# Patient Record
Sex: Male | Born: 1935 | Race: White | Hispanic: No | Marital: Married | Smoking: Former smoker
Health system: Southern US, Community
[De-identification: ages and names within clinical notes are randomized; demographics above are authoritative.]

## PROBLEM LIST (undated history)

## (undated) DIAGNOSIS — I219 Acute myocardial infarction, unspecified: Secondary | ICD-10-CM

## (undated) DIAGNOSIS — I10 Essential (primary) hypertension: Secondary | ICD-10-CM

## (undated) DIAGNOSIS — E119 Type 2 diabetes mellitus without complications: Secondary | ICD-10-CM

## (undated) DIAGNOSIS — I2699 Other pulmonary embolism without acute cor pulmonale: Secondary | ICD-10-CM

## (undated) DIAGNOSIS — I82409 Acute embolism and thrombosis of unspecified deep veins of unspecified lower extremity: Secondary | ICD-10-CM

## (undated) HISTORY — PX: REPLACEMENT TOTAL KNEE BILATERAL: SUR1225

## (undated) HISTORY — PX: VENA CAVA FILTER PLACEMENT: SHX1085

## (undated) HISTORY — PX: CHOLECYSTECTOMY: SHX55

---

## 2012-06-22 ENCOUNTER — Encounter (HOSPITAL_BASED_OUTPATIENT_CLINIC_OR_DEPARTMENT_OTHER): Payer: Self-pay | Admitting: Emergency Medicine

## 2012-06-22 ENCOUNTER — Emergency Department (HOSPITAL_BASED_OUTPATIENT_CLINIC_OR_DEPARTMENT_OTHER)
Admission: EM | Admit: 2012-06-22 | Discharge: 2012-06-22 | Disposition: A | Discharge status: Home / Self Care | Payer: Medicare PPO | Attending: Emergency Medicine | Admitting: Emergency Medicine

## 2012-06-22 ENCOUNTER — Emergency Department (HOSPITAL_BASED_OUTPATIENT_CLINIC_OR_DEPARTMENT_OTHER): Payer: Medicare PPO

## 2012-06-22 DIAGNOSIS — Z87891 Personal history of nicotine dependence: Secondary | ICD-10-CM | POA: Insufficient documentation

## 2012-06-22 DIAGNOSIS — Y939 Activity, unspecified: Secondary | ICD-10-CM | POA: Insufficient documentation

## 2012-06-22 DIAGNOSIS — Z7982 Long term (current) use of aspirin: Secondary | ICD-10-CM | POA: Insufficient documentation

## 2012-06-22 DIAGNOSIS — Y929 Unspecified place or not applicable: Secondary | ICD-10-CM | POA: Insufficient documentation

## 2012-06-22 DIAGNOSIS — S99919A Unspecified injury of unspecified ankle, initial encounter: Secondary | ICD-10-CM | POA: Insufficient documentation

## 2012-06-22 DIAGNOSIS — I10 Essential (primary) hypertension: Secondary | ICD-10-CM | POA: Insufficient documentation

## 2012-06-22 DIAGNOSIS — W19XXXA Unspecified fall, initial encounter: Secondary | ICD-10-CM

## 2012-06-22 DIAGNOSIS — I252 Old myocardial infarction: Secondary | ICD-10-CM | POA: Insufficient documentation

## 2012-06-22 DIAGNOSIS — S8990XA Unspecified injury of unspecified lower leg, initial encounter: Secondary | ICD-10-CM | POA: Insufficient documentation

## 2012-06-22 DIAGNOSIS — W1809XA Striking against other object with subsequent fall, initial encounter: Secondary | ICD-10-CM | POA: Insufficient documentation

## 2012-06-22 DIAGNOSIS — E119 Type 2 diabetes mellitus without complications: Secondary | ICD-10-CM | POA: Insufficient documentation

## 2012-06-22 DIAGNOSIS — S6990XA Unspecified injury of unspecified wrist, hand and finger(s), initial encounter: Secondary | ICD-10-CM | POA: Insufficient documentation

## 2012-06-22 DIAGNOSIS — Z79899 Other long term (current) drug therapy: Secondary | ICD-10-CM | POA: Insufficient documentation

## 2012-06-22 HISTORY — DX: Essential (primary) hypertension: I10

## 2012-06-22 HISTORY — DX: Type 2 diabetes mellitus without complications: E11.9

## 2012-06-22 HISTORY — DX: Acute myocardial infarction, unspecified: I21.9

## 2012-06-22 LAB — PROTIME-INR: Prothrombin Time: 19.9 seconds — ABNORMAL HIGH (ref 11.6–15.2)

## 2012-06-22 IMAGING — CR DG HAND COMPLETE 3+V*L*
3 series · 3 of 3 positions shown · non-contrast
Comparison: None.

CLINICAL DATA: Fell yesterday and injured the left hand, pain
localizing between the thumb and index finger.

LEFT HAND - COMPLETE 3+ VIEW

[x hand pa left]
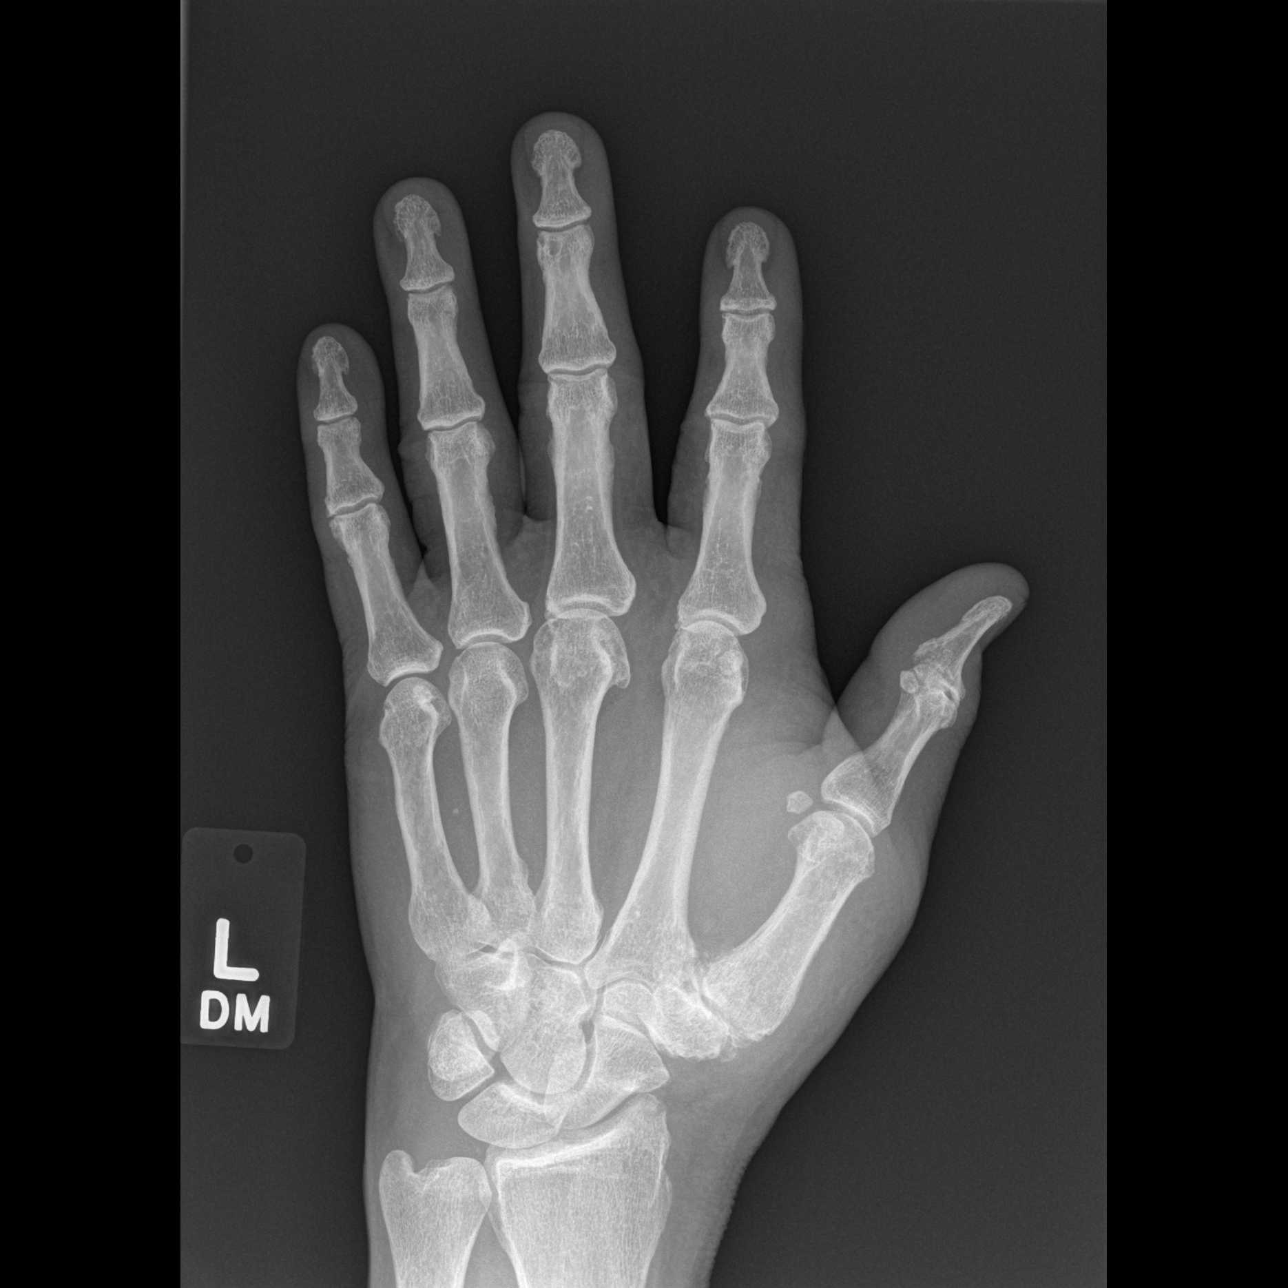

[x hand oblique left]
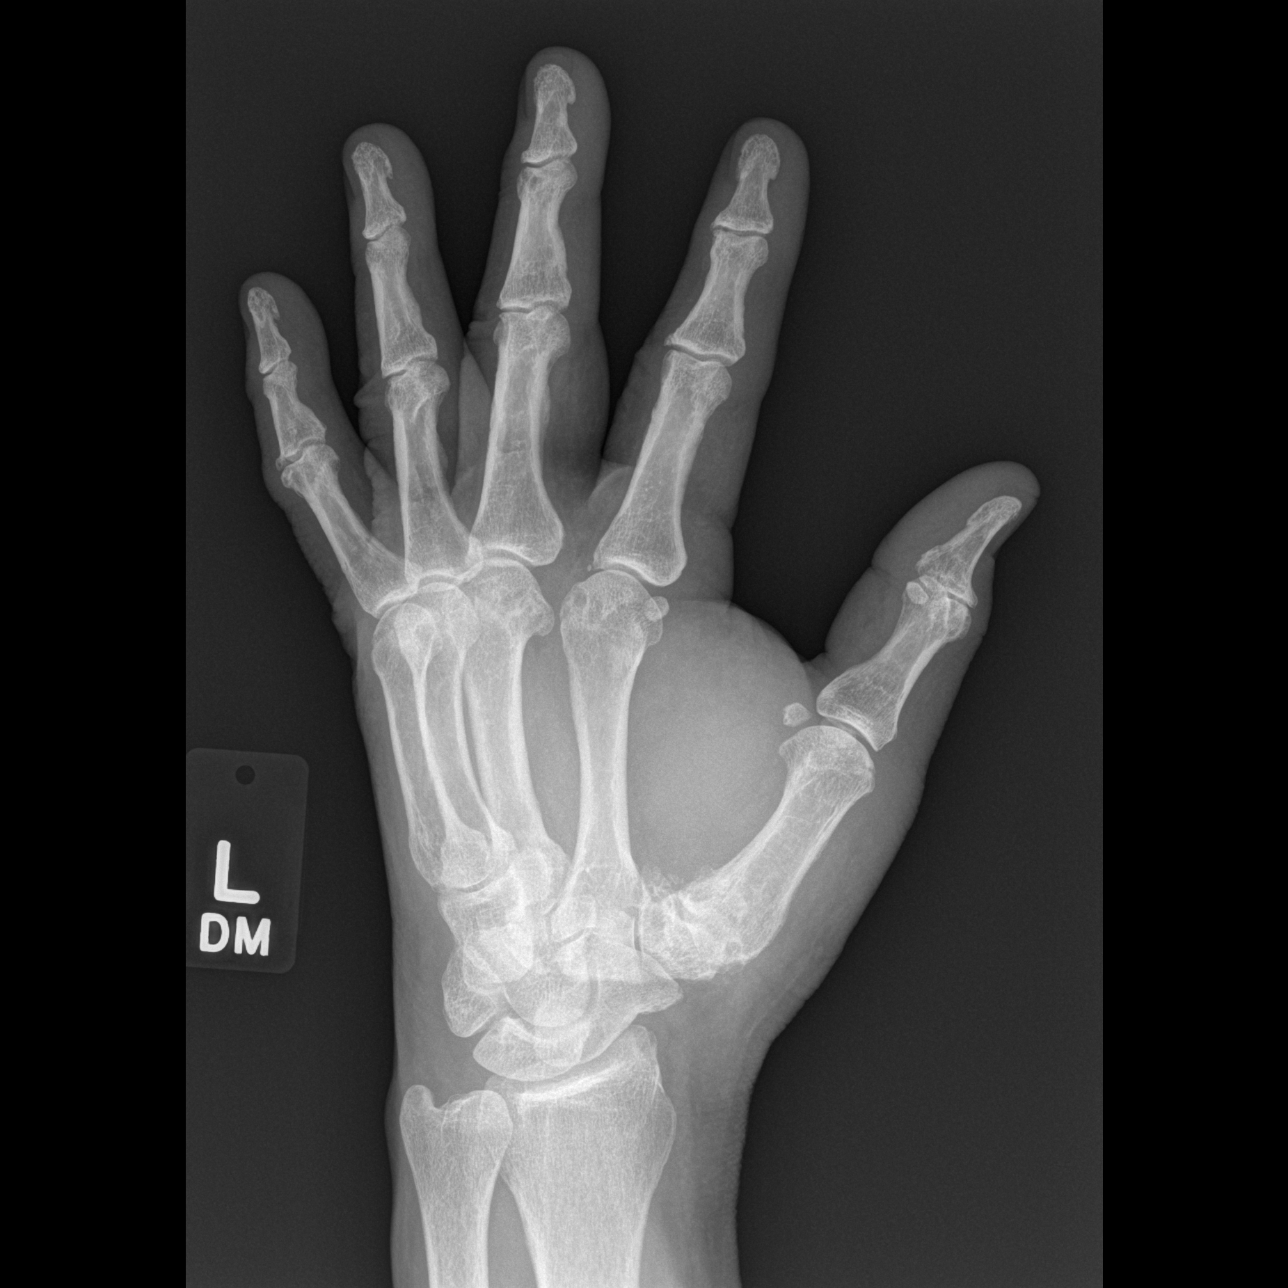

[x hand lat left]
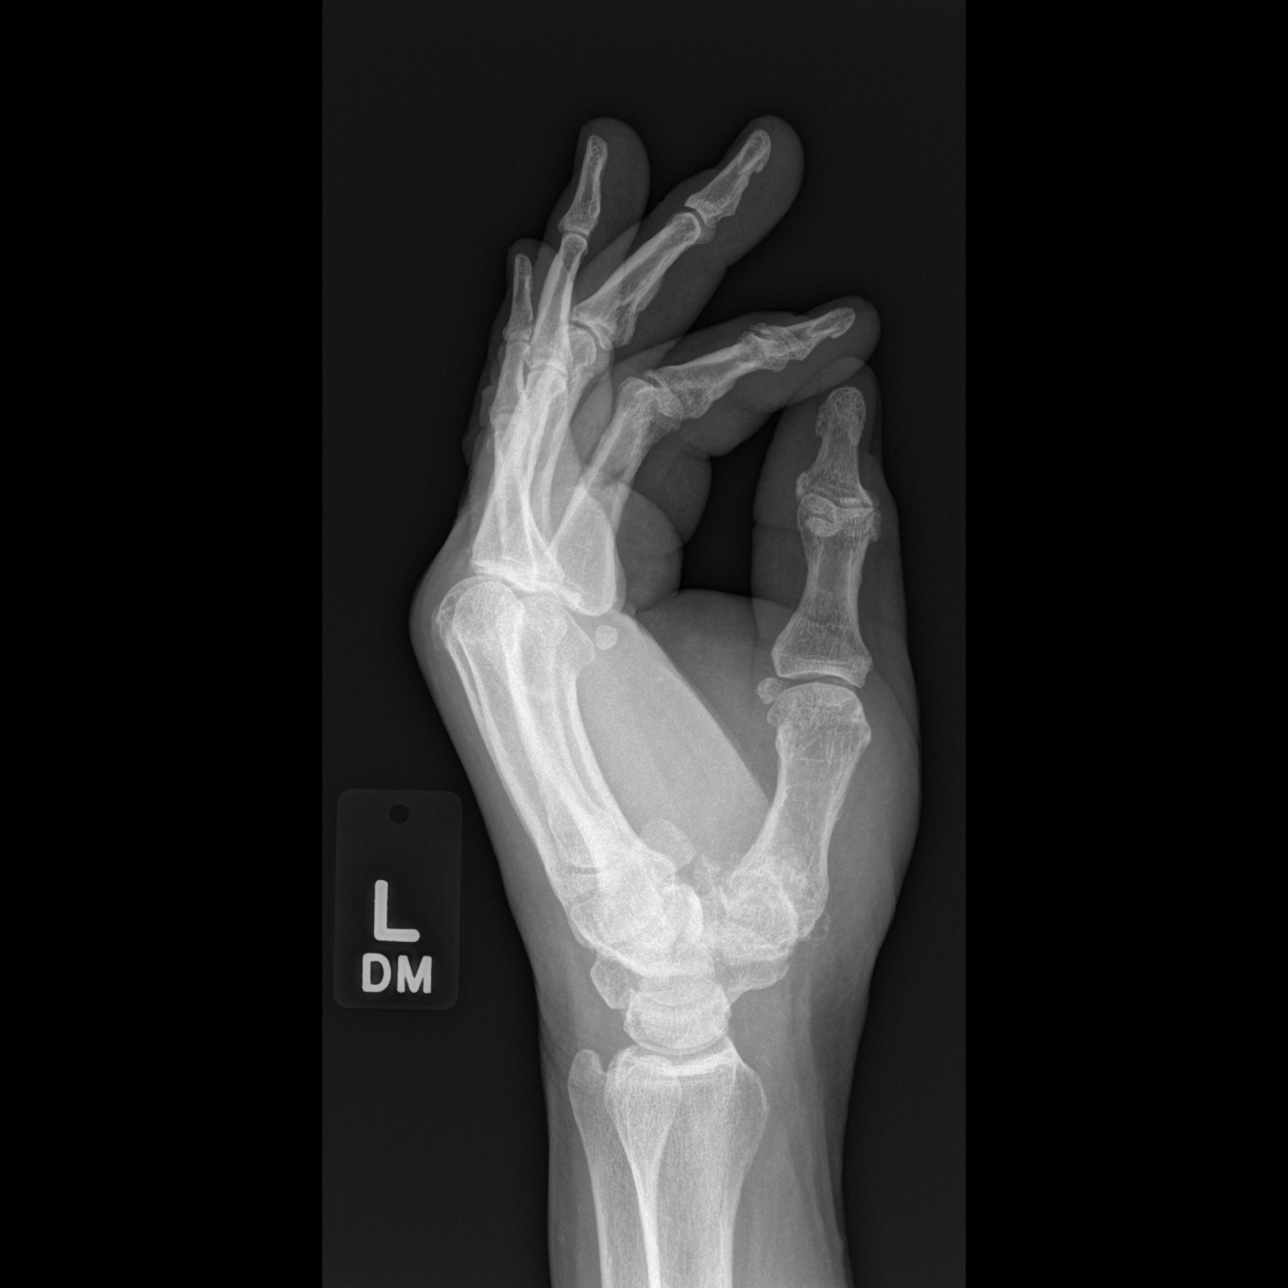

[3 of 3 positions shown; findings below may reference images not displayed]

FINDINGS: Soft tissue swelling involving the hand between the first
and second metacarpals.  No evidence of acute fracture or
dislocation.  Mild joint space narrowing involving multiple IP
joints in the fingers and all of the MTP joints, and severe joint
space narrowing and hypertrophic changes involving the trapezium -
first metacarpal joint in the wrist.  Bone mineral density well
preserved for age.  No opaque foreign bodies in the soft tissues.
IMPRESSION: No acute osseous abnormality.  Mild osteoarthritis involving the
hand and severe osteoarthritis involving the trapezium - first
metacarpal joint of the wrist.  No opaque foreign bodies.

## 2012-06-22 MED ORDER — BACITRACIN 500 UNIT/GM EX OINT
1.0000 "application " | TOPICAL_OINTMENT | Freq: Two times a day (BID) | CUTANEOUS | Status: DC
  Administered 2012-06-22: 1 via TOPICAL
  Filled 2012-06-22: qty 0.9

## 2012-06-22 NOTE — ED Provider Notes (Signed)
History     CSN: [id number]  Arrival date & time 06/22/12  4098   First MD Initiated Contact with Patient 06/22/12 [phone]      Chief Complaint  Patient presents with  . Fall    (Consider location/radiation/quality/duration/timing/severity/associated sxs/prior treatment) HPI Patient tripped and fell on the uneven surface of his driveway yesterday morning. Since the event he complains of swelling to the left hand, associated with mild pain he also suffered abrasions to both hands, left knee and a swollen area to the left pelvis he denies hitting his head denies abdominal pain denies chest pain no other complaint discomfort is minimal. Nonradiating, worse with movement dull in quality Past Medical History  Diagnosis Date  . Diabetes mellitus without complication   . Hypertension   . MI (myocardial infarction)    DVT Past Surgical History  Procedure Date  . Cholecystectomy   . Replacement total knee bilateral     No family history on file.  History  Substance Use Topics  . Smoking status: Former Games developer  . Smokeless tobacco: Not on file  . Alcohol Use: No      Review of Systems  Musculoskeletal: Positive for joint swelling.       Swelling of left hand   Skin: Positive for wound.       Abrasions    Allergies  Review of patient's allergies indicates not on file.  Home Medications   Current Outpatient Rx  Name  Route  Sig  Dispense  Refill  . ALLOPURINOL 300 MG PO TABS   Oral   Take 300 mg by mouth daily.         . ASPIRIN 81 MG PO TABS   Oral   Take 81 mg by mouth daily.         Marland Kitchen CARVEDILOL 3.125 MG PO TABS   Oral   Take 3.125 mg by mouth 2 (two) times daily with a meal.         . FUROSEMIDE 40 MG PO TABS   Oral   Take 40 mg by mouth daily.         Marland Kitchen GLIPIZIDE ER 2.5 MG PO TB24   Oral   Take 2.5 mg by mouth daily.         Marland Kitchen LOSARTAN POTASSIUM 25 MG PO TABS   Oral   Take 25 mg by mouth daily.         Marland Kitchen METFORMIN HCL 1000 MG PO TABS  Oral   Take 1,000 mg by mouth 2 (two) times daily with a meal.         . METOLAZONE 2.5 MG PO TABS   Oral   Take 2.5 mg by mouth daily.         Marland Kitchen POTASSIUM CHLORIDE 20 MEQ PO PACK   Oral   Take 20 mEq by mouth 2 (two) times daily.         Marland Kitchen SIMVASTATIN 20 MG PO TABS   Oral   Take 20 mg by mouth every evening.         . WARFARIN SODIUM 5 MG PO TABS   Oral   Take 5 mg by mouth daily.           BP 116/83  Pulse 93  Temp 98.1 F (36.7 C) (Oral)  Resp 18  SpO2 96%  Physical Exam  Nursing note and vitals reviewed. Constitutional: He is oriented to person, place, and time. He appears well-developed and well-nourished.  HENT:  Head: Normocephalic and atraumatic.  Eyes: Conjunctivae normal are normal. Pupils are equal, round, and reactive to light.  Neck: Neck supple. No tracheal deviation present. No thyromegaly present.  Cardiovascular: Normal rate and regular rhythm.   No murmur heard. Pulmonary/Chest: Effort normal and breath sounds normal.  Abdominal: Soft. Bowel sounds are normal. He exhibits no distension. There is no tenderness.  Musculoskeletal: Normal range of motion. He exhibits no edema and no tenderness.       Left upper extremity swollen and ecchymotic at the thenar eminence. There is a 0.5 cm abrasion to the web space between thumb and forefinger the patient has limited opponens motion of his thumb due to swelling of thenar eminence otherwise full range of motion of hand and wrist pelvis is stable nontender minimal swelling at left iliac crest. Left lower extremity there is a 2 cm abrasion to the anterior knee without swelling or tenderness right upper extremity 0.5 cm abrasion to the palmar surface of the hand otherwise atraumatic. Right lower extremity without contusion abrasion or tenderness neurovascularly intact  Neurological: He is alert and oriented to person, place, and time. Coordination normal.       Gait normal  Skin: Skin is warm and dry. No rash  noted.  Psychiatric: He has a normal mood and affect.    ED Course  Procedures (including critical care time)  Labs Reviewed - No data to display No results found. Results for orders placed during the hospital encounter of 06/22/12  PROTIME-INR      Component Value Range   Prothrombin Time 19.9 (*) 11.6 - 15.2 seconds   INR 1.76 (*) 0.00 - 1.49   Dg Hand Complete Left  06/22/2012  *RADIOLOGY REPORT*  Clinical Data: Larey Seat yesterday and injured the left hand, pain localizing between the thumb and index finger.  LEFT HAND - COMPLETE 3+ VIEW  Comparison: None.  Findings: Soft tissue swelling involving the hand between the first and second metacarpals.  No evidence of acute fracture or dislocation.  Mild joint space narrowing involving multiple IP joints in the fingers and all of the MTP joints, and severe joint space narrowing and hypertrophic changes involving the trapezium - first metacarpal joint in the wrist.  Bone mineral density well preserved for age.  No opaque foreign bodies in the soft tissues.  IMPRESSION: No acute osseous abnormality.  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  No opaque foreign bodies.   Original Report Authenticated By: Hulan Saas, M.D.      No diagnosis found.  Declined pain medicine X-ray reviewed by me MDM  Will not splint presently as I feel that will increase in stiffness in left thumb. Plan bacitracin ointment ice, Tylenol as needed for pain. Follow up with primary care physician if needed for referral to orthopedic surgeon or hand specialist, back to normal in one week Diagnosis #1 fall #2 contusions multiple sites #3 abrasions multiple sites #4 subtherapeutic INR        Doug Sou, MD 06/22/12 1023

## 2012-06-22 NOTE — ED Notes (Signed)
Pt sts he tripped and fell yesterday AM, no LOC, swelling and bruising noted to left hand, CMS intact, denies pain when not moving, no meds pta, NAD

## 2012-06-22 NOTE — ED Notes (Signed)
The patient is undressed and in a gown. The bed is locked and in the lowest position the call light is within reach. The family is at the bedside. The bed rails are up.

## 2014-03-15 ENCOUNTER — Emergency Department (HOSPITAL_BASED_OUTPATIENT_CLINIC_OR_DEPARTMENT_OTHER): Payer: Medicare PPO

## 2014-03-15 ENCOUNTER — Emergency Department (HOSPITAL_BASED_OUTPATIENT_CLINIC_OR_DEPARTMENT_OTHER)
Admission: EM | Admit: 2014-03-15 | Discharge: 2014-03-15 | Disposition: A | Discharge status: Home / Self Care | Payer: Medicare PPO | Attending: Emergency Medicine | Admitting: Emergency Medicine

## 2014-03-15 ENCOUNTER — Encounter (HOSPITAL_BASED_OUTPATIENT_CLINIC_OR_DEPARTMENT_OTHER): Payer: Self-pay | Admitting: Emergency Medicine

## 2014-03-15 DIAGNOSIS — Z87891 Personal history of nicotine dependence: Secondary | ICD-10-CM | POA: Diagnosis not present

## 2014-03-15 DIAGNOSIS — I10 Essential (primary) hypertension: Secondary | ICD-10-CM | POA: Insufficient documentation

## 2014-03-15 DIAGNOSIS — W1809XA Striking against other object with subsequent fall, initial encounter: Secondary | ICD-10-CM | POA: Insufficient documentation

## 2014-03-15 DIAGNOSIS — Z86718 Personal history of other venous thrombosis and embolism: Secondary | ICD-10-CM | POA: Insufficient documentation

## 2014-03-15 DIAGNOSIS — E119 Type 2 diabetes mellitus without complications: Secondary | ICD-10-CM | POA: Insufficient documentation

## 2014-03-15 DIAGNOSIS — Z7982 Long term (current) use of aspirin: Secondary | ICD-10-CM | POA: Insufficient documentation

## 2014-03-15 DIAGNOSIS — S99919A Unspecified injury of unspecified ankle, initial encounter: Secondary | ICD-10-CM | POA: Diagnosis present

## 2014-03-15 DIAGNOSIS — S8990XA Unspecified injury of unspecified lower leg, initial encounter: Secondary | ICD-10-CM | POA: Diagnosis present

## 2014-03-15 DIAGNOSIS — Z96659 Presence of unspecified artificial knee joint: Secondary | ICD-10-CM | POA: Insufficient documentation

## 2014-03-15 DIAGNOSIS — I252 Old myocardial infarction: Secondary | ICD-10-CM | POA: Insufficient documentation

## 2014-03-15 DIAGNOSIS — Y9289 Other specified places as the place of occurrence of the external cause: Secondary | ICD-10-CM | POA: Diagnosis not present

## 2014-03-15 DIAGNOSIS — Y9389 Activity, other specified: Secondary | ICD-10-CM | POA: Diagnosis not present

## 2014-03-15 DIAGNOSIS — Z79899 Other long term (current) drug therapy: Secondary | ICD-10-CM | POA: Insufficient documentation

## 2014-03-15 DIAGNOSIS — S8000XA Contusion of unspecified knee, initial encounter: Secondary | ICD-10-CM | POA: Diagnosis not present

## 2014-03-15 DIAGNOSIS — Z7901 Long term (current) use of anticoagulants: Secondary | ICD-10-CM | POA: Diagnosis not present

## 2014-03-15 DIAGNOSIS — S99929A Unspecified injury of unspecified foot, initial encounter: Secondary | ICD-10-CM

## 2014-03-15 DIAGNOSIS — Z86711 Personal history of pulmonary embolism: Secondary | ICD-10-CM | POA: Insufficient documentation

## 2014-03-15 DIAGNOSIS — S8001XA Contusion of right knee, initial encounter: Secondary | ICD-10-CM

## 2014-03-15 HISTORY — DX: Acute embolism and thrombosis of unspecified deep veins of unspecified lower extremity: I82.409

## 2014-03-15 HISTORY — DX: Other pulmonary embolism without acute cor pulmonale: I26.99

## 2014-03-15 LAB — CBC WITH DIFFERENTIAL/PLATELET
BASOS ABS: 0 10*3/uL (ref 0.0–0.1)
Basophils Relative: 0 % (ref 0–1)
Eosinophils Absolute: 0.1 10*3/uL (ref 0.0–0.7)
Eosinophils Relative: 1 % (ref 0–5)
HEMATOCRIT: 42.4 % (ref 39.0–52.0)
Hemoglobin: 14 g/dL (ref 13.0–17.0)
LYMPHS PCT: 14 % (ref 12–46)
Lymphs Abs: 1.2 10*3/uL (ref 0.7–4.0)
MCH: 31.1 pg (ref 26.0–34.0)
MCHC: 33 g/dL (ref 30.0–36.0)
MCV: 94.2 fL (ref 78.0–100.0)
Monocytes Absolute: 0.6 10*3/uL (ref 0.1–1.0)
Monocytes Relative: 7 % (ref 3–12)
NEUTROS ABS: 6.4 10*3/uL (ref 1.7–7.7)
Neutrophils Relative %: 78 % — ABNORMAL HIGH (ref 43–77)
PLATELETS: 173 10*3/uL (ref 150–400)
RBC: 4.5 MIL/uL (ref 4.22–5.81)
RDW: 15 % (ref 11.5–15.5)
WBC: 8.3 10*3/uL (ref 4.0–10.5)

## 2014-03-15 LAB — BASIC METABOLIC PANEL
Anion gap: 12 (ref 5–15)
BUN: 16 mg/dL (ref 6–23)
CO2: 32 mEq/L (ref 19–32)
Calcium: 9.9 mg/dL (ref 8.4–10.5)
Chloride: 96 mEq/L (ref 96–112)
Creatinine, Ser: 0.8 mg/dL (ref 0.50–1.35)
GFR, EST NON AFRICAN AMERICAN: 84 mL/min — AB (ref >90)
Glucose, Bld: 160 mg/dL — ABNORMAL HIGH (ref 70–99)
POTASSIUM: 3.7 meq/L (ref 3.7–5.3)
SODIUM: 140 meq/L (ref 137–147)

## 2014-03-15 LAB — PROTIME-INR
INR: 2.5 — AB (ref 0.00–1.49)
PROTHROMBIN TIME: 27 s — AB (ref 11.6–15.2)

## 2014-03-15 IMAGING — CR DG KNEE COMPLETE 4+V*R*
4 series · 4 of 4 positions shown · non-contrast
Comparison: None.

CLINICAL DATA: KNEE INJURY

EXAM:
RIGHT KNEE - COMPLETE 4+ VIEW

[t knee ap right]
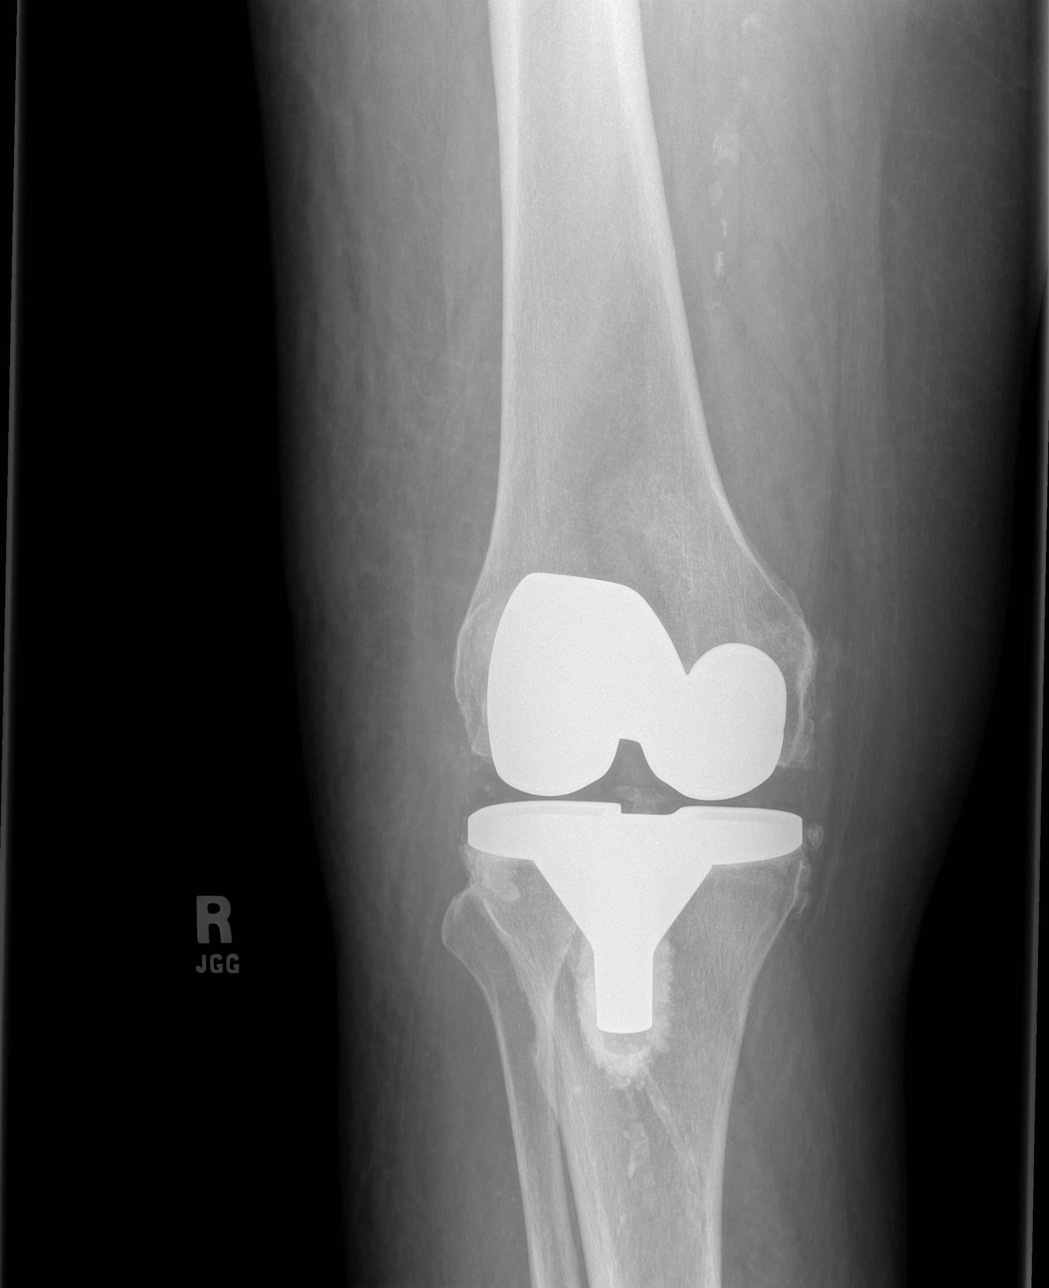

[t knee oblique right (1 of 2)]
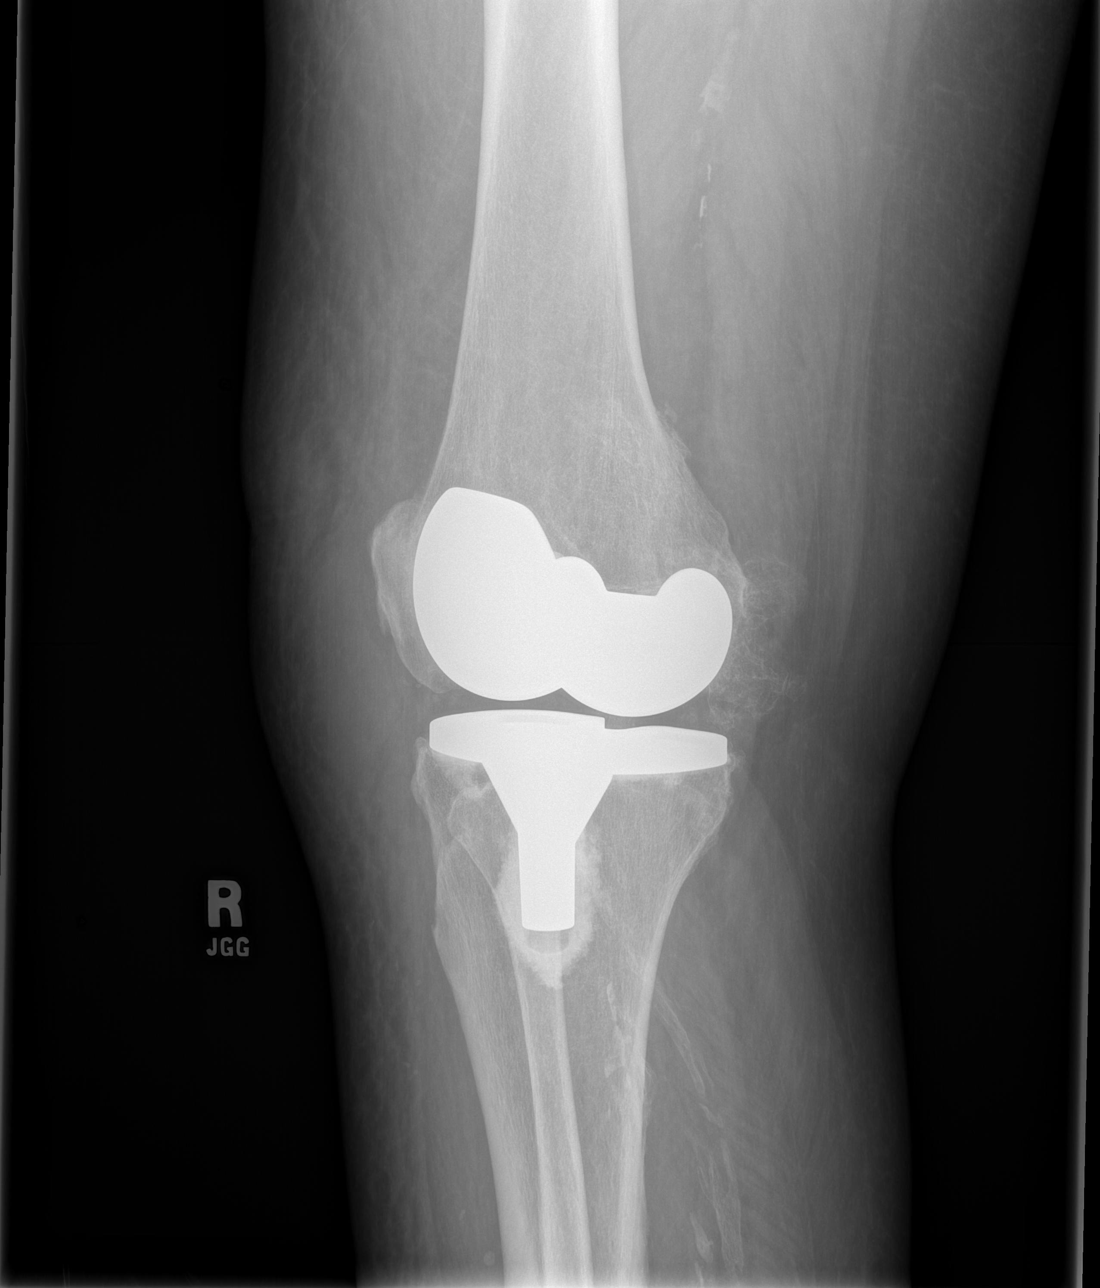

[t knee oblique right (2 of 2)]
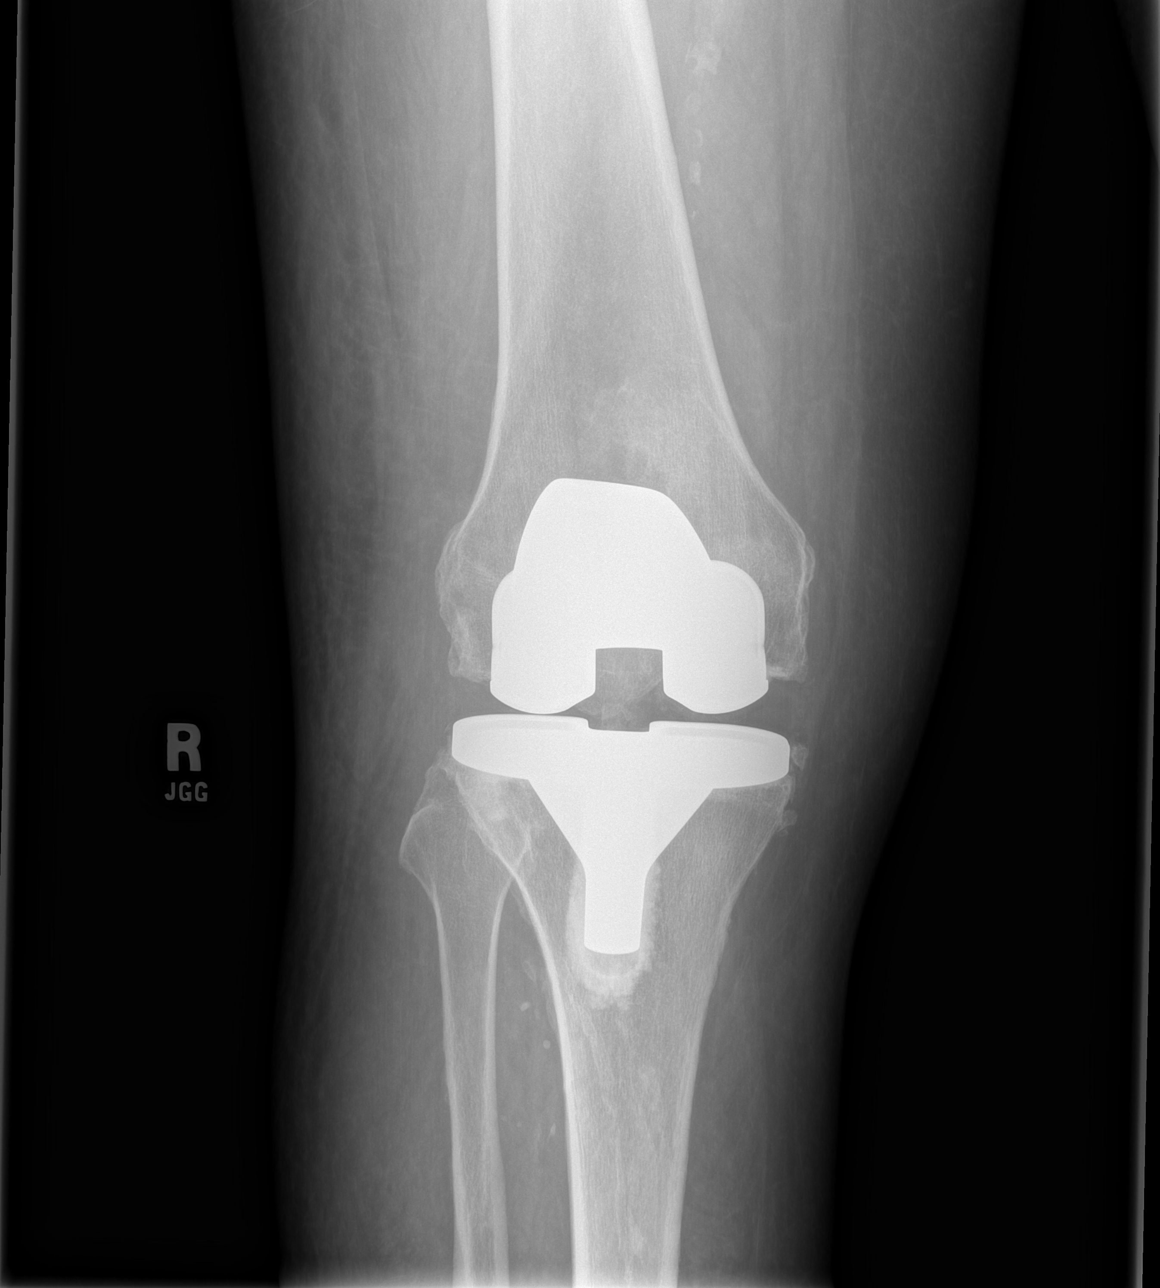

[t knee lat right]
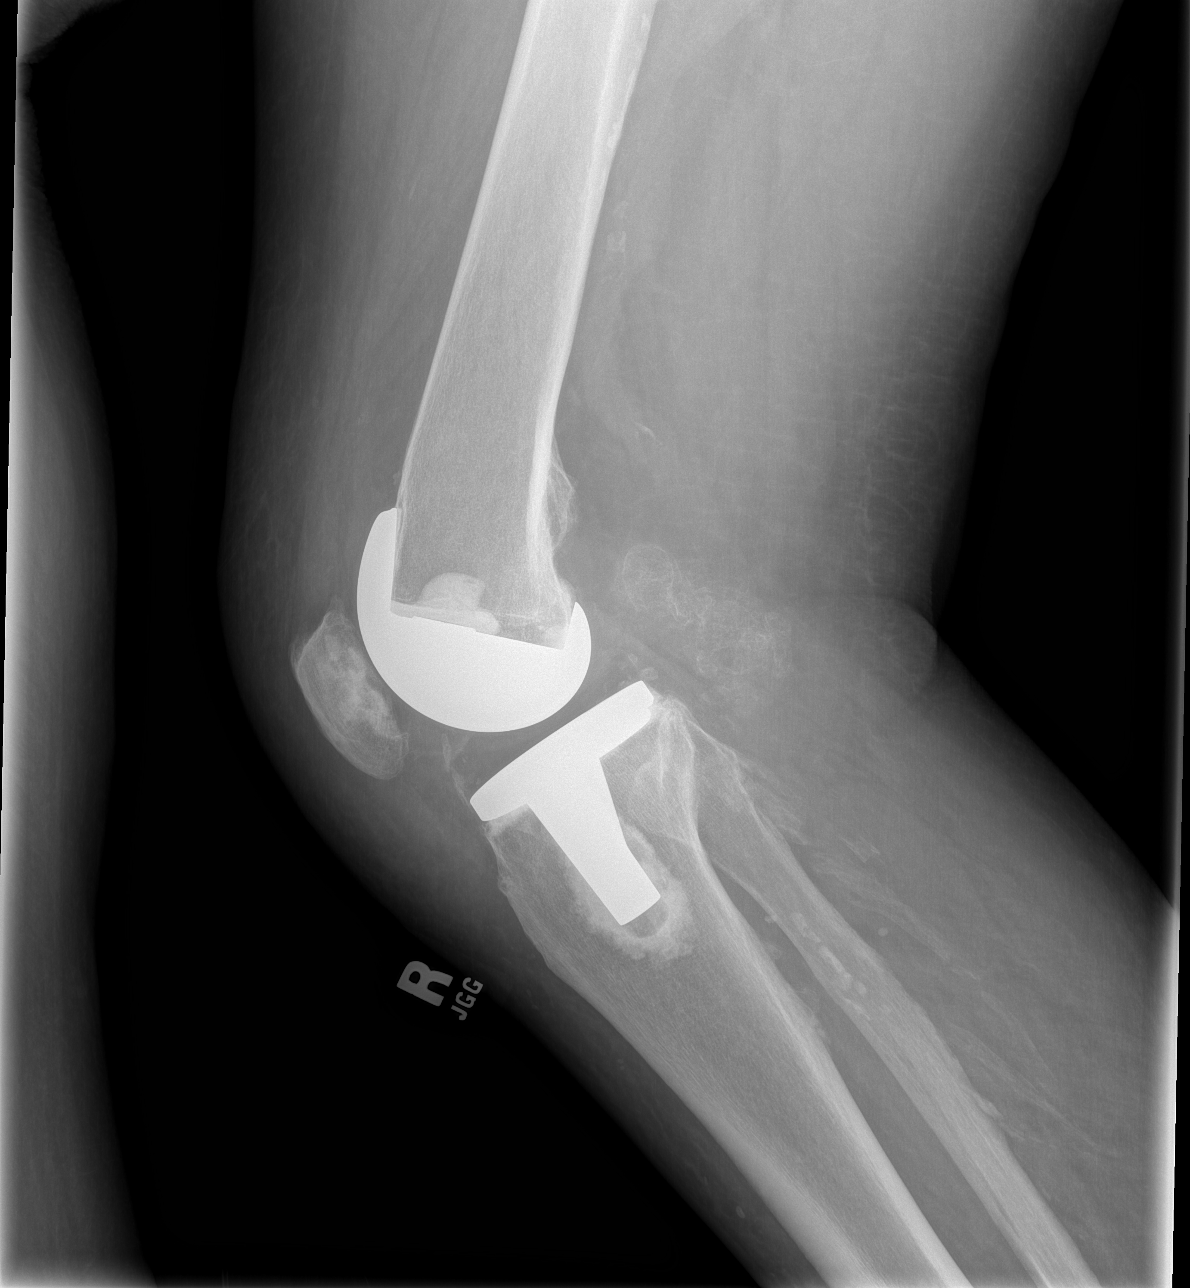

[4 of 4 positions shown; findings below may reference images not displayed]

FINDINGS: Three components of cemented knee arthroplasty project in expected
location without adjacent lucency. Negative for fracture or
dislocation. No definite effusion. Multiple free osteochondral
bodies posterior to the knee. Patchy arterial calcifications.
IMPRESSION: 1. Negative for fracture, dislocation, or other acute bone
abnormality.
2. Previous right TKA, with multiple free osteochondral bodies noted
posteriorly.

## 2014-03-15 MED ORDER — HYDROCODONE-ACETAMINOPHEN 5-325 MG PO TABS: 2.0000 | ORAL_TABLET | ORAL | Status: DC | PRN

## 2014-03-15 NOTE — ED Notes (Signed)
Pts pedal pulses marked on bilateral feet using doppler.

## 2014-03-15 NOTE — Discharge Instructions (Signed)
Contusion Your x-rays are negative. Keep your knee elevated and wrapped. Followup with your doctor this week. Return to the ED with worsening pain and weakness, numbness or any other concerns A contusion is a deep bruise. Contusions are the result of an injury that caused bleeding under the skin. The contusion may turn blue, purple, or yellow. Minor injuries will give you a painless contusion, but more severe contusions may stay painful and swollen for a few weeks.  CAUSES  A contusion is usually caused by a blow, trauma, or direct force to an area of the body. SYMPTOMS   Swelling and redness of the injured area.  Bruising of the injured area.  Tenderness and soreness of the injured area.  Pain. DIAGNOSIS  The diagnosis can be made by taking a history and physical exam. An X-ray, CT scan, or MRI may be needed to determine if there were any associated injuries, such as fractures. TREATMENT  Specific treatment will depend on what area of the body was injured. In general, the best treatment for a contusion is resting, icing, elevating, and applying cold compresses to the injured area. Over-the-counter medicines may also be recommended for pain control. Ask your caregiver what the best treatment is for your contusion. HOME CARE INSTRUCTIONS   Put ice on the injured area.  Put ice in a plastic bag.  Place a towel between your skin and the bag.  Leave the ice on for 15-20 minutes, 3-4 times a day, or as directed by your health care provider.  Only take over-the-counter or prescription medicines for pain, discomfort, or fever as directed by your caregiver. Your caregiver may recommend avoiding anti-inflammatory medicines (aspirin, ibuprofen, and naproxen) for 48 hours because these medicines may increase bruising.  Rest the injured area.  If possible, elevate the injured area to reduce swelling. SEEK IMMEDIATE MEDICAL CARE IF:   You have increased bruising or swelling.  You have pain  that is getting worse.  Your swelling or pain is not relieved with medicines. MAKE SURE YOU:   Understand these instructions.  Will watch your condition.  Will get help right away if you are not doing well or get worse. Document Released: 03/29/2005 Document Revised: 06/24/2013 Document Reviewed: 04/24/2011 [facility] Patient Information 2015 [place], [place]. This information is not intended to replace advice given to you by your health care provider. Make sure you discuss any questions you have with your health care provider.

## 2014-03-15 NOTE — ED Notes (Signed)
Pt ambulated to restroom and back with no problem

## 2014-03-15 NOTE — ED Provider Notes (Addendum)
CSN: [id number]     Arrival date & time 03/15/14  0944 History   First MD Initiated Contact with Patient 03/15/14 1015    This chart was scribed for No att. providers found by Evelene Croon, ED Scribe. This patient was seen in room MHOTF/OTF and the patient's care was started 3:50 PM.  Chief Complaint  Patient presents with  . Knee Injury     The history is provided by the patient.    HPI Comments:  Adrian Foster is a 80 y.o. male who presents to the Emergency Department complaining of constant right knee pain s/p fall on porch about 5 days ago. Pt also reports associated swelling and bruising to the knee that appeared about 2-3 days after the fall. Pt denies head injury and LOC after fall. Also denies lower extremity weakness, extremity numbness/tingling, abdominal pain, and hip pain. Pt has been able to bear weight on the leg since but states pain has worsened since fall. 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 No alleviating factors noted. Pt believes he has had a tetanus shot with in the last five years.   Past Medical History  Diagnosis Date  . Diabetes mellitus without complication   . Hypertension   . MI (myocardial infarction)   . DVT (deep venous thrombosis)   . Pulmonary embolism    Past Surgical History  Procedure Laterality Date  . Cholecystectomy    . Replacement total knee bilateral    . Vena cava filter placement     No family history on file. History  Substance Use Topics  . Smoking status: Former Research scientist (life sciences)  . Smokeless tobacco: Not on file  . Alcohol Use: No    Review of Systems  Musculoskeletal: Positive for joint swelling.       Knee Pain   All other systems reviewed and are negative.     Allergies  Review of patient's allergies indicates no known allergies.  Home Medications   Prior to Admission medications   Medication Sig Start Date End Date Taking? Authorizing Provider  allopurinol (ZYLOPRIM) 300 MG  tablet Take 300 mg by mouth daily.    Historical Provider, MD  aspirin 81 MG tablet Take 81 mg by mouth daily.    Historical Provider, MD  carvedilol (COREG) 3.125 MG tablet Take 3.125 mg by mouth 2 (two) times daily with a meal.    Historical Provider, MD  furosemide (LASIX) 40 MG tablet Take 40 mg by mouth daily.    Historical Provider, MD  glipiZIDE (GLUCOTROL XL) 2.5 MG 24 hr tablet Take 2.5 mg by mouth daily.    Historical Provider, MD  HYDROcodone-acetaminophen (NORCO/VICODIN) 5-325 MG per tablet Take 2 tablets by mouth every 4 (four) hours as needed. 03/15/14   Ezequiel Essex, MD  losartan (COZAAR) 25 MG tablet Take 25 mg by mouth daily.    Historical Provider, MD  metFORMIN (GLUCOPHAGE) 1000 MG tablet Take 1,000 mg by mouth 2 (two) times daily with a meal.    Historical Provider, MD  metolazone (ZAROXOLYN) 2.5 MG tablet Take 2.5 mg by mouth daily.    Historical Provider, MD  potassium chloride (KLOR-CON) 20 MEQ packet Take 20 mEq by mouth 2 (two) times daily.    Historical Provider, MD  simvastatin (ZOCOR) 20 MG tablet Take 20 mg by mouth every evening.    Historical Provider, MD  warfarin (COUMADIN) 5 MG tablet Take 5 mg by mouth daily.    Historical Provider,  MD   BP 114/65  Pulse 76  Temp(Src) 97.5 F (36.4 C) (Oral)  Resp 20  Ht 6\' 4"  (1.93 m)  Wt 315 lb (142.883 kg)  BMI 38.36 kg/m2  SpO2 93% Physical Exam  Nursing note and vitals reviewed. Constitutional: He is oriented to person, place, and time. He appears well-developed and well-nourished. No distress.  HENT:  Head: Normocephalic and atraumatic.  Mouth/Throat: Oropharynx is clear and moist. No oropharyngeal exudate.  Eyes: Conjunctivae and EOM are normal. Pupils are equal, round, and reactive to light.  Neck: Normal range of motion. Neck supple.  No meningismus.  Cardiovascular: Normal rate, regular rhythm and normal heart sounds.   No murmur heard. Difficult to palpate DP pulses.  Found with doppler   Pulmonary/Chest: Effort normal and breath sounds normal. No respiratory distress.  Abdominal: Soft. There is no tenderness. There is no rebound and no guarding.  Musculoskeletal: Normal range of motion. He exhibits edema.  +2 edema up to knee bilaterally- chronic    Neurological: He is alert and oriented to person, place, and time. No cranial nerve deficit. He exhibits normal muscle tone. Coordination normal.  No ataxia on finger to nose bilaterally. No pronator drift. 5/5 strength throughout. CN 2-12 intact. Negative Romberg. Equal grip strength. Sensation intact. Gait is normal.   Skin: Skin is warm.  Right knee abrasion and ecchymosis overlying right patella and right lateral knee. Ecchymosis to right distal lateral thigh. Small effusion with no appreciated ligament laxity. Soft compartments  Psychiatric: He has a normal mood and affect. His behavior is normal.        ED Course  Procedures (including critical care time)  DIAGNOSTIC STUDIES:  Oxygen Saturation is 93% on RA, low by my interpretation.    COORDINATION OF CARE:  10:28 AM Discussed treatment plan with pt at bedside and pt agreed to plan.  Labs Review Labs Reviewed  CBC WITH DIFFERENTIAL - Abnormal; Notable for the following:    Neutrophils Relative % 78 (*)    All other components within normal limits  PROTIME-INR - Abnormal; Notable for the following:    Prothrombin Time 27.0 (*)    INR 2.50 (*)    All other components within normal limits  BASIC METABOLIC PANEL - Abnormal; Notable for the following:    Glucose, Bld 160 (*)    GFR calc non Af Amer 84 (*)    All other components within normal limits    Imaging Review Dg Knee Complete 4 Views Right  03/15/2014   CLINICAL DATA:  KNEE INJURY  EXAM: RIGHT KNEE - COMPLETE 4+ VIEW  COMPARISON:  None.  FINDINGS: Three components of cemented knee arthroplasty project in expected location without adjacent lucency. Negative for fracture or dislocation. No definite  effusion. Multiple free osteochondral bodies posterior to the knee. Patchy arterial calcifications.  IMPRESSION: 1. Negative for fracture, dislocation, or other acute bone abnormality. 2. Previous right TKA, with multiple free osteochondral bodies noted posteriorly.   Electronically Signed   By: Arne Cleveland M.D.   On: 03/15/2014 10:34     EKG Interpretation None      MDM   Final diagnoses:  Knee contusion, right, initial encounter   Right knee pain and swelling after mechanical fall 6 days ago. Denies any other injury. Did not hit head. History of knee replacements bilaterally. On Coumadin. No focal weakness, numbness or tingling.  Ecchymosis and swelling of right knee as above.  X-ray negative for fracture. Range of motion intact. Doubt  septic joint  INR 2.5. Doubt DVT.  Patient instructed on compression of knee joint, he has a cane and walker for weightbearing.  He is able to ambulate. We'll give short supply of pain medication. Followup with PCP and orthopedics.  Return precautions given including risk of compartment syndrome including weakness, numbness, tingling, temperature change, color change or any other concerns.   BP 114/65  Pulse 76  Temp(Src) 97.5 F (36.4 C) (Oral)  Resp 20  Ht 6\' 4"  (1.93 m)  Wt 315 lb (142.883 kg)  BMI 38.36 kg/m2  SpO2 93%    I personally performed the services described in this documentation, which was scribed in my presence. The recorded information has been reviewed and is accurate.   Ezequiel Essex, MD 03/15/14 [place], MD 03/15/14 [phone]

## 2014-03-15 NOTE — ED Notes (Signed)
States that he fell on a concrete porch last week, and now c/o right knee pain and swelling. Abrasions to knee and significant bruising to the outer side of his knee.

## 2014-06-03 ENCOUNTER — Emergency Department (HOSPITAL_BASED_OUTPATIENT_CLINIC_OR_DEPARTMENT_OTHER)
Admission: EM | Admit: 2014-06-03 | Discharge: 2014-06-03 | Disposition: A | Discharge status: Home / Self Care | Payer: Medicare PPO | Attending: Emergency Medicine | Admitting: Emergency Medicine

## 2014-06-03 ENCOUNTER — Encounter (HOSPITAL_BASED_OUTPATIENT_CLINIC_OR_DEPARTMENT_OTHER): Payer: Self-pay

## 2014-06-03 DIAGNOSIS — Z7982 Long term (current) use of aspirin: Secondary | ICD-10-CM | POA: Insufficient documentation

## 2014-06-03 DIAGNOSIS — I252 Old myocardial infarction: Secondary | ICD-10-CM | POA: Insufficient documentation

## 2014-06-03 DIAGNOSIS — Z86711 Personal history of pulmonary embolism: Secondary | ICD-10-CM | POA: Diagnosis not present

## 2014-06-03 DIAGNOSIS — N390 Urinary tract infection, site not specified: Secondary | ICD-10-CM | POA: Diagnosis not present

## 2014-06-03 DIAGNOSIS — E119 Type 2 diabetes mellitus without complications: Secondary | ICD-10-CM | POA: Diagnosis not present

## 2014-06-03 DIAGNOSIS — I10 Essential (primary) hypertension: Secondary | ICD-10-CM | POA: Diagnosis not present

## 2014-06-03 DIAGNOSIS — Z87891 Personal history of nicotine dependence: Secondary | ICD-10-CM | POA: Insufficient documentation

## 2014-06-03 DIAGNOSIS — Z86718 Personal history of other venous thrombosis and embolism: Secondary | ICD-10-CM | POA: Diagnosis not present

## 2014-06-03 DIAGNOSIS — Z79899 Other long term (current) drug therapy: Secondary | ICD-10-CM | POA: Diagnosis not present

## 2014-06-03 DIAGNOSIS — R319 Hematuria, unspecified: Secondary | ICD-10-CM | POA: Diagnosis present

## 2014-06-03 DIAGNOSIS — Z7901 Long term (current) use of anticoagulants: Secondary | ICD-10-CM | POA: Diagnosis not present

## 2014-06-03 LAB — COMPREHENSIVE METABOLIC PANEL
ALBUMIN: 3.5 g/dL (ref 3.5–5.2)
ALK PHOS: 123 U/L — AB (ref 39–117)
ALT: 15 U/L (ref 0–53)
ANION GAP: 14 (ref 5–15)
AST: 22 U/L (ref 0–37)
BILIRUBIN TOTAL: 0.5 mg/dL (ref 0.3–1.2)
BUN: 22 mg/dL (ref 6–23)
CHLORIDE: 96 meq/L (ref 96–112)
CO2: 32 meq/L (ref 19–32)
CREATININE: 0.8 mg/dL (ref 0.50–1.35)
Calcium: 9.8 mg/dL (ref 8.4–10.5)
GFR, EST NON AFRICAN AMERICAN: 83 mL/min — AB (ref >90)
GLUCOSE: 112 mg/dL — AB (ref 70–99)
Potassium: 3.5 mEq/L — ABNORMAL LOW (ref 3.7–5.3)
Sodium: 142 mEq/L (ref 137–147)
Total Protein: 8 g/dL (ref 6.0–8.3)

## 2014-06-03 LAB — CBC WITH DIFFERENTIAL/PLATELET
BASOS PCT: 0 % (ref 0–1)
Basophils Absolute: 0 10*3/uL (ref 0.0–0.1)
Eosinophils Absolute: 0.1 10*3/uL (ref 0.0–0.7)
Eosinophils Relative: 2 % (ref 0–5)
HEMATOCRIT: 46.7 % (ref 39.0–52.0)
HEMOGLOBIN: 14.9 g/dL (ref 13.0–17.0)
LYMPHS ABS: 1.2 10*3/uL (ref 0.7–4.0)
Lymphocytes Relative: 14 % (ref 12–46)
MCH: 30.3 pg (ref 26.0–34.0)
MCHC: 31.9 g/dL (ref 30.0–36.0)
MCV: 94.9 fL (ref 78.0–100.0)
MONO ABS: 0.6 10*3/uL (ref 0.1–1.0)
MONOS PCT: 6 % (ref 3–12)
NEUTROS ABS: 6.6 10*3/uL (ref 1.7–7.7)
Neutrophils Relative %: 78 % — ABNORMAL HIGH (ref 43–77)
Platelets: 187 10*3/uL (ref 150–400)
RBC: 4.92 MIL/uL (ref 4.22–5.81)
RDW: 15 % (ref 11.5–15.5)
WBC: 8.5 10*3/uL (ref 4.0–10.5)

## 2014-06-03 LAB — URINE MICROSCOPIC-ADD ON

## 2014-06-03 LAB — URINALYSIS, ROUTINE W REFLEX MICROSCOPIC
Bilirubin Urine: NEGATIVE
Glucose, UA: NEGATIVE mg/dL
Ketones, ur: NEGATIVE mg/dL
Nitrite: NEGATIVE
PROTEIN: 100 mg/dL — AB
Specific Gravity, Urine: 1.014 (ref 1.005–1.030)
UROBILINOGEN UA: 1 mg/dL (ref 0.0–1.0)
pH: 7 (ref 5.0–8.0)

## 2014-06-03 LAB — PROTIME-INR
INR: 1.97 — ABNORMAL HIGH (ref 0.00–1.49)
PROTHROMBIN TIME: 22.4 s — AB (ref 11.6–15.2)

## 2014-06-03 MED ORDER — CEFTRIAXONE SODIUM 1 G IJ SOLR
INTRAMUSCULAR | Status: AC
  Filled 2014-06-03: qty 10

## 2014-06-03 MED ORDER — DEXTROSE 5 % IV SOLN
1.0000 g | Freq: Once | INTRAVENOUS | Status: AC
  Administered 2014-06-03: 1 g via INTRAVENOUS

## 2014-06-03 MED ORDER — CEPHALEXIN 500 MG PO CAPS: 500.0000 mg | ORAL_CAPSULE | Freq: Four times a day (QID) | ORAL | Status: DC

## 2014-06-03 NOTE — ED Notes (Signed)
C/o "burning" with urination last week-blood in urine today

## 2014-06-03 NOTE — ED Notes (Signed)
Patient asked to provide clean catch urine sample.

## 2014-06-03 NOTE — Discharge Instructions (Signed)
1. Medications: Keflex, usual home medications 2. Treatment: rest, drink plenty of fluids, take medications as prescribed 3. Follow Up: Please followup with your primary doctor in 3 days for discussion of your diagnoses and repeat UA; return to the ER for fevers, persistent vomiting, back pain, abdominal pain or other concerning symptoms.  Hematuria Hematuria is blood in your urine. It can be caused by a bladder infection, kidney infection, prostate infection, kidney stone, or cancer of your urinary tract. Infections can usually be treated with medicine, and a kidney stone usually will pass through your urine. If neither of these is the cause of your hematuria, further workup to find out the reason may be needed. It is very important that you tell your health care provider about any blood you see in your urine, even if the blood stops without treatment or happens without causing pain. Blood in your urine that happens and then stops and then happens again can be a symptom of a very serious condition. Also, pain is not a symptom in the initial stages of many urinary cancers. HOME CARE INSTRUCTIONS   Drink lots of fluid, 3-4 quarts a day. If you have been diagnosed with an infection, cranberry juice is especially recommended, in addition to large amounts of water.  Avoid caffeine, tea, and carbonated beverages because they tend to irritate the bladder.  Avoid alcohol because it may irritate the prostate.  Take all medicines as directed by your health care provider.  If you were prescribed an antibiotic medicine, finish it all even if you start to feel better.  If you have been diagnosed with a kidney stone, follow your health care provider's instructions regarding straining your urine to catch the stone.  Empty your bladder often. Avoid holding urine for long periods of time.  After a bowel movement, women should cleanse front to back. Use each tissue only once.  Empty your bladder before and  after sexual intercourse if you are a male. SEEK MEDICAL CARE IF:  You develop back pain.  You have a fever.  You have a feeling of sickness in your stomach (nausea) or vomiting.  Your symptoms are not better in 3 days. Return sooner if you are getting worse. SEEK IMMEDIATE MEDICAL CARE IF:   You develop severe vomiting and are unable to keep the medicine down.  You develop severe back or abdominal pain despite taking your medicines.  You begin passing a large amount of blood or clots in your urine.  You feel extremely weak or faint, or you pass out. MAKE SURE YOU:   Understand these instructions.  Will watch your condition.  Will get help right away if you are not doing well or get worse. Document Released: 06/19/2005 Document Revised: 11/03/2013 Document Reviewed: 02/17/2013 [facility] Patient Information 2015 [place], [place]. This information is not intended to replace advice given to you by your health care provider. Make sure you discuss any questions you have with your health care provider.

## 2014-06-03 NOTE — ED Provider Notes (Signed)
CSN: [id number]     Arrival date & time 06/03/14  1141 History   First MD Initiated Contact with Patient 06/03/14 1310     Chief Complaint  Patient presents with  . Hematuria     (Consider location/radiation/quality/duration/timing/severity/associated sxs/prior Treatment) Patient is a 80 y.o. male presenting with hematuria. The history is provided by the patient and medical records. No language interpreter was used.  Hematuria Pertinent negatives include no abdominal pain, chest pain, coughing, diaphoresis, fatigue, fever, headaches, nausea, rash or vomiting.     Adrian Foster is a 80 y.o. male  with a hx of NIDDM, MI, HTN, DVT and PE (on chronic anticoagulation) presents to the Emergency Department complaining of one episode of hematuria onset this morning. Associated symptoms include dysuria with todays urination.  He also reports 3-4 days of dysuria last week that resolved spontaneously.  Nothing makes it better and nothing makes it worse. Pt reports he had a UTI and pyelonephritis 8-10 years ago that required hospitalization.  He reports today's symptoms are different in that he does not have any associated pain. Pt denies fever, chills, headache, neck pain, chest pain, SOB, abd pain, N/V/D, weakness, dizziness, syncope, back pain.  Pt reports he is a former smoker (quit in 1997), but smoked 2ppd for 40 years.     Past Medical History  Diagnosis Date  . Diabetes mellitus without complication   . Hypertension   . MI (myocardial infarction)   . DVT (deep venous thrombosis)   . Pulmonary embolism    Past Surgical History  Procedure Laterality Date  . Cholecystectomy    . Replacement total knee bilateral    . Vena cava filter placement     No family history on file. History  Substance Use Topics  . Smoking status: Former Research scientist (life sciences)  . Smokeless tobacco: Not on file  . Alcohol Use: No    Review of Systems  Constitutional: Negative for fever, diaphoresis, appetite change, fatigue  and unexpected weight change.  HENT: Negative for mouth sores.   Eyes: Negative for visual disturbance.  Respiratory: Negative for cough, chest tightness, shortness of breath and wheezing.   Cardiovascular: Negative for chest pain.  Gastrointestinal: Negative for nausea, vomiting, abdominal pain, diarrhea and constipation.  Endocrine: Negative for polydipsia, polyphagia and polyuria.  Genitourinary: Positive for dysuria and hematuria. Negative for urgency and frequency.  Musculoskeletal: Negative for back pain and neck stiffness.  Skin: Negative for rash.  Allergic/Immunologic: Negative for immunocompromised state.  Neurological: Negative for syncope, light-headedness and headaches.  Hematological: Does not bruise/bleed easily.  Psychiatric/Behavioral: Negative for sleep disturbance. The patient is not nervous/anxious.       Allergies  Review of patient's allergies indicates no known allergies.  Home Medications   Prior to Admission medications   Medication Sig Start Date End Date Taking? Authorizing Provider  allopurinol (ZYLOPRIM) 300 MG tablet Take 300 mg by mouth daily.    Historical Provider, MD  aspirin 81 MG tablet Take 81 mg by mouth daily.    Historical Provider, MD  carvedilol (COREG) 3.125 MG tablet Take 3.125 mg by mouth 2 (two) times daily with a meal.    Historical Provider, MD  cephALEXin (KEFLEX) 500 MG capsule Take 1 capsule (500 mg total) by mouth 4 (four) times daily. 06/03/14   Abrham Maslowski, PA-C  furosemide (LASIX) 40 MG tablet Take 40 mg by mouth daily.    Historical Provider, MD  glipiZIDE (GLUCOTROL XL) 2.5 MG 24 hr tablet Take 2.5 mg by mouth  daily.    Historical Provider, MD  losartan (COZAAR) 25 MG tablet Take 25 mg by mouth daily.    Historical Provider, MD  metFORMIN (GLUCOPHAGE) 1000 MG tablet Take 1,000 mg by mouth 2 (two) times daily with a meal.    Historical Provider, MD  metolazone (ZAROXOLYN) 2.5 MG tablet Take 2.5 mg by mouth daily.     Historical Provider, MD  potassium chloride (KLOR-CON) 20 MEQ packet Take 20 mEq by mouth 2 (two) times daily.    Historical Provider, MD  simvastatin (ZOCOR) 20 MG tablet Take 20 mg by mouth every evening.    Historical Provider, MD  warfarin (COUMADIN) 5 MG tablet Take 5 mg by mouth daily.    Historical Provider, MD   BP 130/69 mmHg  Pulse 88  Temp(Src) 98.3 F (36.8 C) (Oral)  Resp 18  Ht 6' 3.5" (1.918 m)  Wt 315 lb (142.883 kg)  BMI 38.84 kg/m2  SpO2 93% Physical Exam  Constitutional: He appears well-developed and well-nourished. No distress.  Awake, alert, nontoxic appearance  HENT:  Head: Normocephalic and atraumatic.  Mouth/Throat: Oropharynx is clear and moist. No oropharyngeal exudate.  Eyes: Conjunctivae are normal. No scleral icterus.  Neck: Normal range of motion. Neck supple.  Cardiovascular: Normal rate, regular rhythm, normal heart sounds and intact distal pulses.   No murmur heard. Pulmonary/Chest: Effort normal and breath sounds normal. No respiratory distress. He has no wheezes.  Equal chest expansion  Abdominal: Soft. Bowel sounds are normal. He exhibits no mass. There is no tenderness. There is no rebound and no guarding.  Musculoskeletal: Normal range of motion. He exhibits no edema.  Neurological: He is alert.  Speech is clear and goal oriented Moves extremities without ataxia  Skin: Skin is warm and dry. He is not diaphoretic.  Psychiatric: He has a normal mood and affect.  Nursing note and vitals reviewed.   ED Course  Procedures (including critical care time) Labs Review Labs Reviewed  URINALYSIS, ROUTINE W REFLEX MICROSCOPIC - Abnormal; Notable for the following:    APPearance CLOUDY (*)    Hgb urine dipstick LARGE (*)    Protein, ur 100 (*)    Leukocytes, UA TRACE (*)    All other components within normal limits  URINE MICROSCOPIC-ADD ON - Abnormal; Notable for the following:    Bacteria, UA FEW (*)    All other components within normal  limits  CBC WITH DIFFERENTIAL - Abnormal; Notable for the following:    Neutrophils Relative % 78 (*)    All other components within normal limits  COMPREHENSIVE METABOLIC PANEL - Abnormal; Notable for the following:    Potassium 3.5 (*)    Glucose, Bld 112 (*)    Alkaline Phosphatase 123 (*)    GFR calc non Af Amer 83 (*)    All other components within normal limits  PROTIME-INR - Abnormal; Notable for the following:    Prothrombin Time 22.4 (*)    INR 1.97 (*)    All other components within normal limits  URINE CULTURE    Imaging Review No results found.   EKG Interpretation None      MDM   Final diagnoses:  UTI (lower urinary tract infection)  Hematuria   [name] presents with dysuria, hematuria.  Pt has been diagnosed with a UTI. Pt is afebrile, no CVA tenderness, normotensive, and denies N/V; no evidence of pyelonephritis. Patient given Rocephin here in the emergency department. INR 1.7 today. Patient without evidence of renal failure,  B1 22 and creatinine 0.8.   Pt to be dc home with antibiotics and instructions to follow up with PCP for repeat UA. Discussed with patient my concern about potential for painless hematuria and it suggestion of potential bladder cancer. They report understanding of my concerns and agreed to follow-up.  I have personally reviewed patient's vitals, nursing note and any pertinent labs or imaging.  I performed an undressed physical exam.    It has been determined that no acute conditions requiring further emergency intervention are present at this time. The patient/guardian have been advised of the diagnosis and plan. I reviewed all labs and imaging including any potential incidental findings. We have discussed signs and symptoms that warrant return to the ED and they are listed in the discharge instructions.    Vital signs are stable at discharge.   BP 130/69 mmHg  Pulse 88  Temp(Src) 98.3 F (36.8 C) (Oral)  Resp 18  Ht 6' 3.5"  (1.918 m)  Wt 315 lb (142.883 kg)  BMI 38.84 kg/m2  SpO2 93%   The patient was discussed with and seen by Dr. Doy Mince who agrees with the treatment plan.        Abigail Butts, PA-C 06/03/14 [place], PA-C 06/03/14 1455  Artis Delay, MD 06/03/14 1726

## 2014-06-04 LAB — URINE CULTURE
Colony Count: NO GROWTH
Culture: NO GROWTH

## 2015-05-28 ENCOUNTER — Emergency Department (HOSPITAL_BASED_OUTPATIENT_CLINIC_OR_DEPARTMENT_OTHER)
Admission: EM | Admit: 2015-05-28 | Discharge: 2015-05-28 | Disposition: A | Discharge status: Home / Self Care | Payer: Medicare Other | Attending: Emergency Medicine | Admitting: Emergency Medicine

## 2015-05-28 ENCOUNTER — Encounter (HOSPITAL_BASED_OUTPATIENT_CLINIC_OR_DEPARTMENT_OTHER): Payer: Self-pay | Admitting: *Deleted

## 2015-05-28 DIAGNOSIS — Y998 Other external cause status: Secondary | ICD-10-CM | POA: Insufficient documentation

## 2015-05-28 DIAGNOSIS — Z7982 Long term (current) use of aspirin: Secondary | ICD-10-CM | POA: Diagnosis not present

## 2015-05-28 DIAGNOSIS — Z86718 Personal history of other venous thrombosis and embolism: Secondary | ICD-10-CM | POA: Insufficient documentation

## 2015-05-28 DIAGNOSIS — S81001A Unspecified open wound, right knee, initial encounter: Secondary | ICD-10-CM | POA: Insufficient documentation

## 2015-05-28 DIAGNOSIS — I10 Essential (primary) hypertension: Secondary | ICD-10-CM | POA: Diagnosis not present

## 2015-05-28 DIAGNOSIS — Z86711 Personal history of pulmonary embolism: Secondary | ICD-10-CM | POA: Insufficient documentation

## 2015-05-28 DIAGNOSIS — Z87891 Personal history of nicotine dependence: Secondary | ICD-10-CM | POA: Insufficient documentation

## 2015-05-28 DIAGNOSIS — Y92193 Bedroom in other specified residential institution as the place of occurrence of the external cause: Secondary | ICD-10-CM | POA: Insufficient documentation

## 2015-05-28 DIAGNOSIS — Z7901 Long term (current) use of anticoagulants: Secondary | ICD-10-CM | POA: Insufficient documentation

## 2015-05-28 DIAGNOSIS — Y9389 Activity, other specified: Secondary | ICD-10-CM | POA: Diagnosis not present

## 2015-05-28 DIAGNOSIS — W19XXXA Unspecified fall, initial encounter: Secondary | ICD-10-CM

## 2015-05-28 DIAGNOSIS — I252 Old myocardial infarction: Secondary | ICD-10-CM | POA: Diagnosis not present

## 2015-05-28 DIAGNOSIS — W01198A Fall on same level from slipping, tripping and stumbling with subsequent striking against other object, initial encounter: Secondary | ICD-10-CM | POA: Diagnosis not present

## 2015-05-28 DIAGNOSIS — E119 Type 2 diabetes mellitus without complications: Secondary | ICD-10-CM | POA: Diagnosis not present

## 2015-05-28 DIAGNOSIS — Z79899 Other long term (current) drug therapy: Secondary | ICD-10-CM | POA: Diagnosis not present

## 2015-05-28 DIAGNOSIS — Z23 Encounter for immunization: Secondary | ICD-10-CM | POA: Insufficient documentation

## 2015-05-28 DIAGNOSIS — S59911A Unspecified injury of right forearm, initial encounter: Secondary | ICD-10-CM | POA: Diagnosis present

## 2015-05-28 DIAGNOSIS — S51811A Laceration without foreign body of right forearm, initial encounter: Secondary | ICD-10-CM

## 2015-05-28 DIAGNOSIS — S51801A Unspecified open wound of right forearm, initial encounter: Secondary | ICD-10-CM | POA: Insufficient documentation

## 2015-05-28 MED ORDER — TETANUS-DIPHTH-ACELL PERTUSSIS 5-2.5-18.5 LF-MCG/0.5 IM SUSP
0.5000 mL | Freq: Once | INTRAMUSCULAR | Status: AC
  Administered 2015-05-28: 0.5 mL via INTRAMUSCULAR
  Filled 2015-05-28: qty 0.5

## 2015-05-28 MED ORDER — ACETAMINOPHEN 500 MG PO TABS
1000.0000 mg | ORAL_TABLET | Freq: Once | ORAL | Status: AC
  Administered 2015-05-28: 1000 mg via ORAL
  Filled 2015-05-28: qty 2

## 2015-05-28 NOTE — Discharge Instructions (Signed)

## 2015-05-28 NOTE — ED Notes (Signed)
Pt reports getting up to use the bathroom at 4am and missing one of the two steps down into his bedroom, hitting his right arm on a dresser, and his right knee on tile. Denies head injury or loc. Denies any other injuries or c/o.

## 2015-05-28 NOTE — ED Provider Notes (Signed)
CSN: [id number]     Arrival date & time 05/28/15  5465 History   First MD Initiated Contact with Patient 05/28/15 1021     Chief Complaint  Patient presents with  . Fall     (Consider location/radiation/quality/duration/timing/severity/associated sxs/prior Treatment) Patient is a 81 y.o. male presenting with fall. The history is provided by the patient.  Fall This is a new problem. The current episode started less than 1 hour ago. The problem occurs constantly. The problem has not changed since onset.Pertinent negatives include no chest pain, no abdominal pain, no headaches and no shortness of breath. Nothing aggravates the symptoms. Nothing relieves the symptoms. He has tried nothing for the symptoms. The treatment provided no relief.   81 yo M with a chief complaint of a fall. Patient missed one of the steps when he was going down to the bathroom. Fell onto his right knee and his right arm. Patient having significant skin tears to those areas. Patient is on Coumadin and had some continued bleeding that the wife is concerned about. He denies loss of consciousness neck pain headache chest pain trouble breathing abdominal pain.   Past Medical History  Diagnosis Date  . Diabetes mellitus without complication ([place])   . Hypertension   . MI (myocardial infarction) ([place])   . DVT (deep venous thrombosis) ([place])   . Pulmonary embolism [facility])    Past Surgical History  Procedure Laterality Date  . Cholecystectomy    . Replacement total knee bilateral    . Vena cava filter placement     History reviewed. No pertinent family history. Social History  Substance Use Topics  . Smoking status: Former Research scientist (life sciences)  . Smokeless tobacco: None  . Alcohol Use: No    Review of Systems  Constitutional: Negative for fever and chills.  HENT: Negative for congestion and facial swelling.   Eyes: Negative for discharge and visual disturbance.  Respiratory: Negative for shortness of breath.   Cardiovascular:  Negative for chest pain and palpitations.  Gastrointestinal: Negative for vomiting, abdominal pain and diarrhea.  Musculoskeletal: Negative for myalgias and arthralgias.  Skin: Positive for wound. Negative for color change and rash.  Neurological: Negative for tremors, syncope and headaches.  Psychiatric/Behavioral: Negative for confusion and dysphoric mood.      Allergies  Review of patient's allergies indicates no known allergies.  Home Medications   Prior to Admission medications   Medication Sig Start Date End Date Taking? Authorizing Provider  torsemide (DEMADEX) 10 MG tablet Take 10 mg by mouth daily.   Yes Historical Provider, MD  allopurinol (ZYLOPRIM) 300 MG tablet Take 300 mg by mouth daily.    Historical Provider, MD  aspirin 81 MG tablet Take 81 mg by mouth daily.    Historical Provider, MD  carvedilol (COREG) 3.125 MG tablet Take 3.125 mg by mouth 2 (two) times daily with a meal.    Historical Provider, MD  glipiZIDE (GLUCOTROL XL) 2.5 MG 24 hr tablet Take 2.5 mg by mouth daily.    Historical Provider, MD  losartan (COZAAR) 25 MG tablet Take 25 mg by mouth daily.    Historical Provider, MD  metFORMIN (GLUCOPHAGE) 1000 MG tablet Take 1,000 mg by mouth 2 (two) times daily with a meal.    Historical Provider, MD  metolazone (ZAROXOLYN) 2.5 MG tablet Take 2.5 mg by mouth daily.    Historical Provider, MD  potassium chloride (KLOR-CON) 20 MEQ packet Take 20 mEq by mouth 2 (two) times daily.    Historical Provider,  MD  simvastatin (ZOCOR) 20 MG tablet Take 20 mg by mouth every evening.    Historical Provider, MD  warfarin (COUMADIN) 5 MG tablet Take 5 mg by mouth daily.    Historical Provider, MD   BP 129/76 mmHg  Pulse 79  Temp(Src) 98 F (36.7 C) (Oral)  Resp 18  Ht '6\' 3"'$  (1.905 m)  Wt 315 lb (142.883 kg)  BMI 39.37 kg/m2  SpO2 99% Physical Exam  Constitutional: He is oriented to person, place, and time. He appears well-developed and well-nourished.  HENT:  Head:  Normocephalic and atraumatic.  Eyes: EOM are normal. Pupils are equal, round, and reactive to light.  Neck: Normal range of motion. Neck supple. No JVD present.  Cardiovascular: Normal rate and regular rhythm.  Exam reveals no gallop and no friction rub.   No murmur heard. Pulmonary/Chest: No respiratory distress. He has no wheezes.  Abdominal: He exhibits no distension. There is no rebound and no guarding.  Musculoskeletal: Normal range of motion.  Neurological: He is alert and oriented to person, place, and time.  Skin: No rash noted. No pallor.     Psychiatric: He has a normal mood and affect. His behavior is normal.  Nursing note and vitals reviewed.   ED Course  Procedures (including critical care time) Labs Review Labs Reviewed - No data to display  Imaging Review No results found. I have personally reviewed and evaluated these images and lab results as part of my medical decision-making.   EKG Interpretation None      MDM   Final diagnoses:  Fall, initial encounter  Skin tear of forearm without complication, right, initial encounter    81 yo M with a chief complaint of a fall. Patient having no bony tenderness discussed risks and benefits of imaging. Patient currently declining x-rays. Unable to approximate the wound secondary to avulsions except for the most distal of the right forearm. Will like Steri-Strips on that one. Local wound care applied. PCP follow-up.  2:03 PM:  I have discussed the diagnosis/risks/treatment options with the patient and family and believe the pt to be eligible for discharge home to follow-up with PCP. We also discussed returning to the ED immediately if new or worsening sx occur. We discussed the sx which are most concerning (e.g., sudden worsening pain, fever, inability to tolerate by mouth) that necessitate immediate return. Medications administered to the patient during their visit and any new prescriptions provided to the patient are  listed below.  Medications given during this visit Medications  Tdap (BOOSTRIX) injection 0.5 mL (0.5 mLs Intramuscular Given 05/28/15 1118)  acetaminophen (TYLENOL) tablet 1,000 mg (1,000 mg Oral Given 05/28/15 1117)    Discharge Medication List as of 05/28/2015 12:14 PM      The patient appears reasonably screen and/or stabilized for discharge and I doubt any other medical condition or other [facility] requiring further screening, evaluation, or treatment in the ED at this time prior to discharge.      Deno Etienne, DO 05/28/15 [phone]

## 2015-10-14 ENCOUNTER — Emergency Department (HOSPITAL_BASED_OUTPATIENT_CLINIC_OR_DEPARTMENT_OTHER)
Admission: EM | Admit: 2015-10-14 | Discharge: 2015-10-14 | Disposition: A | Discharge status: Home / Self Care | Payer: Medicare Other | Attending: Emergency Medicine | Admitting: Emergency Medicine

## 2015-10-14 ENCOUNTER — Encounter (HOSPITAL_BASED_OUTPATIENT_CLINIC_OR_DEPARTMENT_OTHER): Payer: Self-pay

## 2015-10-14 DIAGNOSIS — E119 Type 2 diabetes mellitus without complications: Secondary | ICD-10-CM | POA: Insufficient documentation

## 2015-10-14 DIAGNOSIS — Y999 Unspecified external cause status: Secondary | ICD-10-CM | POA: Diagnosis not present

## 2015-10-14 DIAGNOSIS — Y9389 Activity, other specified: Secondary | ICD-10-CM | POA: Diagnosis not present

## 2015-10-14 DIAGNOSIS — I10 Essential (primary) hypertension: Secondary | ICD-10-CM | POA: Insufficient documentation

## 2015-10-14 DIAGNOSIS — S61412A Laceration without foreign body of left hand, initial encounter: Secondary | ICD-10-CM | POA: Diagnosis not present

## 2015-10-14 DIAGNOSIS — Z87891 Personal history of nicotine dependence: Secondary | ICD-10-CM | POA: Insufficient documentation

## 2015-10-14 DIAGNOSIS — W1839XA Other fall on same level, initial encounter: Secondary | ICD-10-CM | POA: Insufficient documentation

## 2015-10-14 DIAGNOSIS — S60511A Abrasion of right hand, initial encounter: Secondary | ICD-10-CM | POA: Insufficient documentation

## 2015-10-14 DIAGNOSIS — Y92096 Garden or yard of other non-institutional residence as the place of occurrence of the external cause: Secondary | ICD-10-CM | POA: Diagnosis not present

## 2015-10-14 DIAGNOSIS — S80211A Abrasion, right knee, initial encounter: Secondary | ICD-10-CM | POA: Insufficient documentation

## 2015-10-14 DIAGNOSIS — I252 Old myocardial infarction: Secondary | ICD-10-CM | POA: Diagnosis not present

## 2015-10-14 MED ORDER — LIDOCAINE HCL (PF) 1 % IJ SOLN
5.0000 mL | Freq: Once | INTRAMUSCULAR | Status: DC
  Filled 2015-10-14: qty 5

## 2015-10-14 NOTE — Discharge Instructions (Signed)
Laceration Care, Adult  A laceration is a cut that goes through all layers of the skin. The cut also goes into the tissue that is right under the skin. Some cuts heal on their own. Others need to be closed with stitches (sutures), staples, skin adhesive strips, or wound glue. Taking care of your cut lowers your risk of infection and helps your cut to heal better.  HOW TO TAKE CARE OF YOUR CUT  For stitches or staples:  · Keep the wound clean and dry.  · If you were given a bandage (dressing), you should change it at least one time per day or as told by your doctor. You should also change it if it gets wet or dirty.  · Keep the wound completely dry for the first 24 hours or as told by your doctor. After that time, you may take a shower or a bath. However, make sure that the wound is not soaked in water until after the stitches or staples have been removed.  · Clean the wound one time each day or as told by your doctor:    Wash the wound with soap and water.    Rinse the wound with water until all of the soap comes off.    Pat the wound dry with a clean towel. Do not rub the wound.  · After you clean the wound, put a thin layer of antibiotic ointment on it as told by your doctor. This ointment:    Helps to prevent infection.    Keeps the bandage from sticking to the wound.  · Have your stitches or staples removed as told by your doctor.  If your doctor used skin adhesive strips:   · Keep the wound clean and dry.  · If you were given a bandage, you should change it at least one time per day or as told by your doctor. You should also change it if it gets dirty or wet.  · Do not get the skin adhesive strips wet. You can take a shower or a bath, but be careful to keep the wound dry.  · If the wound gets wet, pat it dry with a clean towel. Do not rub the wound.  · Skin adhesive strips fall off on their own. You can trim the strips as the wound heals. Do not remove any strips that are still stuck to the wound. They will  fall off after a while.  If your doctor used wound glue:  · Try to keep your wound dry, but you may briefly wet it in the shower or bath. Do not soak the wound in water, such as by swimming.  · After you take a shower or a bath, gently pat the wound dry with a clean towel. Do not rub the wound.  · Do not do any activities that will make you really sweaty until the skin glue has fallen off on its own.  · Do not apply liquid, cream, or ointment medicine to your wound while the skin glue is still on.  · If you were given a bandage, you should change it at least one time per day or as told by your doctor. You should also change it if it gets dirty or wet.  · If a bandage is placed over the wound, do not let the tape for the bandage touch the skin glue.  · Do not pick at the glue. The skin glue usually stays on for 5-10 days. Then, it   falls off of the skin.  General Instructions   · To help prevent scarring, make sure to cover your wound with sunscreen whenever you are outside after stitches are removed, after adhesive strips are removed, or when wound glue stays in place and the wound is healed. Make sure to wear a sunscreen of at least 30 SPF.  · Take over-the-counter and prescription medicines only as told by your doctor.  · If you were given antibiotic medicine or ointment, take or apply it as told by your doctor. Do not stop using the antibiotic even if your wound is getting better.  · Do not scratch or pick at the wound.  · Keep all follow-up visits as told by your doctor. This is important.  · Check your wound every day for signs of infection. Watch for:    Redness, swelling, or pain.    Fluid, blood, or pus.  · Raise (elevate) the injured area above the level of your heart while you are sitting or lying down, if possible.  GET HELP IF:  · You got a tetanus shot and you have any of these problems at the injection site:    Swelling.    Very bad pain.    Redness.    Bleeding.  · You have a fever.  · A wound that was  closed breaks open.  · You notice a bad smell coming from your wound or your bandage.  · You notice something coming out of the wound, such as wood or glass.  · Medicine does not help your pain.  · You have more redness, swelling, or pain at the site of your wound.  · You have fluid, blood, or pus coming from your wound.  · You notice a change in the color of your skin near your wound.  · You need to change the bandage often because fluid, blood, or pus is coming from the wound.  · You start to have a new rash.  · You start to have numbness around the wound.  GET HELP RIGHT AWAY IF:  · You have very bad swelling around the wound.  · Your pain suddenly gets worse and is very bad.  · You notice painful lumps near the wound or on skin that is anywhere on your body.  · You have a red streak going away from your wound.  · The wound is on your hand or foot and you cannot move a finger or toe like you usually can.  · The wound is on your hand or foot and you notice that your fingers or toes look pale or bluish.     This information is not intended to replace advice given to you by your health care provider. Make sure you discuss any questions you have with your health care provider.     Document Released: 12/06/2007 Document Revised: 11/03/2014 Document Reviewed: 06/15/2014  [employer]vier Interactive Patient Education ©2016 [employer]

## 2015-10-14 NOTE — ED Notes (Signed)
Injuries from garden tiller-lac to left hand web, abrasion to right hand and right knee-NAD-steady gait

## 2015-10-14 NOTE — ED Provider Notes (Signed)
CSN: [id number]     Arrival date & time 10/14/15  1826 History  By signing my name below, I, [facility], attest that this documentation has been prepared under the direction and in the presence of Deno Etienne, DO. Electronically Signed: Virgel Bouquet, ED Scribe. 10/14/2015. 9:54 PM.   Chief Complaint  Patient presents with  . Extremity Laceration   Patient is a 82 y.o. male presenting with skin laceration. The history is provided by the patient. No language interpreter was used.  Laceration Location:  Hand Hand laceration location:  L hand Length (cm):  3.5 cm Bleeding: controlled   Laceration mechanism:  Metal edge Pain details:    Severity:  No pain   Progression:  Unchanged Foreign body present:  No foreign bodies Ineffective treatments:  None tried Tetanus status:  Up to date HPI Comments: Adrian Foster is a 82 y.o. male who presents to the Emergency Department complaining of a mild, not actively bleeding left hand laceration to the web of his left hand that occurred shortly PTA. Patient reports that he was tilling his garden when the tiller unexpectedly sped up, jerking him forward, causing him to fall to the ground, and lacerating his left hand web and scraping his right hand and right knee. Tetanus UTD. Denies leg pain, knee pain, elbow pain, arm pain. Denies numbness, weakness, paraesthesia, and any other symptoms currently.  Past Medical History  Diagnosis Date  . Diabetes mellitus without complication ([place])   . Hypertension   . MI (myocardial infarction) ([place])   . DVT (deep venous thrombosis) ([place])   . Pulmonary embolism [facility])    Past Surgical History  Procedure Laterality Date  . Cholecystectomy    . Replacement total knee bilateral    . Vena cava filter placement     No family history on file. Social History  Substance Use Topics  . Smoking status: Former Research scientist (life sciences)  . Smokeless tobacco: None  . Alcohol Use: No    Review of Systems  Constitutional:  Negative for fever and chills.  HENT: Negative for congestion and facial swelling.   Eyes: Negative for discharge and visual disturbance.  Respiratory: Negative for shortness of breath.   Cardiovascular: Negative for chest pain and palpitations.  Gastrointestinal: Negative for vomiting, abdominal pain and diarrhea.  Musculoskeletal: Negative for myalgias and arthralgias.  Skin: Positive for wound (multiple abrasions and one lac). Negative for color change and rash.  Neurological: Negative for tremors, syncope and headaches.  Psychiatric/Behavioral: Negative for confusion and dysphoric mood.      Allergies  Review of patient's allergies indicates no known allergies.  Home Medications   Prior to Admission medications   Medication Sig Start Date End Date Taking? Authorizing Provider  allopurinol (ZYLOPRIM) 300 MG tablet Take 300 mg by mouth daily.    Historical Provider, MD  aspirin 81 MG tablet Take 81 mg by mouth daily.    Historical Provider, MD  carvedilol (COREG) 3.125 MG tablet Take 3.125 mg by mouth 2 (two) times daily with a meal.    Historical Provider, MD  glipiZIDE (GLUCOTROL XL) 2.5 MG 24 hr tablet Take 2.5 mg by mouth daily.    Historical Provider, MD  losartan (COZAAR) 25 MG tablet Take 25 mg by mouth daily.    Historical Provider, MD  metFORMIN (GLUCOPHAGE) 1000 MG tablet Take 1,000 mg by mouth 2 (two) times daily with a meal.    Historical Provider, MD  metolazone (ZAROXOLYN) 2.5 MG tablet Take 2.5 mg by mouth daily.  Historical Provider, MD  potassium chloride (KLOR-CON) 20 MEQ packet Take 20 mEq by mouth 2 (two) times daily.    Historical Provider, MD  simvastatin (ZOCOR) 20 MG tablet Take 20 mg by mouth every evening.    Historical Provider, MD  torsemide (DEMADEX) 10 MG tablet Take 10 mg by mouth daily.    Historical Provider, MD  warfarin (COUMADIN) 5 MG tablet Take 5 mg by mouth daily.    Historical Provider, MD   BP 104/66 mmHg  Pulse 58  Temp(Src) 98 F (36.7  C) (Oral)  Resp 18  Ht '6\' 3"'$  (1.905 m)  Wt 316 lb (143.337 kg)  BMI 39.50 kg/m2  SpO2 97% Physical Exam  Constitutional: He is oriented to person, place, and time. He appears well-developed and well-nourished.  HENT:  Head: Normocephalic and atraumatic. Head is without raccoon's eyes, without Battle's sign, without abrasion, without contusion, without laceration, without right periorbital erythema and without left periorbital erythema.  No signs of trauma to the head.   Eyes: EOM are normal. Pupils are equal, round, and reactive to light.  Neck: Normal range of motion. Neck supple. No JVD present.  No signs of trauma noted to the neck.  Cardiovascular: Normal rate and regular rhythm.  Exam reveals no gallop and no friction rub.   No murmur heard. Pulmonary/Chest: No respiratory distress. He has no wheezes. He exhibits no tenderness.  No noted chest wall tenderness.  Abdominal: He exhibits no distension. There is no tenderness. There is no rebound and no guarding.  No noted abdominal tenderness.  Musculoskeletal: Normal range of motion.  Neurological: He is alert and oriented to person, place, and time.  Skin: No rash noted. No pallor.  3.5 cm laceeration to the web of his left hand. Couple of superficial abrasions to top the of the dorsal apset of right hand. Superfical scrape to right knee.  Psychiatric: He has a normal mood and affect. His behavior is normal.  Nursing note and vitals reviewed.   ED Course  .Marland KitchenLaceration Repair Date/Time: 10/14/2015 9:52 PM Performed by: Tyrone Nine Jkwon Treptow Authorized by: Deno Etienne Consent: Verbal consent obtained. Risks and benefits: risks, benefits and alternatives were discussed Consent given by: patient Required items: required blood products, implants, devices, and special equipment available Patient identity confirmed: verbally with patient Time out: Immediately prior to procedure a "time out" was called to verify the correct patient, procedure,  equipment, support staff and site/side marked as required. Body area: upper extremity Location details: left hand Tendon involvement: none Nerve involvement: none Vascular damage: no Anesthesia: local infiltration Local anesthetic: lidocaine 1% without epinephrine Anesthetic total: 5 ml Patient sedated: no Preparation: Patient was prepped and draped in the usual sterile fashion. Irrigation solution: saline Irrigation method: syringe Amount of cleaning: extensive Debridement: minimal Degree of undermining: none Skin closure: 4-0 nylon Number of sutures: 4 Technique: simple Approximation: close Approximation difficulty: simple Dressing: 4x4 sterile gauze, antibiotic ointment and gauze roll Patient tolerance: Patient tolerated the procedure well with no immediate complications     DIAGNOSTIC STUDIES: Oxygen Saturation is 95% on RA, adequate by my interpretation.    COORDINATION OF CARE: 8:28 PM Injected pt with lidocaine. Will return to perform laceration repair. Discussed treatment plan with pt at bedside and pt agreed to plan.    MDM   Final diagnoses:  Hand laceration, left, initial encounter    82 yo M With a chief complaints of a hand injury. Patient was selling his garden when it jumped up and  he fell to the ground. He is not sure of the exact events however denies head injury loss of consciousness or neck pain. Patient had some scrapes over his body and a laceration to the web space of his left hand. This was repaired at bedside. Discharged patient home. Follow-up in 10-14 days for suture removal.  9:54 PM:  I have discussed the diagnosis/risks/treatment options with the patient and family and believe the pt to be eligible for discharge home to follow-up with PCP. We also discussed returning to the ED immediately if new or worsening sx occur. We discussed the sx which are most concerning (e.g., sudden worsening pain, fever, inability to tolerate by mouth) that  necessitate immediate return. Medications administered to the patient during their visit and any new prescriptions provided to the patient are listed below.  Medications given during this visit Medications  lidocaine (PF) (XYLOCAINE) 1 % injection 5 mL (not administered)    Discharge Medication List as of 10/14/2015  9:35 PM      The patient appears reasonably screen and/or stabilized for discharge and I doubt any other medical condition or other [facility] requiring further screening, evaluation, or treatment in the ED at this time prior to discharge.    I personally performed the services described in this documentation, which was scribed in my presence. The recorded information has been reviewed and is accurate.    Deno Etienne, DO 10/14/15 2154

## 2018-05-08 ENCOUNTER — Encounter (HOSPITAL_BASED_OUTPATIENT_CLINIC_OR_DEPARTMENT_OTHER): Payer: Self-pay

## 2018-05-08 ENCOUNTER — Emergency Department (HOSPITAL_BASED_OUTPATIENT_CLINIC_OR_DEPARTMENT_OTHER): Payer: Medicare Other

## 2018-05-08 ENCOUNTER — Emergency Department (HOSPITAL_BASED_OUTPATIENT_CLINIC_OR_DEPARTMENT_OTHER)
Admission: EM | Admit: 2018-05-08 | Discharge: 2018-05-08 | Disposition: A | Discharge status: Home / Self Care | Payer: Medicare Other | Attending: Emergency Medicine | Admitting: Emergency Medicine

## 2018-05-08 DIAGNOSIS — S80212A Abrasion, left knee, initial encounter: Secondary | ICD-10-CM | POA: Diagnosis not present

## 2018-05-08 DIAGNOSIS — Z7982 Long term (current) use of aspirin: Secondary | ICD-10-CM | POA: Insufficient documentation

## 2018-05-08 DIAGNOSIS — Y999 Unspecified external cause status: Secondary | ICD-10-CM | POA: Insufficient documentation

## 2018-05-08 DIAGNOSIS — W010XXA Fall on same level from slipping, tripping and stumbling without subsequent striking against object, initial encounter: Secondary | ICD-10-CM | POA: Insufficient documentation

## 2018-05-08 DIAGNOSIS — I252 Old myocardial infarction: Secondary | ICD-10-CM | POA: Diagnosis not present

## 2018-05-08 DIAGNOSIS — Z87891 Personal history of nicotine dependence: Secondary | ICD-10-CM | POA: Diagnosis not present

## 2018-05-08 DIAGNOSIS — E119 Type 2 diabetes mellitus without complications: Secondary | ICD-10-CM | POA: Diagnosis not present

## 2018-05-08 DIAGNOSIS — W19XXXA Unspecified fall, initial encounter: Secondary | ICD-10-CM

## 2018-05-08 DIAGNOSIS — Z96653 Presence of artificial knee joint, bilateral: Secondary | ICD-10-CM | POA: Diagnosis not present

## 2018-05-08 DIAGNOSIS — Z7984 Long term (current) use of oral hypoglycemic drugs: Secondary | ICD-10-CM | POA: Insufficient documentation

## 2018-05-08 DIAGNOSIS — Y939 Activity, unspecified: Secondary | ICD-10-CM | POA: Diagnosis not present

## 2018-05-08 DIAGNOSIS — I10 Essential (primary) hypertension: Secondary | ICD-10-CM | POA: Diagnosis not present

## 2018-05-08 DIAGNOSIS — S80211A Abrasion, right knee, initial encounter: Secondary | ICD-10-CM | POA: Diagnosis not present

## 2018-05-08 DIAGNOSIS — S0181XA Laceration without foreign body of other part of head, initial encounter: Secondary | ICD-10-CM | POA: Insufficient documentation

## 2018-05-08 DIAGNOSIS — S0990XA Unspecified injury of head, initial encounter: Secondary | ICD-10-CM | POA: Diagnosis present

## 2018-05-08 DIAGNOSIS — S51811A Laceration without foreign body of right forearm, initial encounter: Secondary | ICD-10-CM | POA: Diagnosis not present

## 2018-05-08 DIAGNOSIS — Z7901 Long term (current) use of anticoagulants: Secondary | ICD-10-CM | POA: Insufficient documentation

## 2018-05-08 DIAGNOSIS — S80219A Abrasion, unspecified knee, initial encounter: Secondary | ICD-10-CM

## 2018-05-08 DIAGNOSIS — Y92014 Private driveway to single-family (private) house as the place of occurrence of the external cause: Secondary | ICD-10-CM | POA: Diagnosis not present

## 2018-05-08 IMAGING — CT CT HEAD W/O CM
3 series · 15 of 47 positions shown, 18 images · non-contrast
Comparison: None.

CLINICAL DATA: Trip and fall injury. Laceration and abrasion to the
right temporal area. No loss of consciousness.

EXAM:
CT HEAD WITHOUT CONTRAST
TECHNIQUE: Contiguous axial images were obtained from the base of the skull
through the vertex without intravenous contrast.

[Series 2: head wo · axial · 0.46mm/px · z∈[-188,-48]mm · 9 of 34 slices shown, 12 images]
[im 3/34  brain]
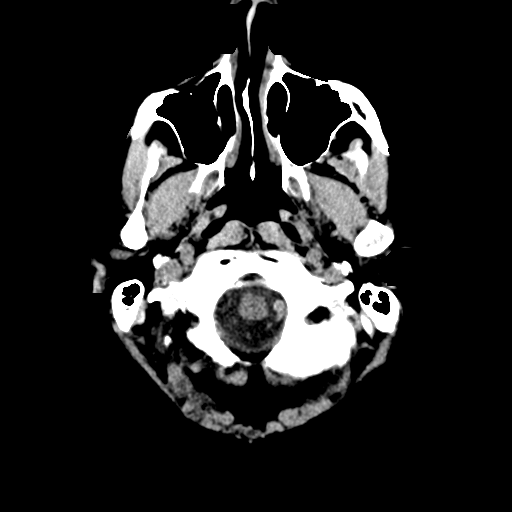
[im 3/34  bone]
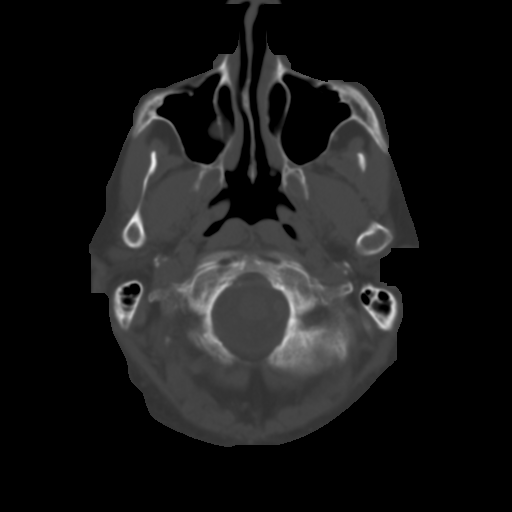
[im 6/34  brain]
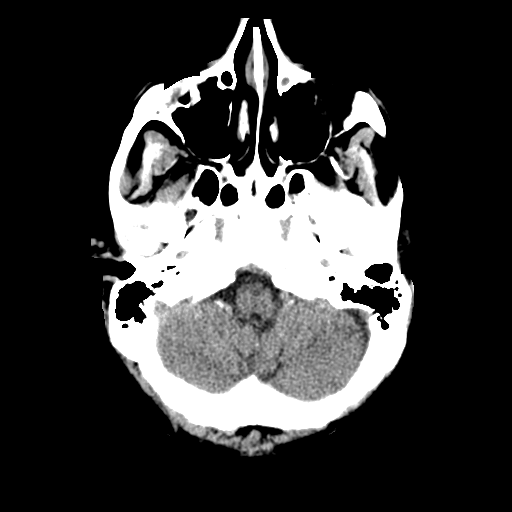
[im 10/34  brain]
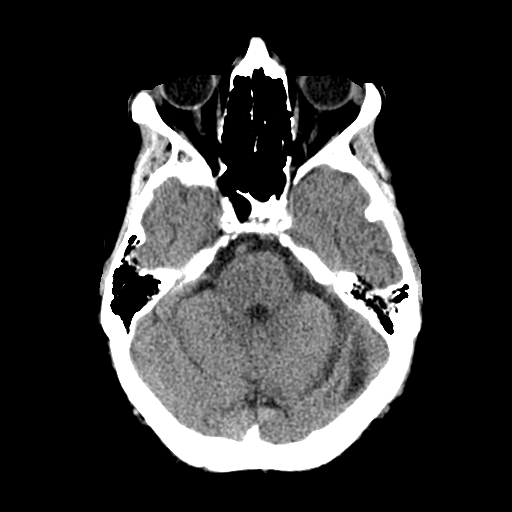
[im 13/34  brain]
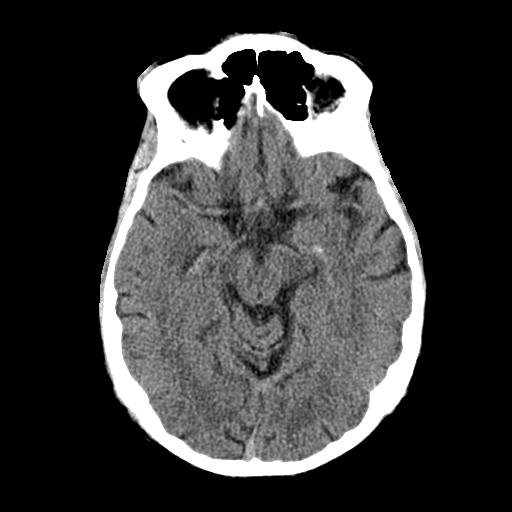
[im 18/34  brain]
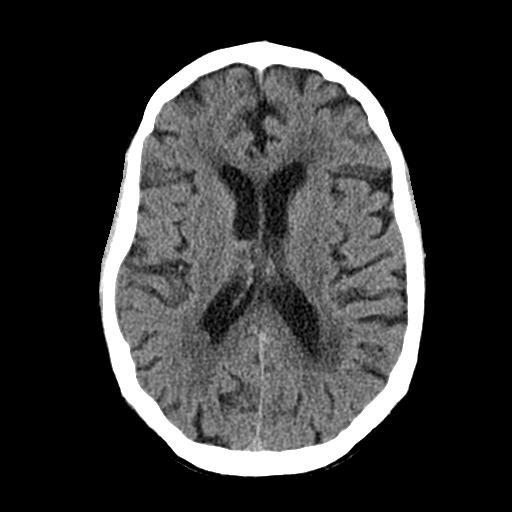
[im 18/34  bone]
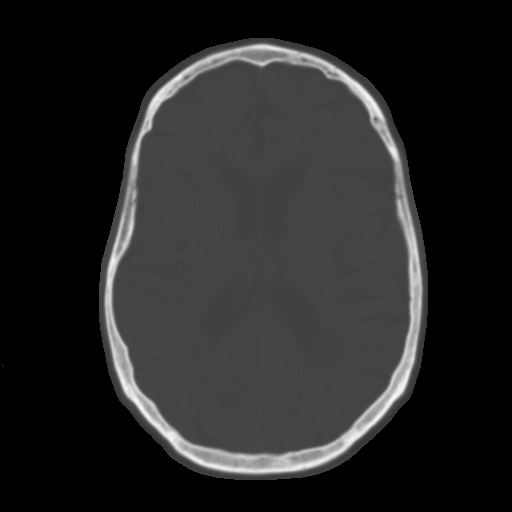
[im 21/34  brain]
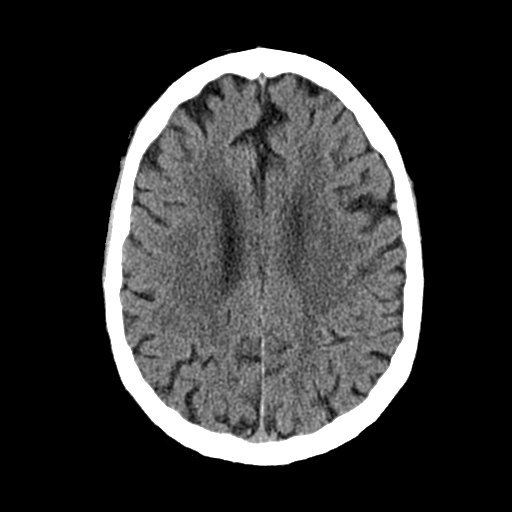
[im 24/34  brain]
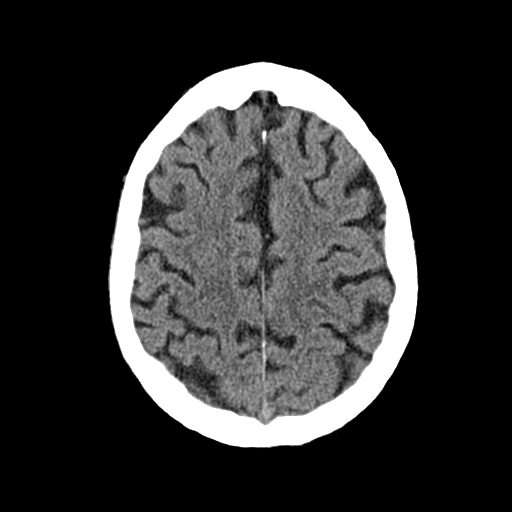
[im 28/34  brain]
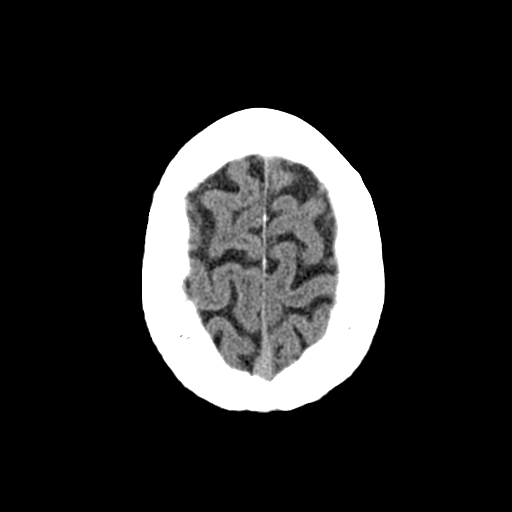
[im 31/34  brain]
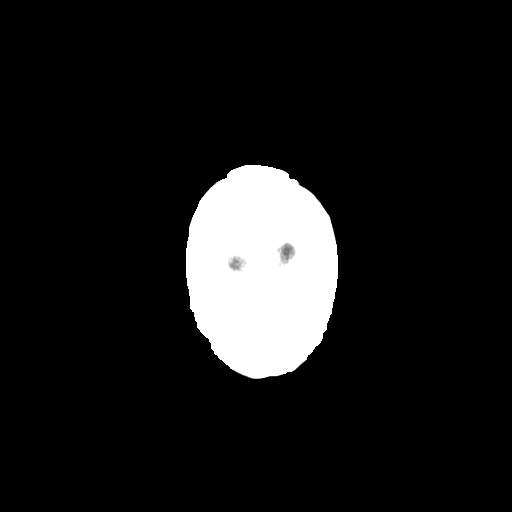
[im 31/34  bone]
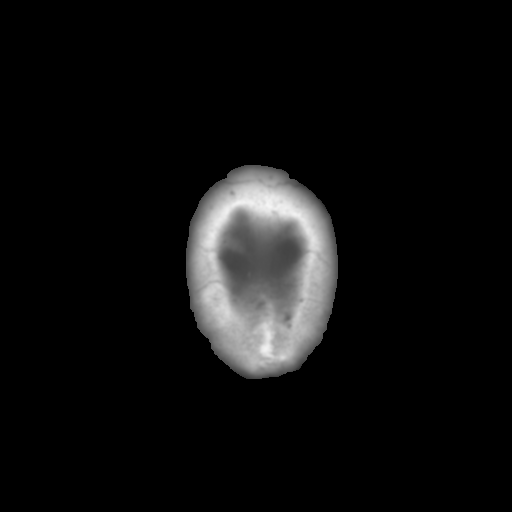

[Series 4: cor soft · coronal · 0.33mm/px · 3 of 84 slices shown]
[im 28/84  brain]
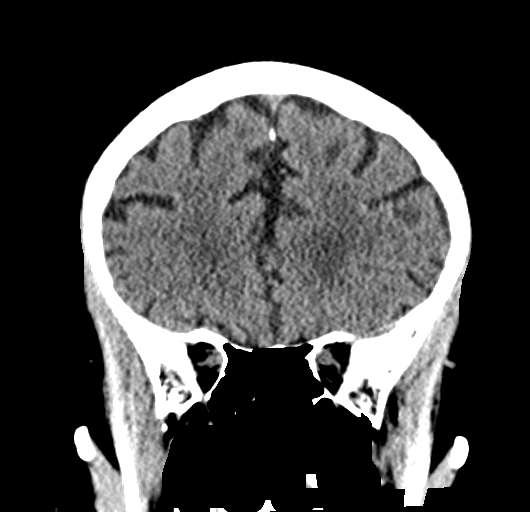
[im 37/84  brain]
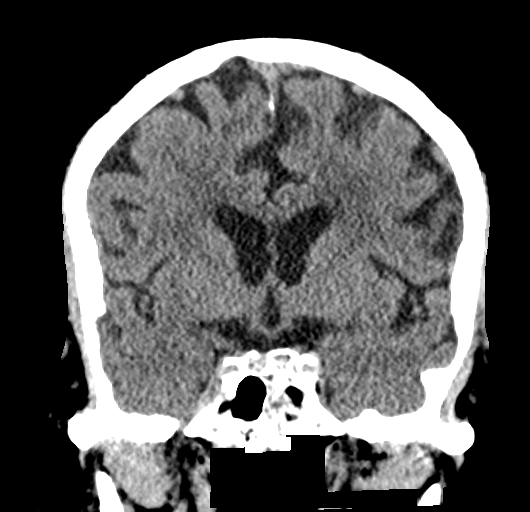
[im 47/84  brain]
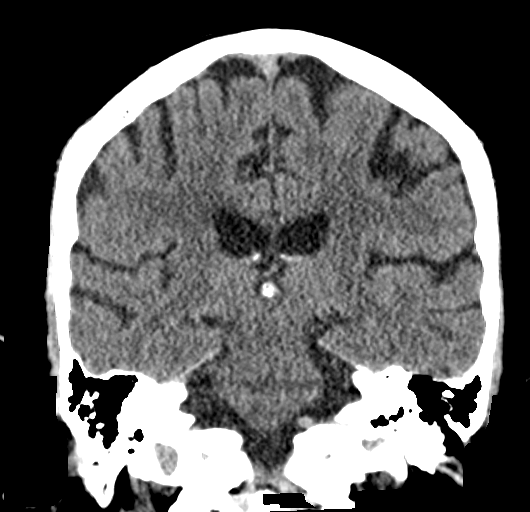

[Series 5: sag soft · sagittal · 0.33mm/px · 3 of 59 slices shown]
[im 20/59  brain]
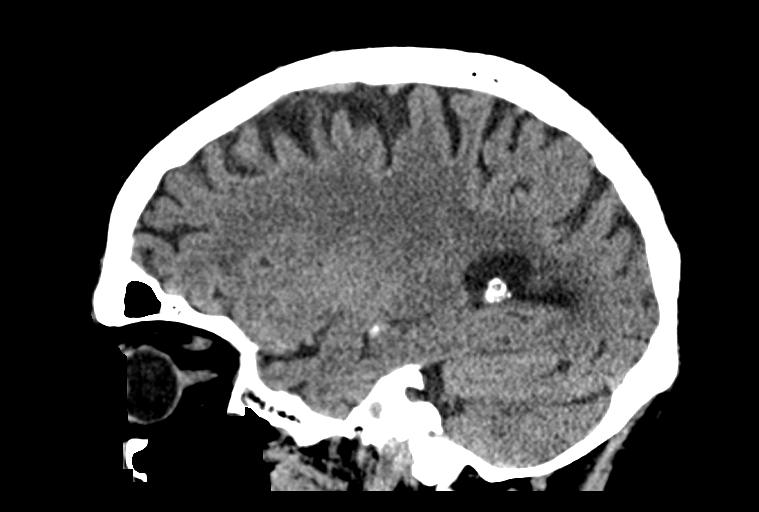
[im 30/59  brain]
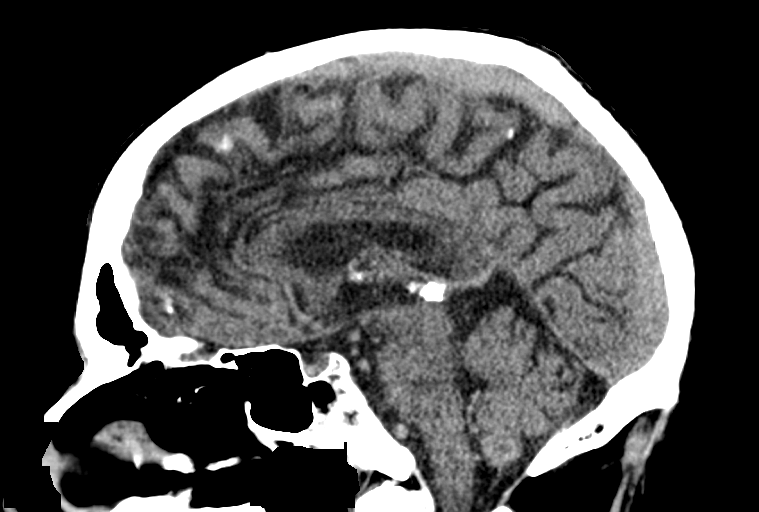
[im 39/59  brain]
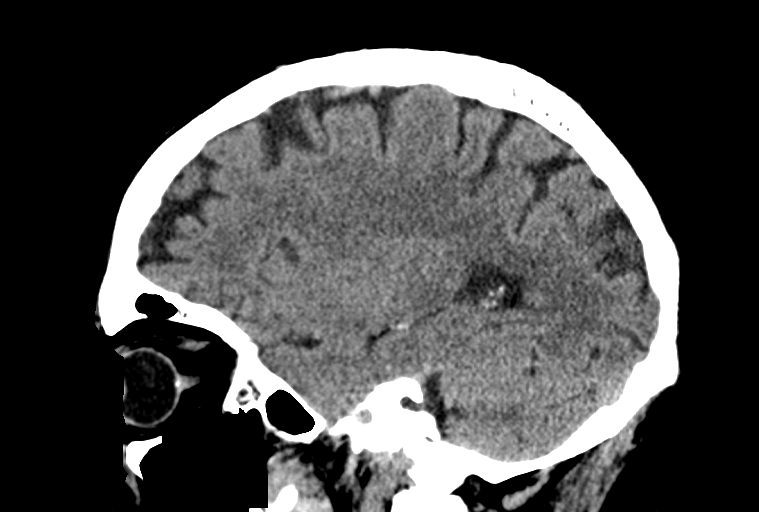

[15 of 47 positions shown; findings below may reference images not displayed]

FINDINGS: Brain: Diffuse cerebral atrophy. Ventricular dilatation consistent
with central atrophy. Low-attenuation in the deep white matter
consistent small vessel ischemia. No mass-effect or midline shift.
No abnormal extra-axial fluid collections. Gray-white matter
junctions are distinct. Basal cisterns are not effaced. No acute
intracranial hemorrhage.

Vascular: Moderate intracranial vascular calcifications.

Skull: Calvarium appears intact.

Sinuses/Orbits: Paranasal sinuses and mastoid air cells are clear
except for small retention cysts in the maxillary antra. No acute
air-fluid levels.

Other: None.
IMPRESSION: No acute intracranial abnormalities. Chronic atrophy and small
vessel ischemic changes.

## 2018-05-08 MED ORDER — LIDOCAINE-EPINEPHRINE-TETRACAINE (LET) SOLUTION
3.0000 mL | Freq: Once | NASAL | Status: AC
  Administered 2018-05-08: 3 mL via TOPICAL
  Filled 2018-05-08: qty 3

## 2018-05-08 MED ORDER — BACITRACIN ZINC 500 UNIT/GM EX OINT
TOPICAL_OINTMENT | Freq: Two times a day (BID) | CUTANEOUS | Status: DC
  Administered 2018-05-08: 22:00:00 via TOPICAL

## 2018-05-08 NOTE — ED Notes (Signed)
Dressings removed for EDP- sterile water wet guaze placed on forearm and bilat knee

## 2018-05-08 NOTE — ED Notes (Signed)
ED Provider at bedside. 

## 2018-05-08 NOTE — ED Triage Notes (Addendum)
Pt tripped/fell in driveway ~4ON-GEXB tear to right FA-saline gauze dsg placed-abrasions to both knees, lac and abrasions to right temporal area-cleaned with salin-denies LOC-NAD-to triage in w/c

## 2018-05-08 NOTE — ED Provider Notes (Signed)
[place] EMERGENCY DEPARTMENT Provider Note   CSN: [id number] Arrival date & time: 05/08/18  1929     History   Chief Complaint Chief Complaint  Patient presents with  . Fall    HPI Adrian Foster is a 84 y.o. male.  84 year old male presents for evaluation after a fall.  Patient is on Coumadin for previous PE and DVT, states that he was in his driveway today when he tripped and fell landing on his right side.  Patient reports laceration to his right temple area as well as large skin tear to his right forearm and abrasions to both knees.  Patient was able to stand up with assistance after his fall and has been ambulatory without difficulty since.  Patient took a shower and cleaned up his wounds and then thought he may need a stitch in his laceration on his face that he came to the emergency room.  Patient denies headache, nausea, vomiting, changes in vision.  Denies feeling weak or dizzy prior to his fall.  No other complaints or concerns today.  Last tetanus is less than 5 years ago.     Past Medical History:  Diagnosis Date  . Diabetes mellitus without complication ([place])   . DVT (deep venous thrombosis) ([place])   . Hypertension   . MI (myocardial infarction) ([place])   . Pulmonary embolism ([facility])     There are no active problems to display for this patient.   Past Surgical History:  Procedure Laterality Date  . CHOLECYSTECTOMY    . REPLACEMENT TOTAL KNEE BILATERAL    . VENA CAVA FILTER PLACEMENT          Home Medications    Prior to Admission medications   Medication Sig Start Date End Date Taking? Authorizing Provider  allopurinol (ZYLOPRIM) 300 MG tablet Take 300 mg by mouth daily.    [provider]  aspirin 81 MG tablet Take 81 mg by mouth daily.    [provider]  carvedilol (COREG) 3.125 MG tablet Take 3.125 mg by mouth 2 (two) times daily with a meal.    [provider]  glipiZIDE (GLUCOTROL XL) 2.5 MG 24 hr tablet Take  2.5 mg by mouth daily.    [provider]  losartan (COZAAR) 25 MG tablet Take 25 mg by mouth daily.    [provider]  metFORMIN (GLUCOPHAGE) 1000 MG tablet Take 1,000 mg by mouth 2 (two) times daily with a meal.    [provider]  metolazone (ZAROXOLYN) 2.5 MG tablet Take 2.5 mg by mouth daily.    [provider]  potassium chloride (KLOR-CON) 20 MEQ packet Take 20 mEq by mouth 2 (two) times daily.    [provider]  simvastatin (ZOCOR) 20 MG tablet Take 20 mg by mouth every evening.    [provider]  torsemide (DEMADEX) 10 MG tablet Take 10 mg by mouth daily.    [provider]  warfarin (COUMADIN) 5 MG tablet Take 5 mg by mouth daily.    [provider]    Family History No family history on file.  Social History Social History   Tobacco Use  . Smoking status: Former Research scientist (life sciences)  . Smokeless tobacco: Never Used  Substance Use Topics  . Alcohol use: No  . Drug use: No     Allergies   Patient has no known allergies.   Review of Systems Review of Systems  Constitutional: Negative for fever.  Eyes: Negative for visual disturbance.  Gastrointestinal: Negative for nausea and vomiting.  Musculoskeletal: Negative for arthralgias, back pain, joint swelling, myalgias and neck pain.  Skin: Positive for wound.  Neurological: Negative for dizziness, weakness and headaches.  Hematological: Bruises/bleeds easily.  Psychiatric/Behavioral: Negative for confusion.  All other systems reviewed and are negative.    Physical Exam Updated Vital Signs BP (!) 106/56 (BP Location: Left Arm)   Pulse 65   Temp 97.9 F (36.6 C) (Oral)   Resp 20   Ht 6\' 3"  (1.905 m)   Wt 120.2 kg   SpO2 98%   BMI 33.12 kg/m   Physical Exam  Constitutional: He is oriented to person, place, and time. He appears well-developed and well-nourished. No distress.  HENT:  Head: Normocephalic.    Eyes: Pupils are equal, round, and  reactive to light. EOM are normal.  Neck: Normal range of motion. Neck supple.  Cardiovascular: Intact distal pulses.  Pulmonary/Chest: Effort normal.  Musculoskeletal: He exhibits no deformity.       Right knee: He exhibits normal range of motion. No tenderness found.       Left knee: He exhibits normal range of motion. No tenderness found.       Cervical back: Normal.       Thoracic back: He exhibits no tenderness and no bony tenderness.       Lumbar back: He exhibits no tenderness and no bony tenderness.       Right forearm: He exhibits no bony tenderness, no edema and no deformity.       Arms:      Legs: Neurological: He is alert and oriented to person, place, and time. No sensory deficit.  Skin: Skin is warm and dry. He is not diaphoretic.  Psychiatric: He has a normal mood and affect. His behavior is normal.  Nursing note and vitals reviewed.    ED Treatments / Results  Labs (all labs ordered are listed, but only abnormal results are displayed) Labs Reviewed - No data to display  EKG None  Radiology Ct Head Wo Contrast  Result Date: 05/08/2018 CLINICAL DATA:  Trip and fall injury. Laceration and abrasion to the right temporal area. No loss of consciousness. EXAM: CT HEAD WITHOUT CONTRAST TECHNIQUE: Contiguous axial images were obtained from the base of the skull through the vertex without intravenous contrast. COMPARISON:  None. FINDINGS: Brain: Diffuse cerebral atrophy. Ventricular dilatation consistent with central atrophy. Low-attenuation in the deep white matter consistent small vessel ischemia. No mass-effect or midline shift. No abnormal extra-axial fluid collections. Gray-white matter junctions are distinct. Basal cisterns are not effaced. No acute intracranial hemorrhage. Vascular: Moderate intracranial vascular calcifications. Skull: Calvarium appears intact. Sinuses/Orbits: Paranasal sinuses and mastoid air cells are clear except for small retention cysts in the  maxillary antra. No acute air-fluid levels. Other: None. IMPRESSION: No acute intracranial abnormalities. Chronic atrophy and small vessel ischemic changes. Electronically Signed   By: Lucienne Capers M.D.   On: 05/08/2018 21:14    Procedures .Marland KitchenLaceration Repair Date/Time: 05/08/2018 10:00 PM Performed by: Tacy Learn, PA-C Authorized by: Tacy Learn, PA-C   Consent:    Consent obtained:  Verbal   Consent given by:  Patient   Risks discussed:  Infection, need for additional repair, pain, poor cosmetic result and poor wound healing   Alternatives discussed:  No treatment and delayed treatment Universal protocol:    Procedure explained and questions answered to patient or proxy's satisfaction: yes     Relevant documents present and verified: yes  Imaging studies available: yes     Immediately prior to procedure, a time out was called: yes     Patient identity confirmed:  Verbally with patient Anesthesia (see MAR for exact dosages):    Anesthesia method:  Topical application   Topical anesthetic:  LET Laceration details:    Location:  Face   Facial location: right temple.   Length (cm):  1   Depth (mm):  2 Repair type:    Repair type:  Simple Pre-procedure details:    Preparation:  Patient was prepped and draped in usual sterile fashion Exploration:    Hemostasis achieved with:  LET   Wound exploration: wound explored through full range of motion and entire depth of wound probed and visualized     Wound extent: no foreign bodies/material noted and no muscle damage noted     Contaminated: no   Treatment:    Area cleansed with:  Saline   Amount of cleaning:  Standard   Irrigation solution:  Sterile saline Skin repair:    Repair method:  Tissue adhesive Approximation:    Approximation:  Close Post-procedure details:    Dressing:  Open (no dressing)   (including critical care time)  Medications Ordered in ED Medications  bacitracin ointment ( Topical Given  05/08/18 2140)  lidocaine-EPINEPHrine-tetracaine (LET) solution (3 mLs Topical Given 05/08/18 2132)     Initial Impression / Assessment and Plan / ED Course  I have reviewed the triage vital signs and the nursing notes.  Pertinent labs & imaging results that were available during my care of the patient were reviewed by me and considered in my medical decision making (see chart for details).  Clinical Course as of May 08 2201  Wed May 09, 5863  1684 84 year old male presents for evaluation after a mechanical fall today.  Patient has a laceration of the right temple, bleeding controlled prior to arrival.  Wound was closed with Dermabond.  Patient has a large skin tear to the right forearm, this was cleaned and dressed by nursing staff.  Abrasions to the knees were also dressed.  CT of the head obtained due to head injury while on Coumadin, negative for acute bleed.  Commend patient recheck with his PCP in 2 days, return to ER for any concerning symptoms.   [LM]    Clinical Course User Index [LM] Tacy Learn, PA-C   Final Clinical Impressions(s) / ED Diagnoses   Final diagnoses:  Fall, initial encounter  Facial laceration, initial encounter  Skin tear of right forearm without complication, initial encounter  Abrasion of knee, unspecified laterality, initial encounter    ED Discharge Orders    None       Tacy Learn, PA-C 05/08/18 [place], [place], DO 05/08/18 2331

## 2018-05-08 NOTE — ED Notes (Signed)
Pt knees cleansed and dressed with bacitracin, telfa, and kerlex. Pt right forearm cleansed and dressed with xeroform and kerlex.

## 2018-05-08 NOTE — ED Notes (Signed)
Patient transported to CT 

## 2021-06-09 ENCOUNTER — Emergency Department (HOSPITAL_BASED_OUTPATIENT_CLINIC_OR_DEPARTMENT_OTHER): Payer: Medicare Other

## 2021-06-09 ENCOUNTER — Encounter (HOSPITAL_BASED_OUTPATIENT_CLINIC_OR_DEPARTMENT_OTHER): Payer: Self-pay | Admitting: *Deleted

## 2021-06-09 ENCOUNTER — Inpatient Hospital Stay (HOSPITAL_COMMUNITY): Payer: Medicare Other

## 2021-06-09 ENCOUNTER — Inpatient Hospital Stay (HOSPITAL_BASED_OUTPATIENT_CLINIC_OR_DEPARTMENT_OTHER)
Admission: EM | Admit: 2021-06-09 | Discharge: 2021-07-03 | DRG: 082 | Disposition: E | Payer: Medicare Other | Attending: Internal Medicine | Admitting: Internal Medicine

## 2021-06-09 ENCOUNTER — Other Ambulatory Visit: Payer: Self-pay

## 2021-06-09 DIAGNOSIS — R404 Transient alteration of awareness: Secondary | ICD-10-CM | POA: Diagnosis not present

## 2021-06-09 DIAGNOSIS — Z20822 Contact with and (suspected) exposure to covid-19: Secondary | ICD-10-CM | POA: Diagnosis present

## 2021-06-09 DIAGNOSIS — S065XAA Traumatic subdural hemorrhage with loss of consciousness status unknown, initial encounter: Secondary | ICD-10-CM | POA: Diagnosis not present

## 2021-06-09 DIAGNOSIS — Z515 Encounter for palliative care: Secondary | ICD-10-CM

## 2021-06-09 DIAGNOSIS — G40909 Epilepsy, unspecified, not intractable, without status epilepticus: Secondary | ICD-10-CM | POA: Diagnosis present

## 2021-06-09 DIAGNOSIS — Z9049 Acquired absence of other specified parts of digestive tract: Secondary | ICD-10-CM

## 2021-06-09 DIAGNOSIS — J479 Bronchiectasis, uncomplicated: Secondary | ICD-10-CM | POA: Diagnosis present

## 2021-06-09 DIAGNOSIS — Z7982 Long term (current) use of aspirin: Secondary | ICD-10-CM

## 2021-06-09 DIAGNOSIS — Z95828 Presence of other vascular implants and grafts: Secondary | ICD-10-CM

## 2021-06-09 DIAGNOSIS — E119 Type 2 diabetes mellitus without complications: Secondary | ICD-10-CM | POA: Diagnosis present

## 2021-06-09 DIAGNOSIS — E43 Unspecified severe protein-calorie malnutrition: Secondary | ICD-10-CM | POA: Insufficient documentation

## 2021-06-09 DIAGNOSIS — E871 Hypo-osmolality and hyponatremia: Secondary | ICD-10-CM | POA: Diagnosis present

## 2021-06-09 DIAGNOSIS — R413 Other amnesia: Secondary | ICD-10-CM | POA: Diagnosis present

## 2021-06-09 DIAGNOSIS — Z79899 Other long term (current) drug therapy: Secondary | ICD-10-CM

## 2021-06-09 DIAGNOSIS — W1830XA Fall on same level, unspecified, initial encounter: Secondary | ICD-10-CM | POA: Diagnosis present

## 2021-06-09 DIAGNOSIS — I358 Other nonrheumatic aortic valve disorders: Secondary | ICD-10-CM | POA: Diagnosis present

## 2021-06-09 DIAGNOSIS — J9601 Acute respiratory failure with hypoxia: Secondary | ICD-10-CM | POA: Diagnosis not present

## 2021-06-09 DIAGNOSIS — I2693 Single subsegmental pulmonary embolism without acute cor pulmonale: Secondary | ICD-10-CM | POA: Diagnosis present

## 2021-06-09 DIAGNOSIS — C3411 Malignant neoplasm of upper lobe, right bronchus or lung: Secondary | ICD-10-CM | POA: Diagnosis present

## 2021-06-09 DIAGNOSIS — C7801 Secondary malignant neoplasm of right lung: Secondary | ICD-10-CM | POA: Diagnosis present

## 2021-06-09 DIAGNOSIS — Z01818 Encounter for other preprocedural examination: Secondary | ICD-10-CM

## 2021-06-09 DIAGNOSIS — J90 Pleural effusion, not elsewhere classified: Secondary | ICD-10-CM | POA: Diagnosis not present

## 2021-06-09 DIAGNOSIS — I10 Essential (primary) hypertension: Secondary | ICD-10-CM | POA: Diagnosis present

## 2021-06-09 DIAGNOSIS — E861 Hypovolemia: Secondary | ICD-10-CM | POA: Diagnosis present

## 2021-06-09 DIAGNOSIS — J189 Pneumonia, unspecified organism: Secondary | ICD-10-CM | POA: Diagnosis not present

## 2021-06-09 DIAGNOSIS — R569 Unspecified convulsions: Secondary | ICD-10-CM | POA: Diagnosis not present

## 2021-06-09 DIAGNOSIS — Z923 Personal history of irradiation: Secondary | ICD-10-CM

## 2021-06-09 DIAGNOSIS — Z86711 Personal history of pulmonary embolism: Secondary | ICD-10-CM

## 2021-06-09 DIAGNOSIS — R4189 Other symptoms and signs involving cognitive functions and awareness: Secondary | ICD-10-CM | POA: Diagnosis present

## 2021-06-09 DIAGNOSIS — I252 Old myocardial infarction: Secondary | ICD-10-CM

## 2021-06-09 DIAGNOSIS — N289 Disorder of kidney and ureter, unspecified: Secondary | ICD-10-CM | POA: Diagnosis present

## 2021-06-09 DIAGNOSIS — R791 Abnormal coagulation profile: Secondary | ICD-10-CM | POA: Diagnosis present

## 2021-06-09 DIAGNOSIS — T361X5A Adverse effect of cephalosporins and other beta-lactam antibiotics, initial encounter: Secondary | ICD-10-CM | POA: Diagnosis present

## 2021-06-09 DIAGNOSIS — D649 Anemia, unspecified: Secondary | ICD-10-CM | POA: Diagnosis present

## 2021-06-09 DIAGNOSIS — J80 Acute respiratory distress syndrome: Secondary | ICD-10-CM

## 2021-06-09 DIAGNOSIS — J690 Pneumonitis due to inhalation of food and vomit: Secondary | ICD-10-CM | POA: Diagnosis not present

## 2021-06-09 DIAGNOSIS — G9341 Metabolic encephalopathy: Secondary | ICD-10-CM | POA: Diagnosis present

## 2021-06-09 DIAGNOSIS — J151 Pneumonia due to Pseudomonas: Secondary | ICD-10-CM | POA: Diagnosis not present

## 2021-06-09 DIAGNOSIS — Z86718 Personal history of other venous thrombosis and embolism: Secondary | ICD-10-CM

## 2021-06-09 DIAGNOSIS — I2510 Atherosclerotic heart disease of native coronary artery without angina pectoris: Secondary | ICD-10-CM | POA: Diagnosis present

## 2021-06-09 DIAGNOSIS — Z66 Do not resuscitate: Secondary | ICD-10-CM | POA: Diagnosis not present

## 2021-06-09 DIAGNOSIS — J942 Hemothorax: Secondary | ICD-10-CM | POA: Diagnosis not present

## 2021-06-09 DIAGNOSIS — R0902 Hypoxemia: Secondary | ICD-10-CM | POA: Diagnosis present

## 2021-06-09 DIAGNOSIS — Z7984 Long term (current) use of oral hypoglycemic drugs: Secondary | ICD-10-CM

## 2021-06-09 DIAGNOSIS — R1310 Dysphagia, unspecified: Secondary | ICD-10-CM | POA: Diagnosis present

## 2021-06-09 DIAGNOSIS — Z85118 Personal history of other malignant neoplasm of bronchus and lung: Secondary | ICD-10-CM

## 2021-06-09 DIAGNOSIS — Z87891 Personal history of nicotine dependence: Secondary | ICD-10-CM

## 2021-06-09 DIAGNOSIS — I700 Atherosclerosis of aorta: Secondary | ICD-10-CM | POA: Diagnosis present

## 2021-06-09 DIAGNOSIS — E785 Hyperlipidemia, unspecified: Secondary | ICD-10-CM | POA: Diagnosis present

## 2021-06-09 DIAGNOSIS — Z8546 Personal history of malignant neoplasm of prostate: Secondary | ICD-10-CM

## 2021-06-09 DIAGNOSIS — Z96653 Presence of artificial knee joint, bilateral: Secondary | ICD-10-CM | POA: Diagnosis present

## 2021-06-09 DIAGNOSIS — K567 Ileus, unspecified: Secondary | ICD-10-CM | POA: Diagnosis present

## 2021-06-09 DIAGNOSIS — Z7901 Long term (current) use of anticoagulants: Secondary | ICD-10-CM

## 2021-06-09 DIAGNOSIS — C61 Malignant neoplasm of prostate: Secondary | ICD-10-CM | POA: Diagnosis present

## 2021-06-09 DIAGNOSIS — J9602 Acute respiratory failure with hypercapnia: Secondary | ICD-10-CM | POA: Diagnosis not present

## 2021-06-09 DIAGNOSIS — E1165 Type 2 diabetes mellitus with hyperglycemia: Secondary | ICD-10-CM | POA: Diagnosis not present

## 2021-06-09 DIAGNOSIS — R0609 Other forms of dyspnea: Secondary | ICD-10-CM | POA: Diagnosis not present

## 2021-06-09 DIAGNOSIS — R4701 Aphasia: Secondary | ICD-10-CM | POA: Diagnosis present

## 2021-06-09 DIAGNOSIS — R069 Unspecified abnormalities of breathing: Secondary | ICD-10-CM

## 2021-06-09 DIAGNOSIS — R0603 Acute respiratory distress: Secondary | ICD-10-CM

## 2021-06-09 DIAGNOSIS — E876 Hypokalemia: Secondary | ICD-10-CM | POA: Diagnosis present

## 2021-06-09 DIAGNOSIS — G253 Myoclonus: Secondary | ICD-10-CM | POA: Diagnosis not present

## 2021-06-09 LAB — CBC WITH DIFFERENTIAL/PLATELET
Abs Immature Granulocytes: 0.07 10*3/uL (ref 0.00–0.07)
Basophils Absolute: 0 10*3/uL (ref 0.0–0.1)
Basophils Relative: 0 %
Eosinophils Absolute: 0 10*3/uL (ref 0.0–0.5)
Eosinophils Relative: 0 %
HCT: 41.1 % (ref 39.0–52.0)
Hemoglobin: 12.7 g/dL — ABNORMAL LOW (ref 13.0–17.0)
Immature Granulocytes: 1 %
Lymphocytes Relative: 3 %
Lymphs Abs: 0.3 10*3/uL — ABNORMAL LOW (ref 0.7–4.0)
MCH: 29.5 pg (ref 26.0–34.0)
MCHC: 30.9 g/dL (ref 30.0–36.0)
MCV: 95.4 fL (ref 80.0–100.0)
Monocytes Absolute: 0.6 10*3/uL (ref 0.1–1.0)
Monocytes Relative: 6 %
Neutro Abs: 8.9 10*3/uL — ABNORMAL HIGH (ref 1.7–7.7)
Neutrophils Relative %: 90 %
Platelets: 333 10*3/uL (ref 150–400)
RBC: 4.31 MIL/uL (ref 4.22–5.81)
RDW: 16.6 % — ABNORMAL HIGH (ref 11.5–15.5)
WBC: 9.9 10*3/uL (ref 4.0–10.5)
nRBC: 0.7 % — ABNORMAL HIGH (ref 0.0–0.2)

## 2021-06-09 LAB — COMPREHENSIVE METABOLIC PANEL
ALT: 24 U/L (ref 0–44)
AST: 32 U/L (ref 15–41)
Albumin: 2.1 g/dL — ABNORMAL LOW (ref 3.5–5.0)
Alkaline Phosphatase: 125 U/L (ref 38–126)
Anion gap: 9 (ref 5–15)
BUN: 26 mg/dL — ABNORMAL HIGH (ref 8–23)
CO2: 35 mmol/L — ABNORMAL HIGH (ref 22–32)
Calcium: 9.6 mg/dL (ref 8.9–10.3)
Chloride: 89 mmol/L — ABNORMAL LOW (ref 98–111)
Creatinine, Ser: 0.78 mg/dL (ref 0.61–1.24)
GFR, Estimated: >60 mL/min (ref >60)
Glucose, Bld: 161 mg/dL — ABNORMAL HIGH (ref 70–99)
Potassium: 4.7 mmol/L (ref 3.5–5.1)
Sodium: 133 mmol/L — ABNORMAL LOW (ref 135–145)
Total Bilirubin: 0.4 mg/dL (ref 0.3–1.2)
Total Protein: 6.8 g/dL (ref 6.5–8.1)

## 2021-06-09 LAB — TROPONIN I (HIGH SENSITIVITY)
Troponin I (High Sensitivity): 91 ng/L — ABNORMAL HIGH (ref <18)
Troponin I (High Sensitivity): 67 ng/L — ABNORMAL HIGH (ref <18)

## 2021-06-09 LAB — RESP PANEL BY RT-PCR (FLU A&B, COVID) ARPGX2
Influenza A by PCR: NEGATIVE
Influenza B by PCR: NEGATIVE
SARS Coronavirus 2 by RT PCR: NEGATIVE

## 2021-06-09 IMAGING — CT CT ABD-PELV W/ CM
2 of 6 series · 13 of 46 positions shown, 15 images · IV contrast (Omnipaque)
Comparison: PET CT [DATE].

CLINICAL DATA: Lung cancer.  Prostate cancer.  Fall.

EXAM:
CT ANGIOGRAPHY CHEST
CT ABDOMEN AND PELVIS WITH CONTRAST
TECHNIQUE: He has been hiding her in the CT imaging of the chest was performed
using the standard protocol during bolus administration of
intravenous contrast. Multiplanar CT image reconstructions and MIPs
were obtained to evaluate the vascular anatomy. Multidetector CT
imaging of the abdomen and pelvis was performed using the standard
protocol during bolus administration of intravenous contrast.
CONTRAST:  100mL OMNIPAQUE IOHEXOL 350 MG/ML SOLN

[Series 10: axial st · axial · 0.98mm/px · z∈[+610,+1064]mm · 10 of 107 slices shown, 12 images]
[im 8/107  soft-tissue]
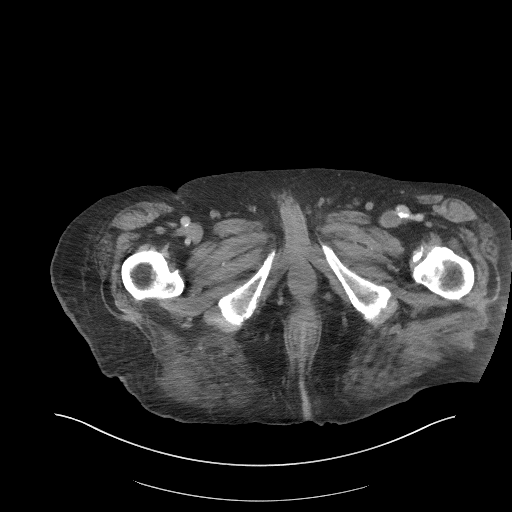
[im 8/107  bone]
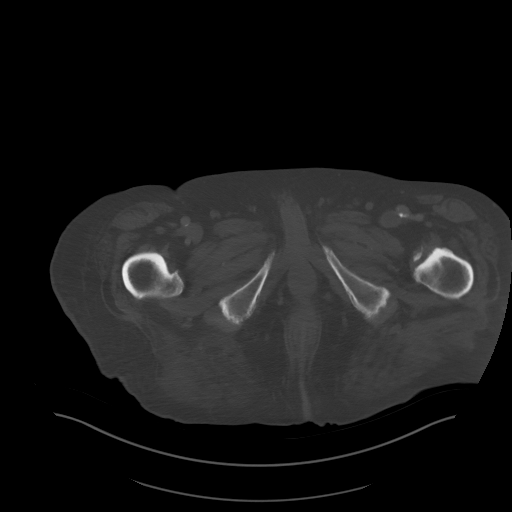
[im 16/107  soft-tissue]
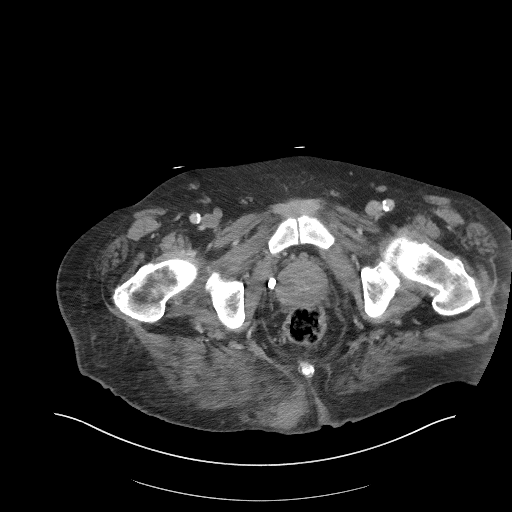
[im 31/107  soft-tissue]
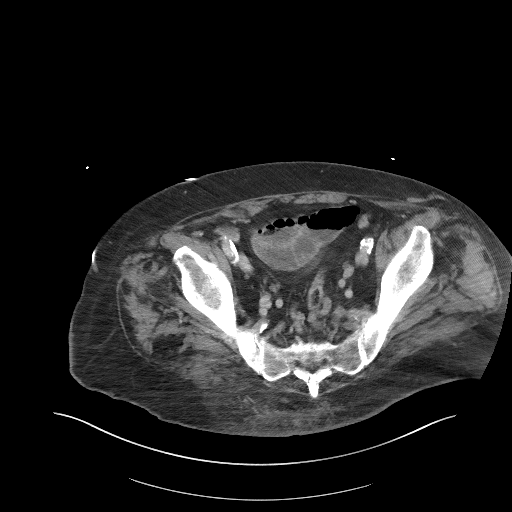
[im 38/107  soft-tissue]
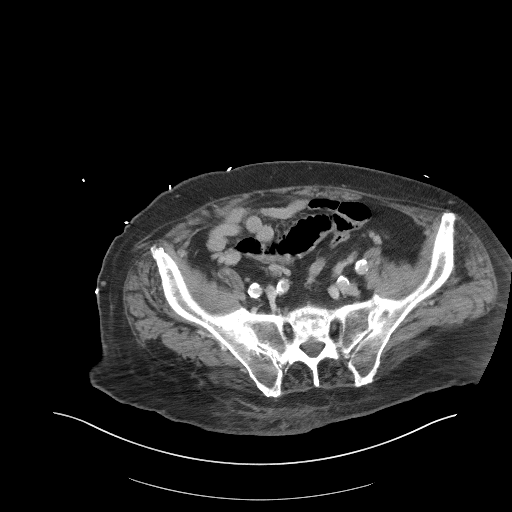
[im 46/107  soft-tissue]
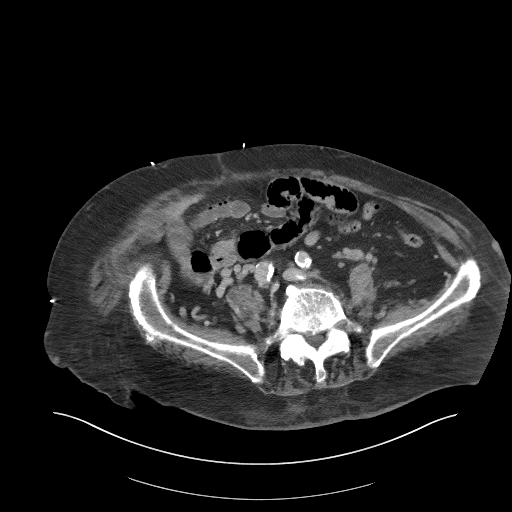
[im 61/107  soft-tissue]
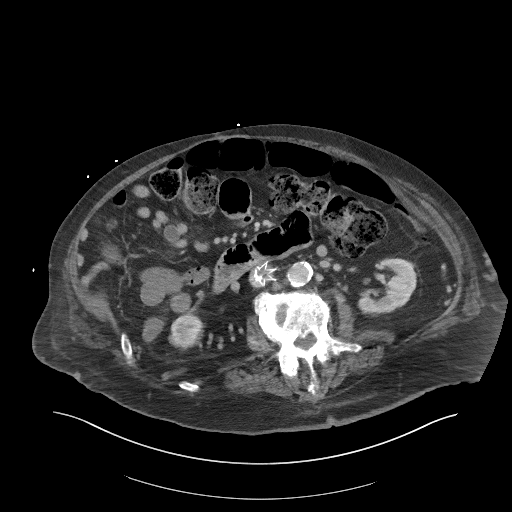
[im 69/107  soft-tissue]
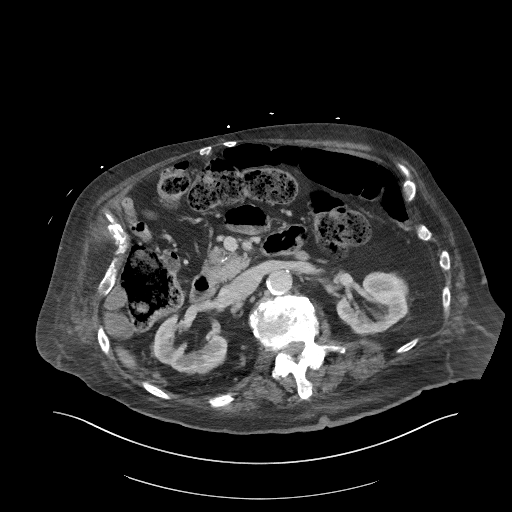
[im 76/107  soft-tissue]
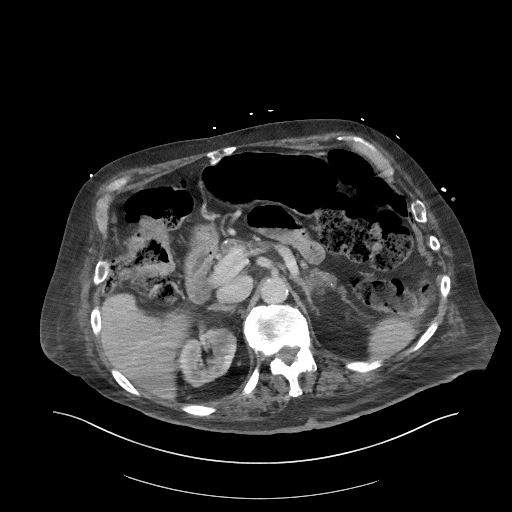
[im 91/107  soft-tissue]
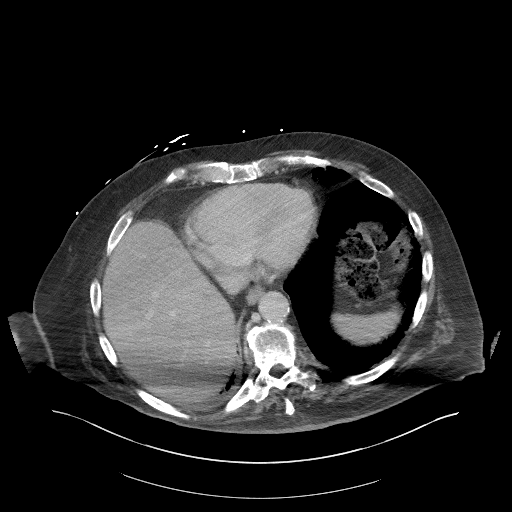
[im 91/107  bone]
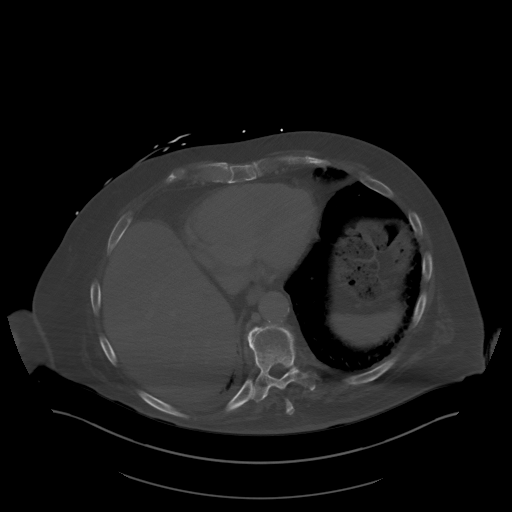
[im 99/107  soft-tissue]
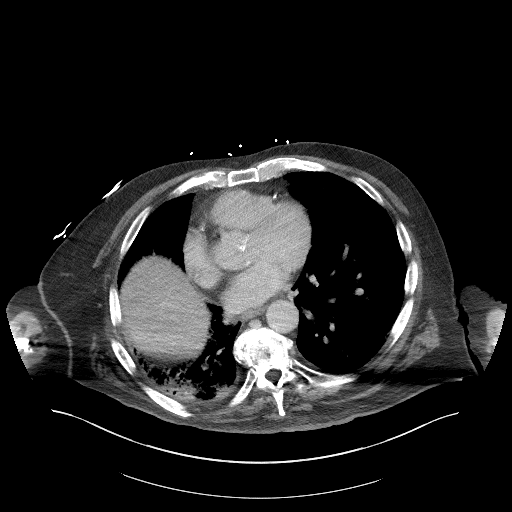

[Series 13: abd/pel coronal st · coronal · 0.82mm/px · 3 of 101 slices shown]
[im 34/101  soft-tissue]
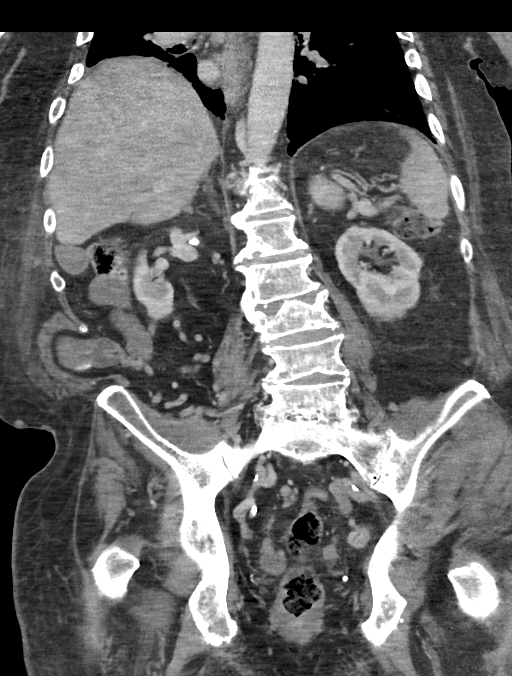
[im 45/101  soft-tissue]
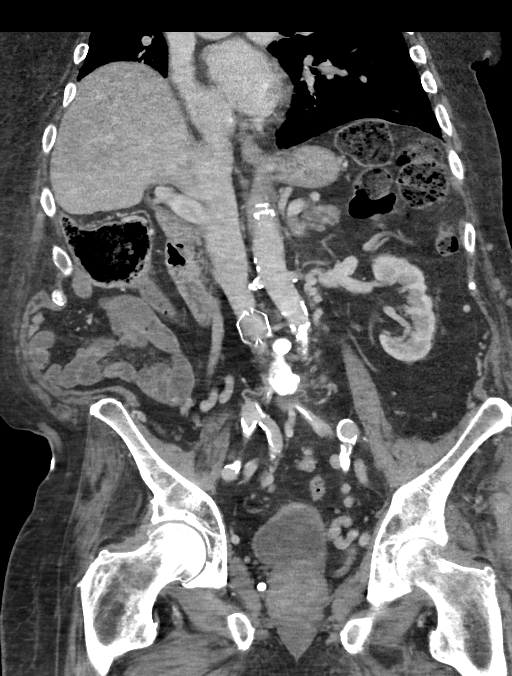
[im 56/101  soft-tissue]
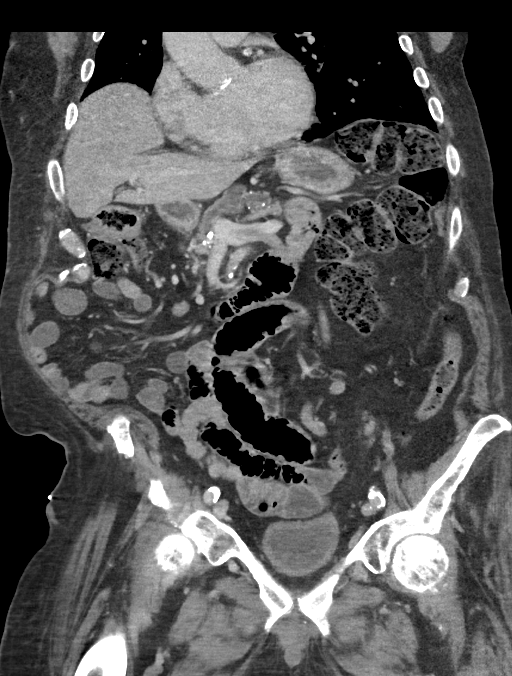

[13 of 46 positions shown; findings below may reference images not displayed]

FINDINGS: CTA CHEST FINDINGS

Cardiovascular: Heart size is upper limits of normal. There is no
pericardial effusion. Aorta is ectatic without focal dilatation.
There are atherosclerotic calcifications of the aorta and coronary
arteries.

There is adequate opacification of the pulmonary arteries. There is
significant motion artifact in the right lung base. There are
findings suspicious for segmental and subsegmental pulmonary emboli
in the right lower lobe. Otherwise, no pulmonary emboli are
visualized.

Mediastinum/Nodes: No enlarged mediastinal, hilar, or axillary lymph
nodes. Thyroid gland, trachea, and esophagus demonstrate no
significant findings.

Lungs/Pleura: Again seen is right apical tumor invading the second
and third ribs measuring proximally 5.3 x 2.9 cm appears similar to
the prior examination. Diffuse multifocal airspace opacities are
seen bilaterally most significant in the upper lobes. Patchy right
lower lobe airspace disease is similar to the prior study. There is
right lower lobe peribronchial thickening, unchanged from prior.
There is no pneumothorax. There is a trace right pleural effusion
similar to the prior study. There secretions in the right mainstem
bronchus.

Musculoskeletal: Osseous invasion are right apical mass involving
the right second and third ribs appears unchanged from the prior
examination. No acute fractures are seen. Multilevel degenerative
changes affect the spine.

Review of the MIP images confirms the above findings.

CT ABDOMEN and PELVIS FINDINGS

Hepatobiliary: No focal liver abnormality is seen. Status post
cholecystectomy. No biliary dilatation.

Pancreas: Again seen is calcification in the pancreatic head. There
is diffuse atrophy of the pancreatic neck, body and tail with ductal
dilatation similar to the prior study. Scattered pancreatic
parenchymal calcifications are seen throughout. Appearance is
unchanged from prior.

Spleen: Normal in size without focal abnormality.

Adrenals/Urinary Tract: There are rounded cortical hypodensities in
both kidneys which are too small to characterize, most likely cysts.
There is an indeterminate lesion in the superior left kidney
measuring 2.4 x 2 point cm image [DATE]. There is no hydronephrosis
or perinephric fluid. Bladder and adrenal glands are within normal
limits.

Stomach/Bowel: Cecum is high riding. Appendix is within normal
limits. There is a large amount of stool throughout the colon. There
is some mildly dilated small bowel loops in the mid abdomen
measuring 3.8 cm without definite transition point. There is no
focal bowel wall thickening or inflammation. Stomach is within
normal limits.

Vascular/Lymphatic: Aorta is normal in size. There are
atherosclerotic calcifications of the aorta and iliac arteries.
Infrarenal IVC filter present likely containing thrombus. No
discrete enlarged lymph nodes identified. Prominent pelvic and
perirectal vessels are again noted.

Reproductive: Prostate gland is enlarged, unchanged.

Other: There is no ascites or focal abdominal wall hernia. There is
subcutaneous edema and hematoma posterior to the right side of the
sacrum and coccyx measuring 6.3 by 2.8 x 3.8 cm.

Musculoskeletal: Mild compression deformity of L3 is unchanged. No
acute fractures are identified. Right ischial metastatic lesion has
minimally increased in size.

Review of the MIP images confirms the above findings.
IMPRESSION: 1. Limited by respiratory motion artifact. There are findings
suspicious for pulmonary emboli within segmental and subsegmental
branches of the right lower lobe.
2. New bilateral multifocal airspace disease throughout the upper
lobes concerning for multifocal infection.
3. Stable right lower lobe airspace disease.
4. Stable right apical mass with rib invasion.
5. Secretions in the right mainstem bronchus.
6. Soft tissue hematoma posterior to the sacrum and coccyx. No
underlying acute fracture.
7. Right ischial metastatic lesion has mildly increased in size.
8. Indeterminate right renal lesion. Recommend further evaluation
with MRI to exclude solid mass.
9. Mildly dilated central small bowel loops, indeterminate. Findings
may related to ileus or enteritis. Developing small bowel
obstruction can not be excluded in the appropriate clinical setting.
10.  Aortic Atherosclerosis ([S7]-[S7]).

## 2021-06-09 IMAGING — DX DG CHEST 1V PORT
1 series · 1 of 1 positions shown · non-contrast
Comparison: Images of previous study done on [DATE] are not
available for comparison.

CLINICAL DATA: Trauma, fall

EXAM:
PORTABLE CHEST 1 VIEW

[chest ap]
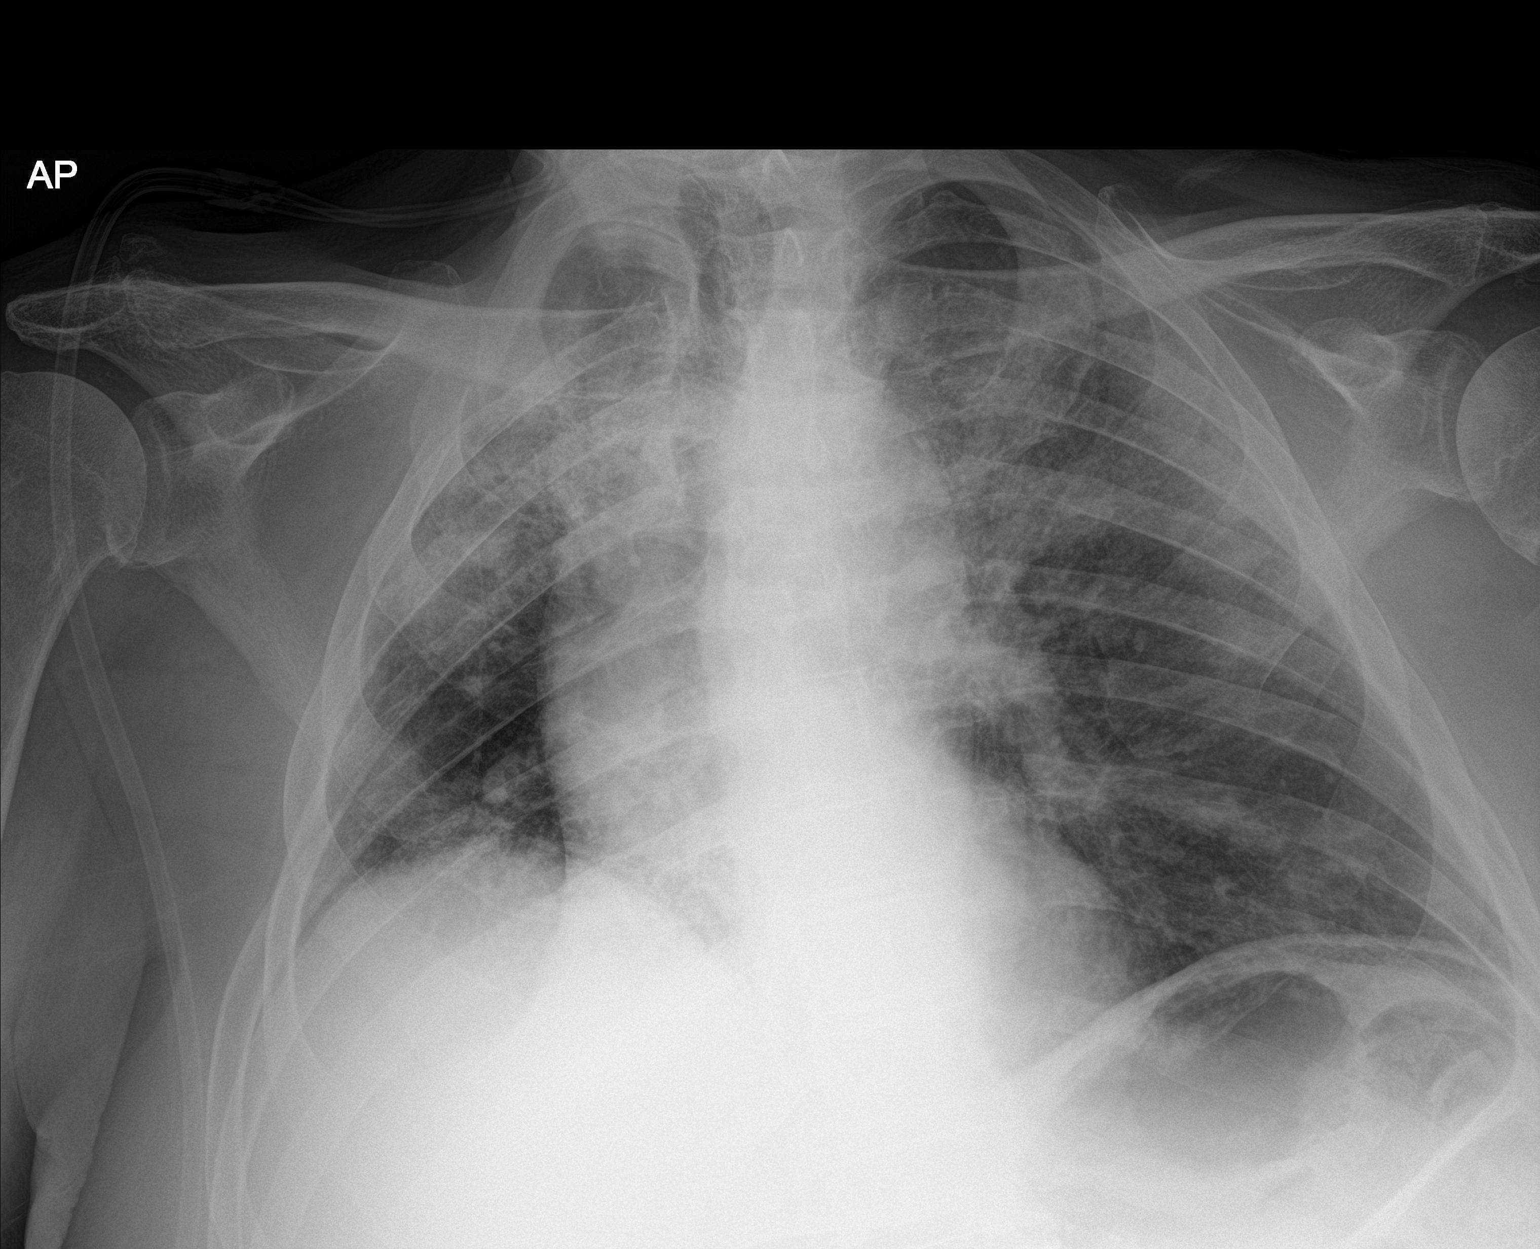

[1 of 1 positions shown; findings below may reference images not displayed]

FINDINGS: Transverse diameter of heart is within normal limits. Thoracic aorta
is tortuous and ectatic. There is decreased volume in right lung.
Increased interstitial markings are seen in both upper lung fields.
There is crowding of markings in the lower lung fields. There is no
significant pleural effusion or pneumothorax.
IMPRESSION: Increased interstitial markings are seen in both lungs more so in
both upper lung fields. There is decreased volume in right lung.
Findings may suggest scarring from fibrosis. Possibility of
superimposed pneumonia is not excluded.

## 2021-06-09 IMAGING — CT CT CERVICAL SPINE W/O CM
3 of 4 series · 13 of 33 positions shown, 16 images · non-contrast
Comparison: [DATE]

CLINICAL DATA: Fall.  Neck trauma (Age >= 65y)

EXAM:
CT CERVICAL SPINE WITHOUT CONTRAST
TECHNIQUE: Multidetector CT imaging of the cervical spine was performed without
intravenous contrast. Multiplanar CT image reconstructions were also
generated.

[Series 3: c_spine 2.0 i30s 3 · axial · 0.44mm/px · z∈[+1238,+1364]mm · 5 of 95 slices shown, 7 images]
[im 16/95  soft-tissue]
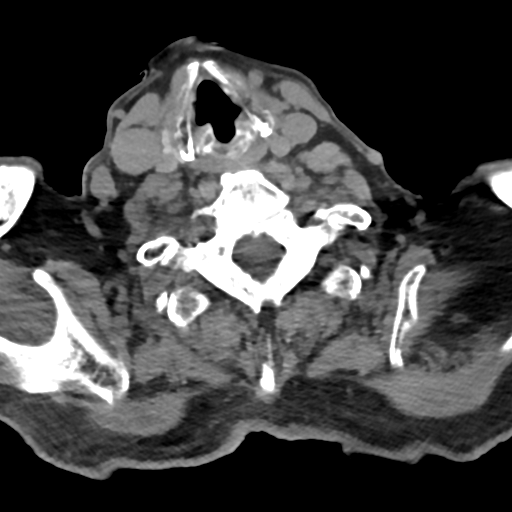
[im 16/95  bone]
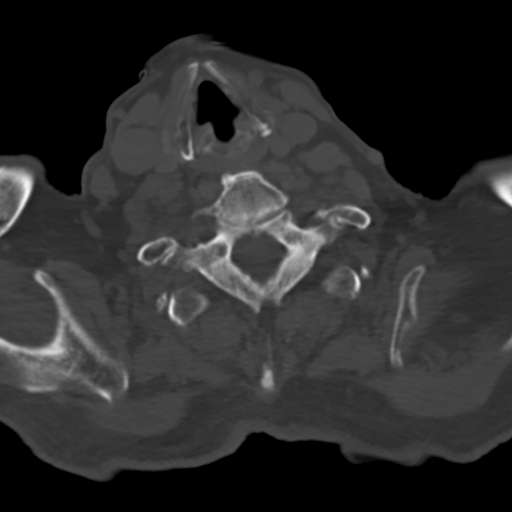
[im 32/95  bone]
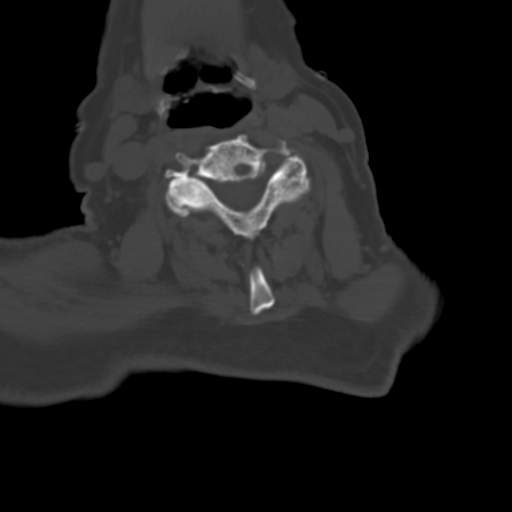
[im 48/95  bone]
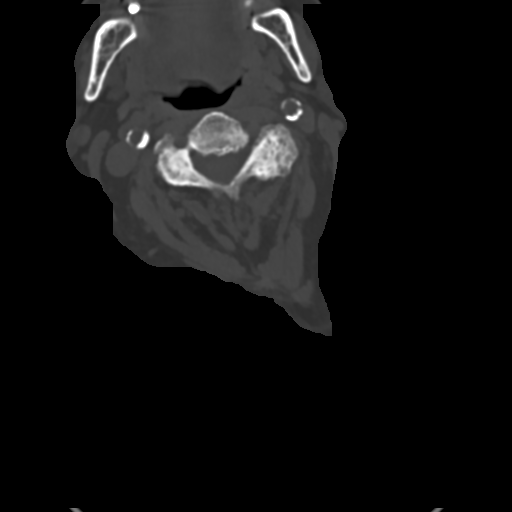
[im 63/95  bone]
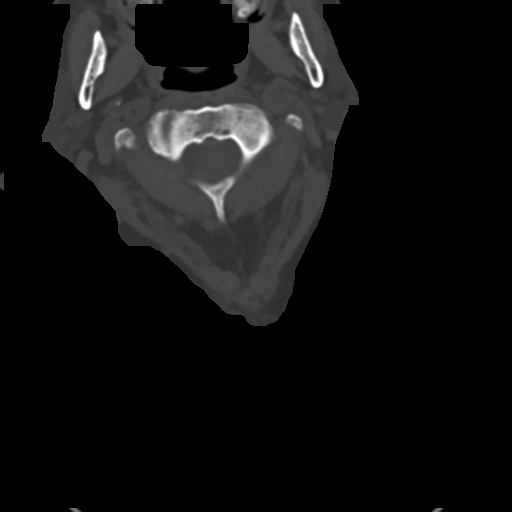
[im 79/95  soft-tissue]
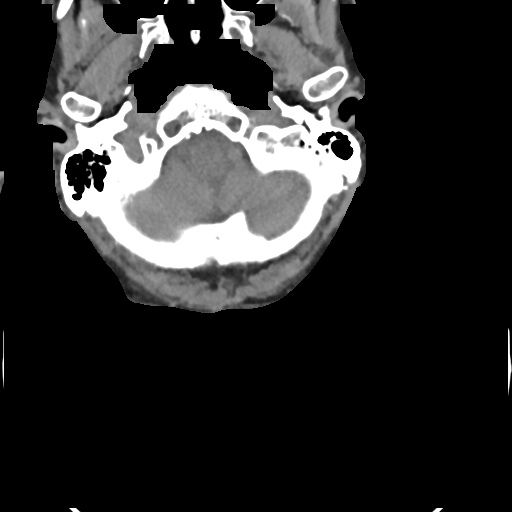
[im 79/95  bone]
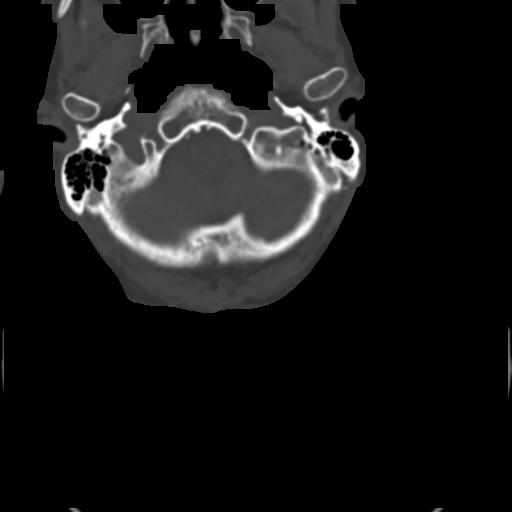

[Series 5: coronals · coronal · 0.28mm/px · 3 of 61 slices shown]
[im 13/61  bone]
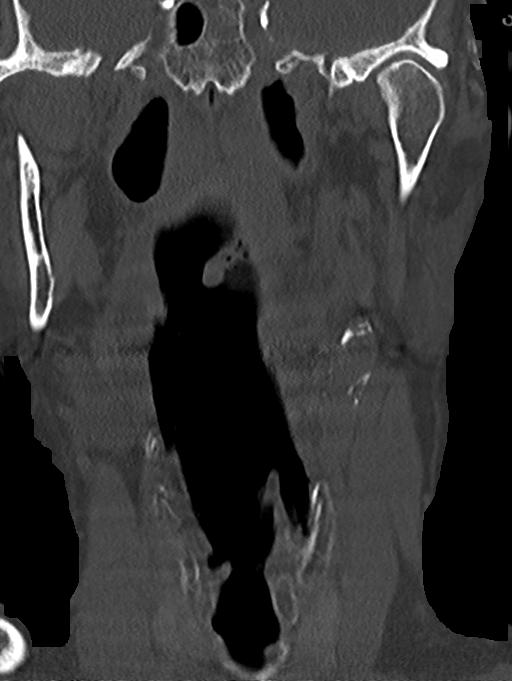
[im 25/61  bone]
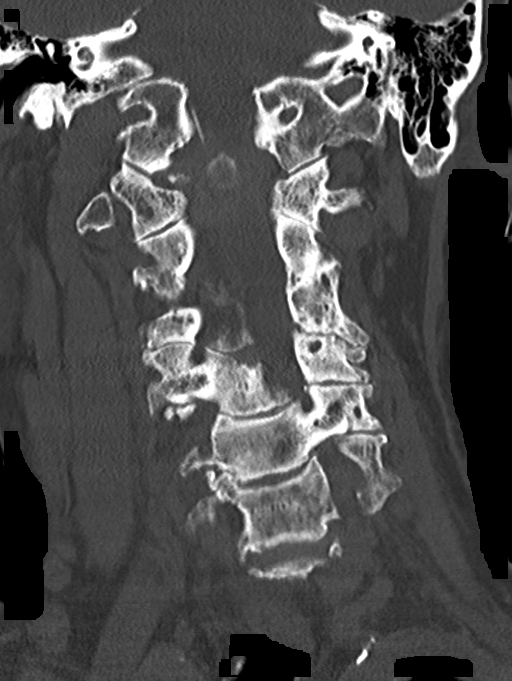
[im 37/61  bone]
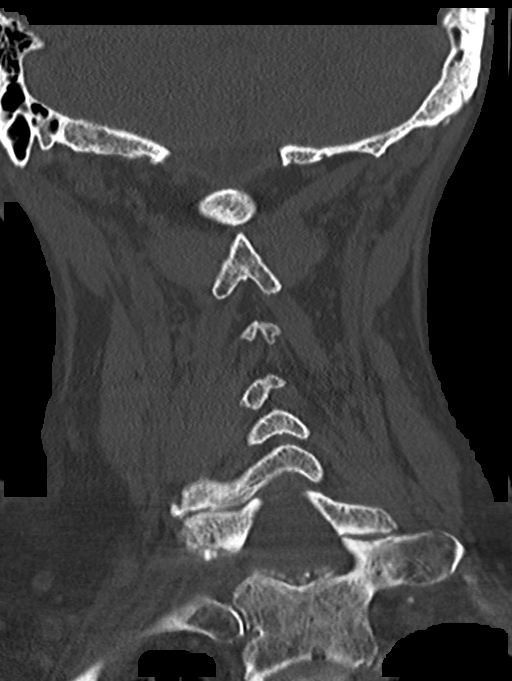

[Series 6: sagittals · sagittal · 0.29mm/px · 5 of 61 slices shown, 6 images]
[im 21/61  bone]
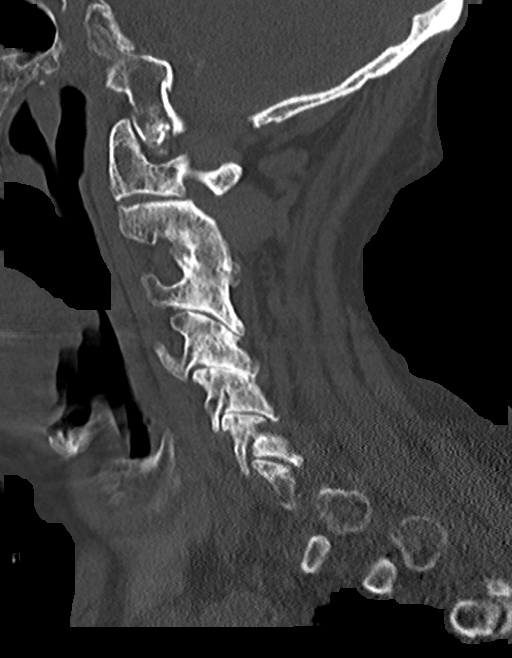
[im 26/61  bone]
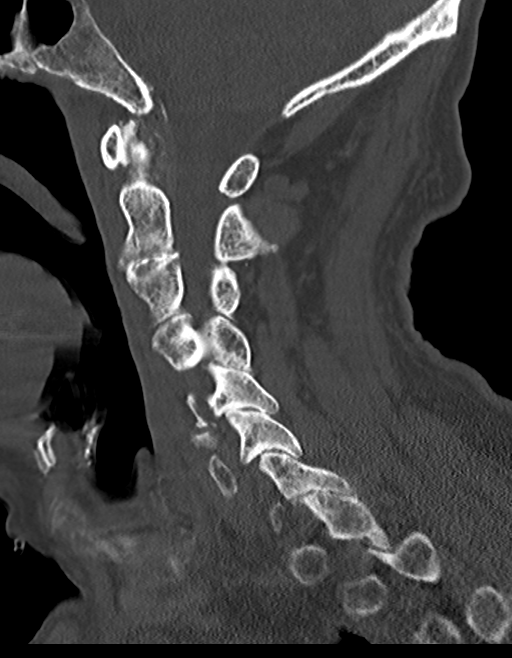
[im 31/61  soft-tissue]
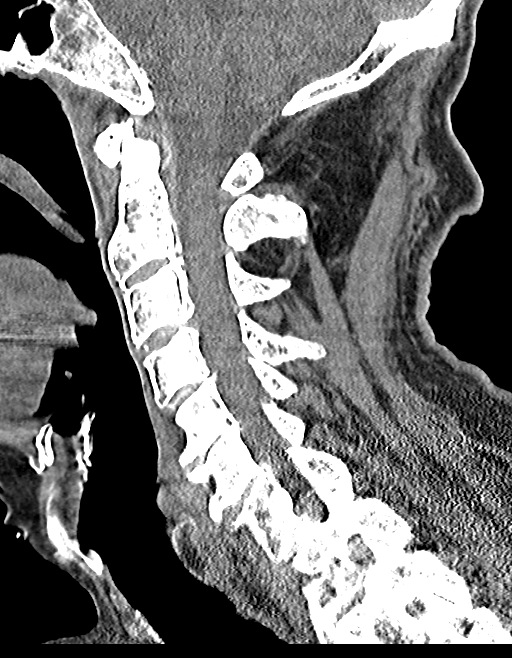
[im 31/61  bone]
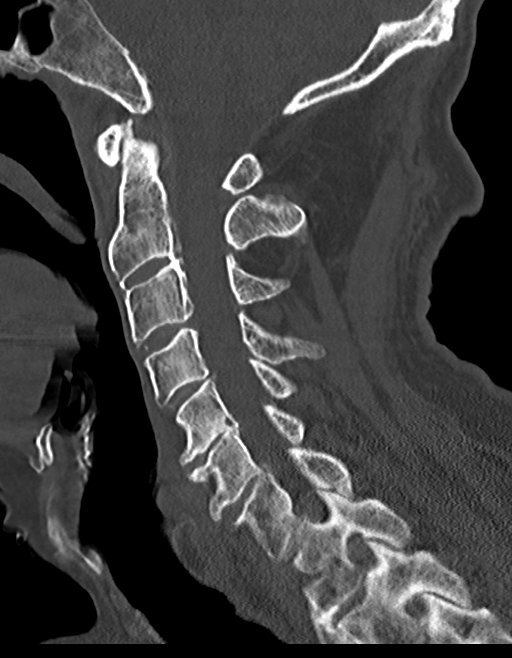
[im 36/61  bone]
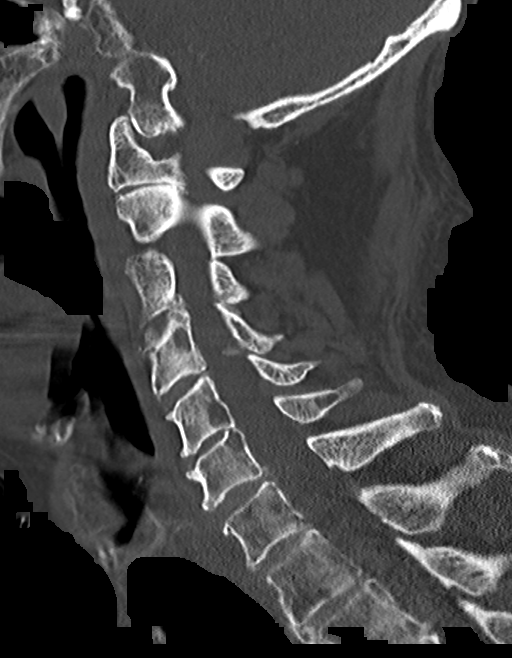
[im 41/61  bone]
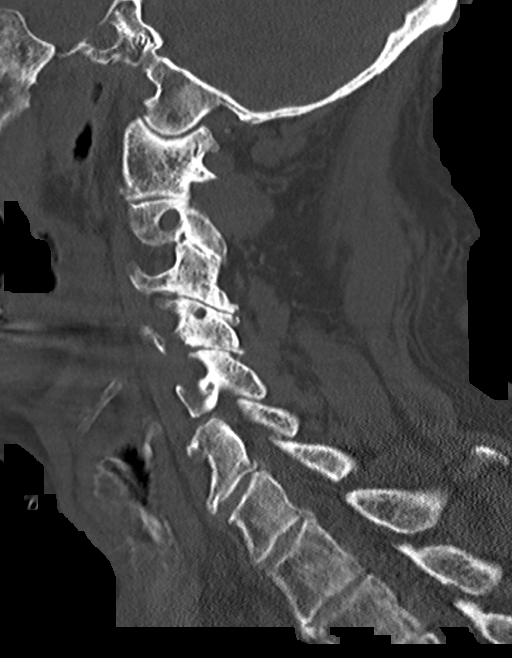

[13 of 33 positions shown; findings below may reference images not displayed]

FINDINGS: Alignment: Normal

Skull base and vertebrae: No acute fracture. No primary bone lesion
or focal pathologic process.

Soft tissues and spinal canal: No prevertebral fluid or swelling. No
visible canal hematoma.

Disc levels:  Diffuse degenerative disc disease and facet disease.

Upper chest: Biapical scarring.  No acute findings

Other: None
IMPRESSION: Cervical spondylosis.  No acute bony abnormality.

## 2021-06-09 IMAGING — CT CT HEAD W/O CM
4 series · 16 of 47 positions shown, 18 images · non-contrast
Comparison: [DATE] p.m.

CLINICAL DATA: Altered mental status, subdural hematoma, follow-up
examination

EXAM:
CT HEAD WITHOUT CONTRAST
TECHNIQUE: Contiguous axial images were obtained from the base of the skull
through the vertex without intravenous contrast.

[Series 3: head wo · axial · 0.45mm/px · z∈[+609,+734]mm · 7 of 35 slices shown, 9 images]
[im 5/35  brain]
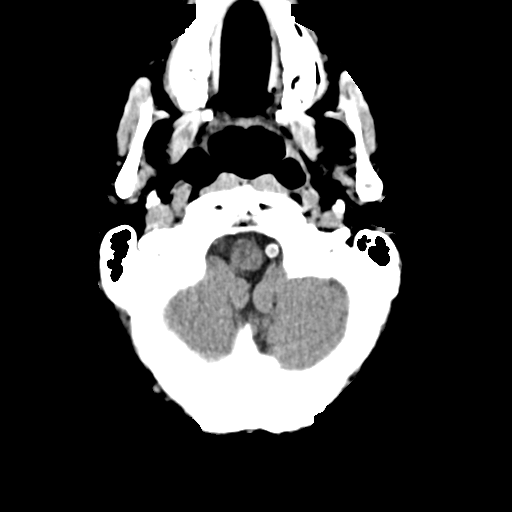
[im 5/35  bone]
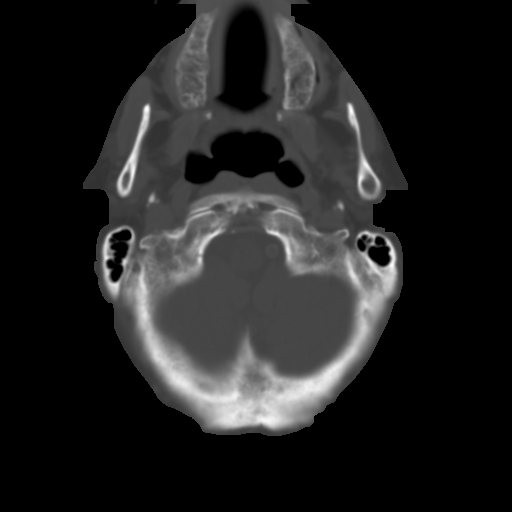
[im 9/35  brain]
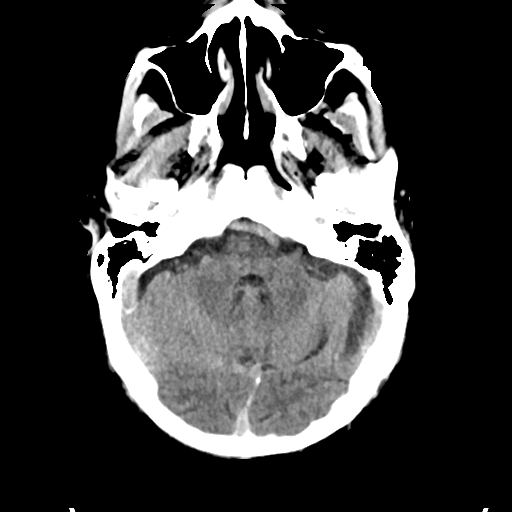
[im 13/35  brain]
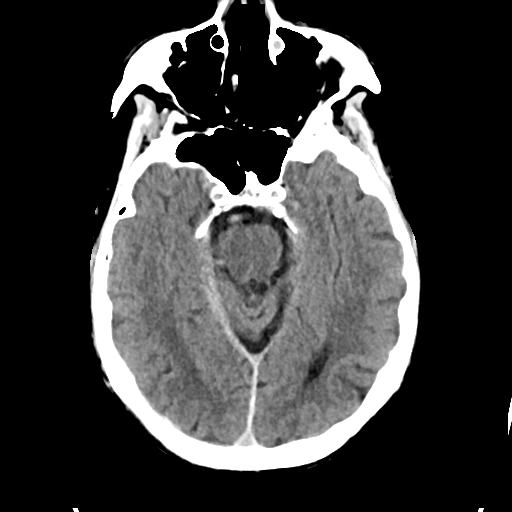
[im 18/35  brain]
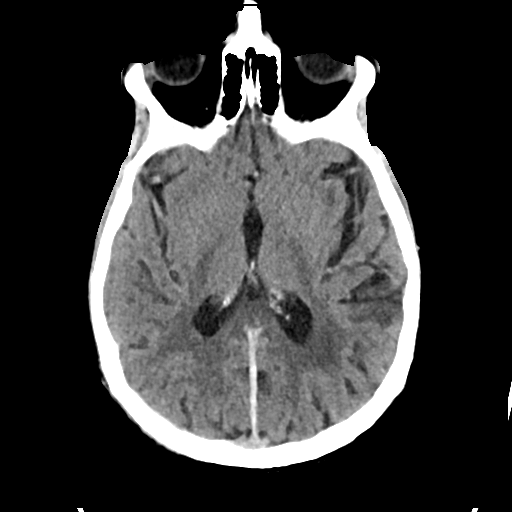
[im 22/35  brain]
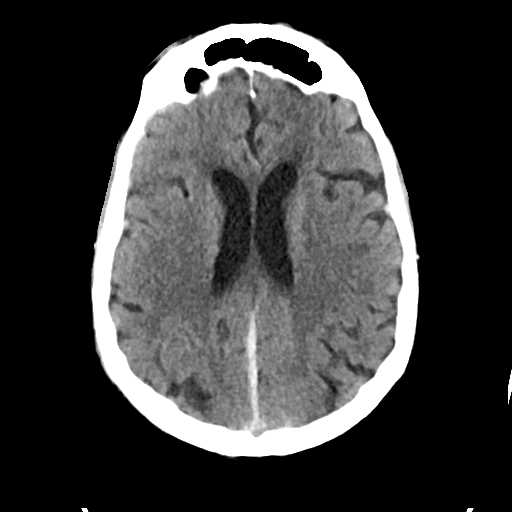
[im 22/35  bone]
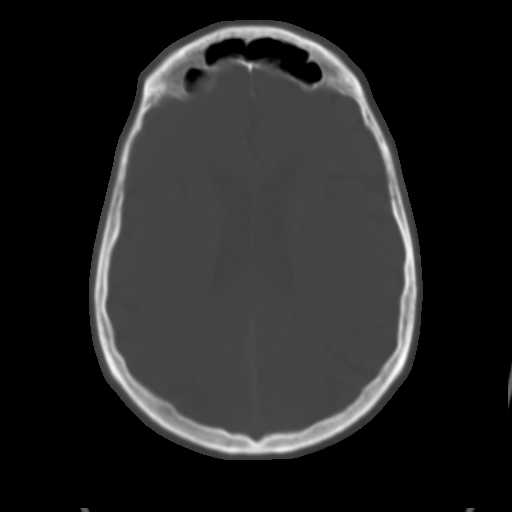
[im 26/35  brain]
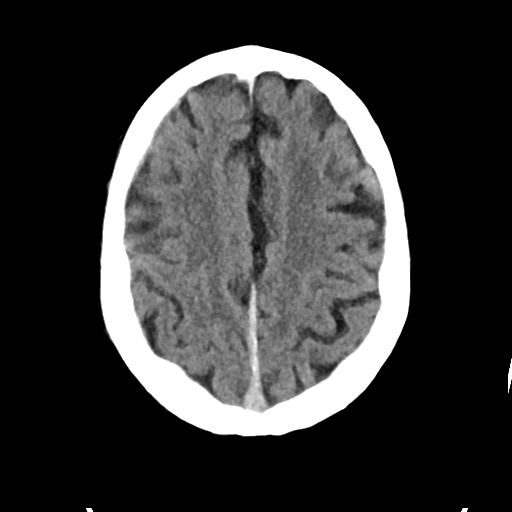
[im 30/35  brain]
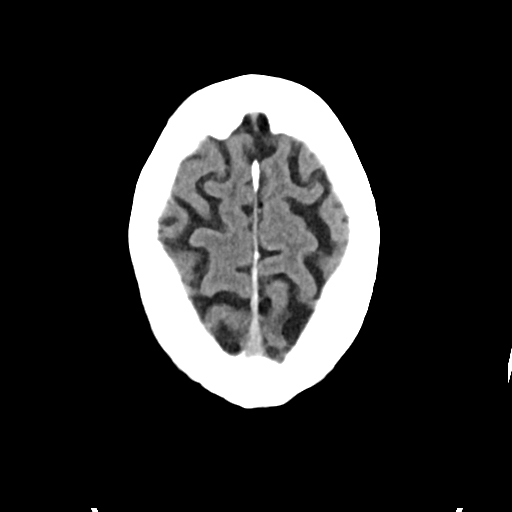

[Series 4: head bone · axial · 0.45mm/px · z∈[+605,+639]mm · 3 of 87 slices shown]
[im 9/87  bone]
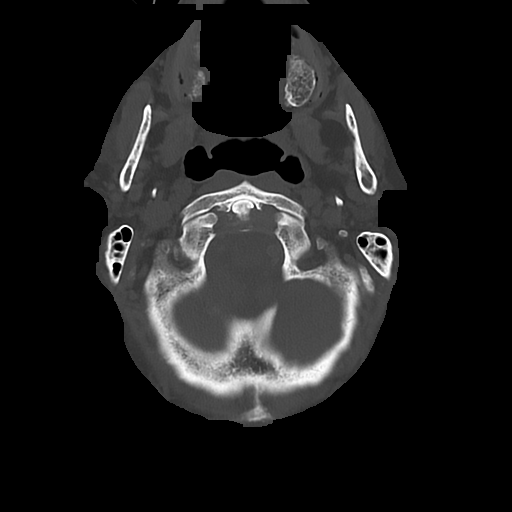
[im 18/87  bone]
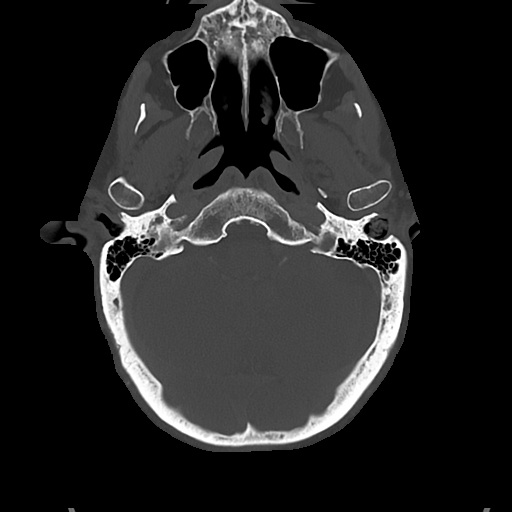
[im 26/87  bone]
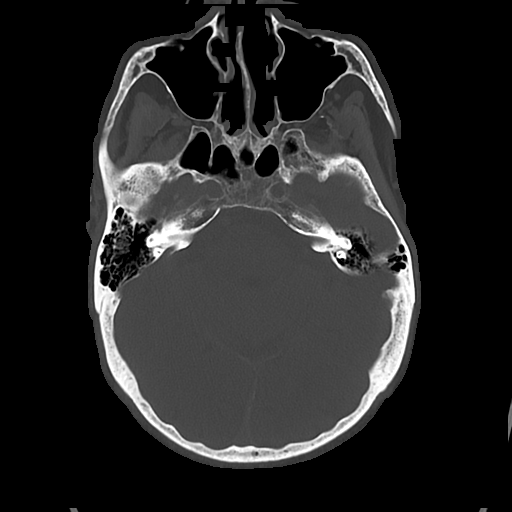

[Series 5: cor soft · coronal · 0.36mm/px · 3 of 79 slices shown]
[im 27/79  brain]
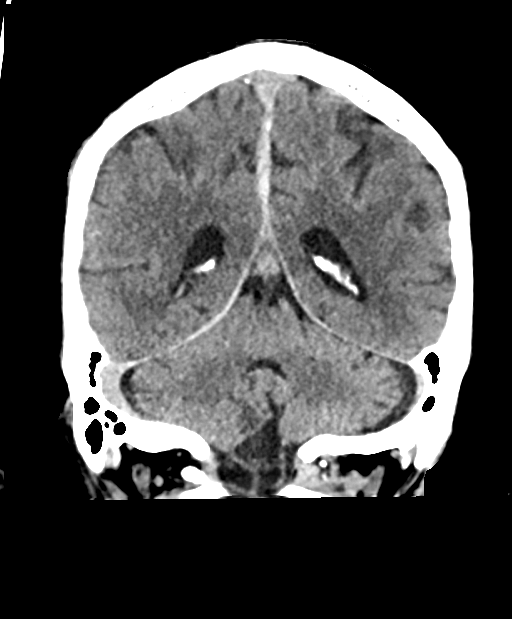
[im 35/79  brain]
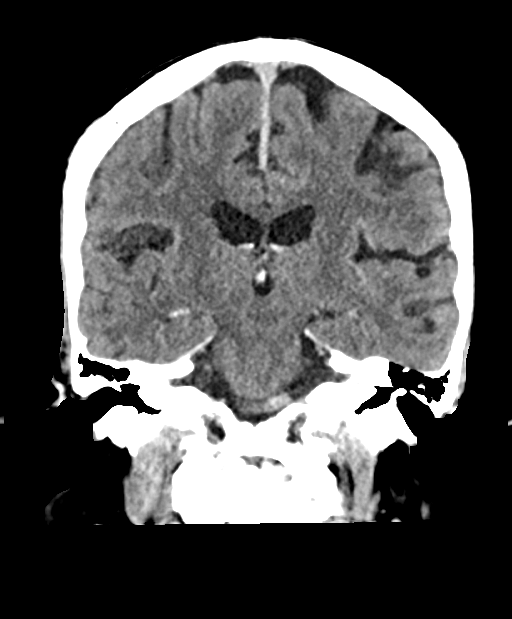
[im 44/79  brain]
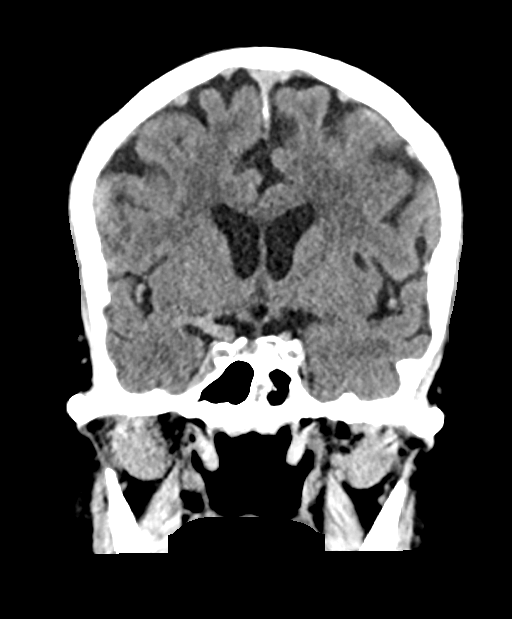

[Series 6: sag soft · sagittal · 0.41mm/px · 3 of 55 slices shown]
[im 19/55  brain]
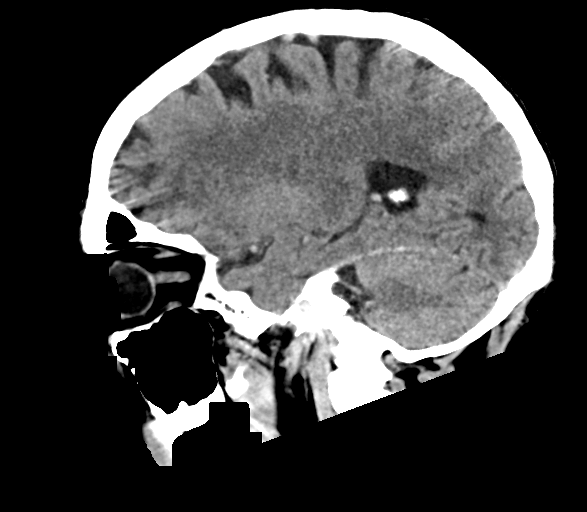
[im 28/55  brain]
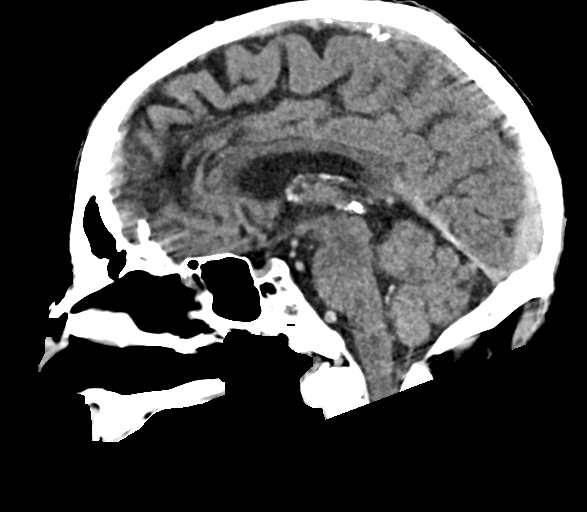
[im 37/55  brain]
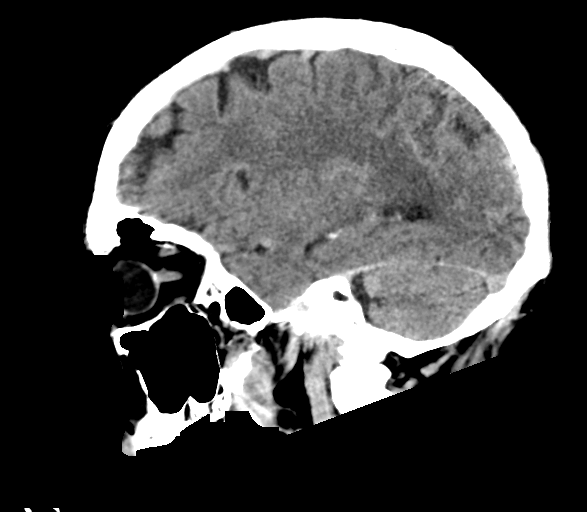

[16 of 47 positions shown; findings below may reference images not displayed]

FINDINGS: Brain: 5 mm posterior para falcine subdural hematoma is stable. No
interval hemorrhage. No associated mass effect or midline shift.
Mild parenchymal volume loss is commensurate with the patient's age.
Mild periventricular white matter changes are present likely
reflecting the sequela of small vessel ischemia. No abnormal intra
or extra-axial mass lesion. Ventricular size is normal. Cerebellum
is unremarkable.

Vascular: No hyperdense vessel or unexpected calcification.

Skull: Normal. Negative for fracture or focal lesion.

Sinuses/Orbits: Small mucous retention cysts within the maxillary
sinuses bilaterally. Paranasal sinuses are otherwise clear. The
orbits are unremarkable.

Other: Mastoid air cells and middle ear cavities are clear.
IMPRESSION: Stable 5 mm posterior para falcine subdural hematoma. No associated
mass effect. No interval hemorrhage.

## 2021-06-09 IMAGING — CT CT ANGIO CHEST
2 of 8 series · 16 of 36 positions shown · IV contrast (Omnipaque)
Comparison: PET CT [DATE].

CLINICAL DATA: Lung cancer.  Prostate cancer.  Fall.

EXAM:
CT ANGIOGRAPHY CHEST
CT ABDOMEN AND PELVIS WITH CONTRAST
TECHNIQUE: He has been hiding her in the CT imaging of the chest was performed
using the standard protocol during bolus administration of
intravenous contrast. Multiplanar CT image reconstructions and MIPs
were obtained to evaluate the vascular anatomy. Multidetector CT
imaging of the abdomen and pelvis was performed using the standard
protocol during bolus administration of intravenous contrast.
CONTRAST:  100mL OMNIPAQUE IOHEXOL 350 MG/ML SOLN

[Series 5: pe coronal mpr · coronal · 0.59mm/px · 1 of 151 slices shown]
[im 76/151  mediastinal]
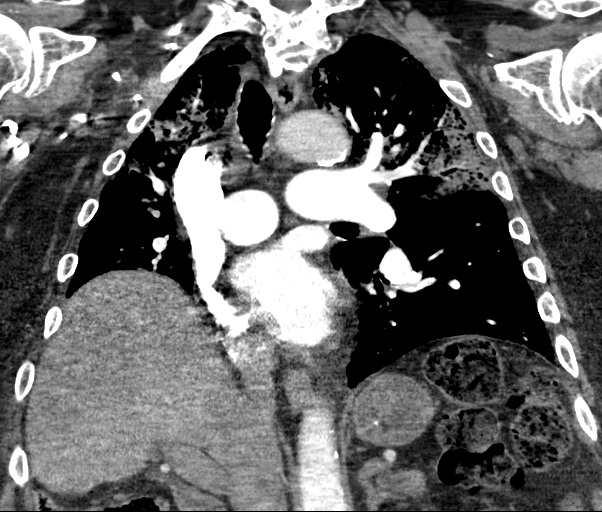

[Series 9: pe thins · axial · 0.87mm/px · z∈[+959,+1217]mm · 15 of 290 slices shown]
[im 16/290  lung]
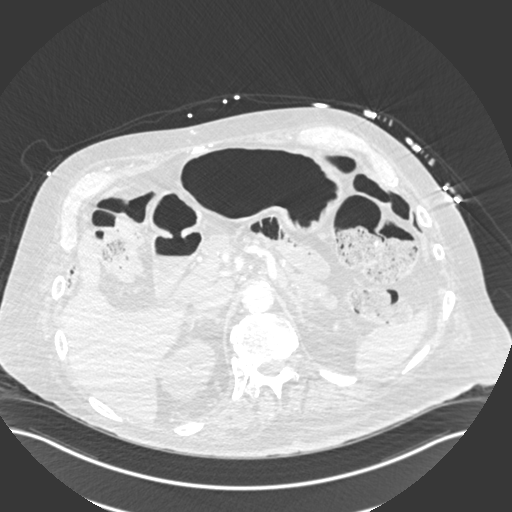
[im 31/290  mediastinal]
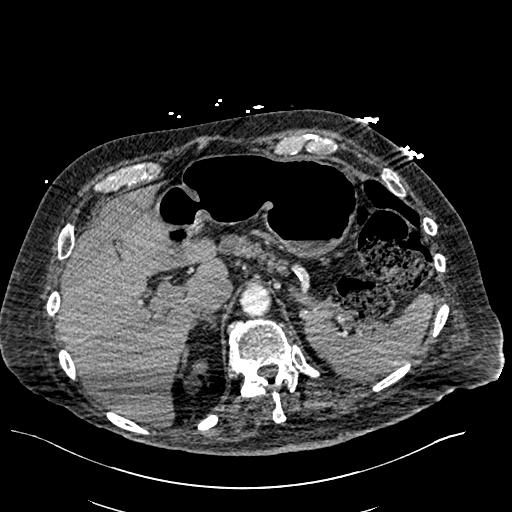
[im 61/290  lung]
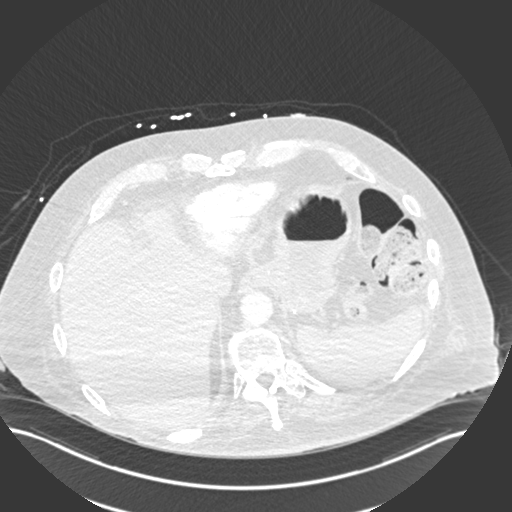
[im 77/290  mediastinal]
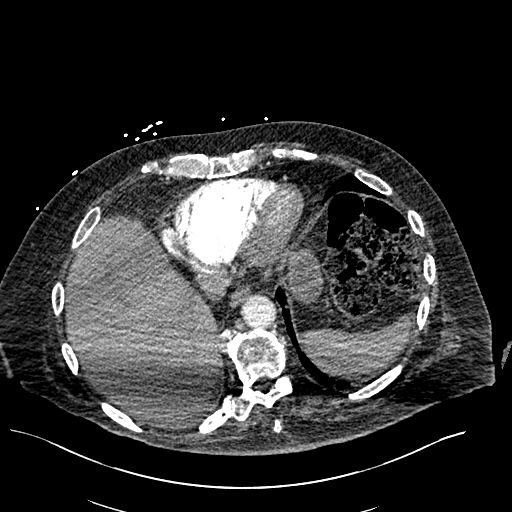
[im 92/290  lung]
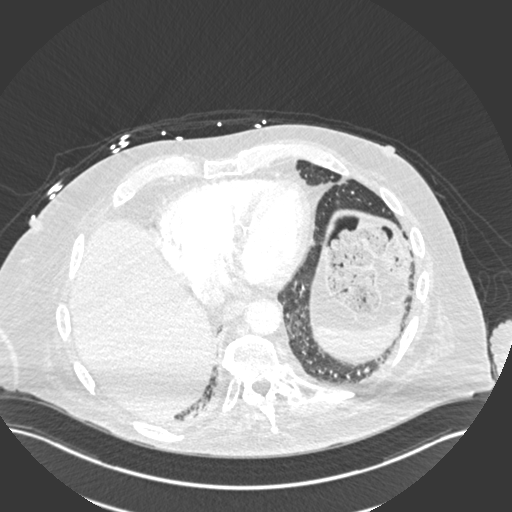
[im 107/290  mediastinal]
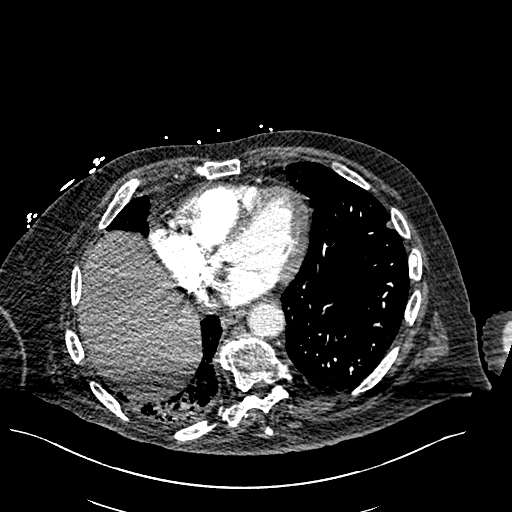
[im 122/290  lung]
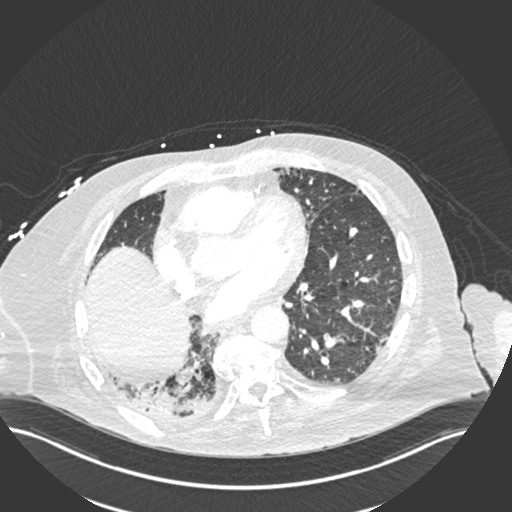
[im 153/290  mediastinal]
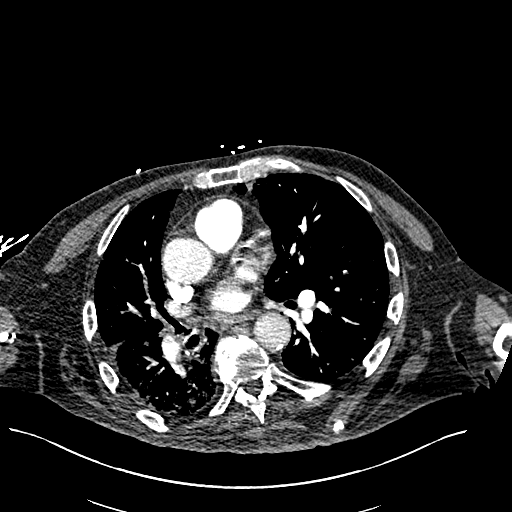
[im 168/290  lung]
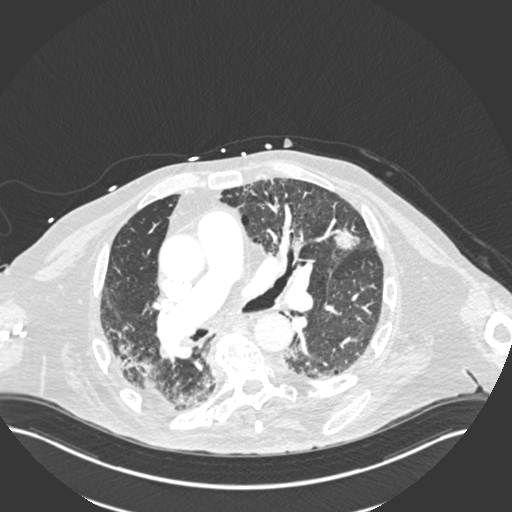
[im 183/290  mediastinal]
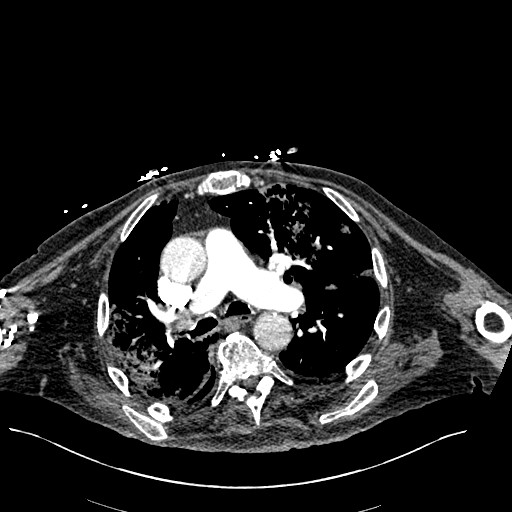
[im 198/290  lung]
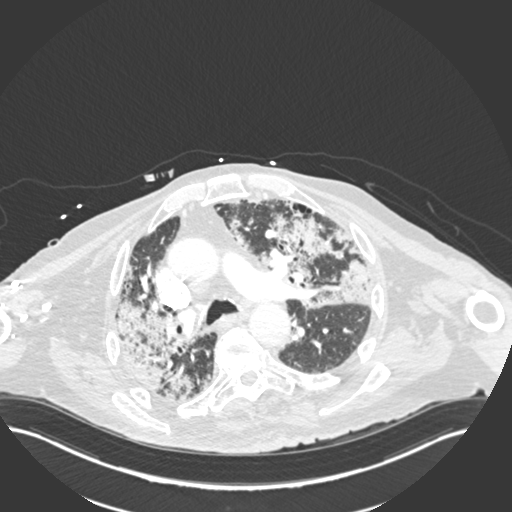
[im 213/290  mediastinal]
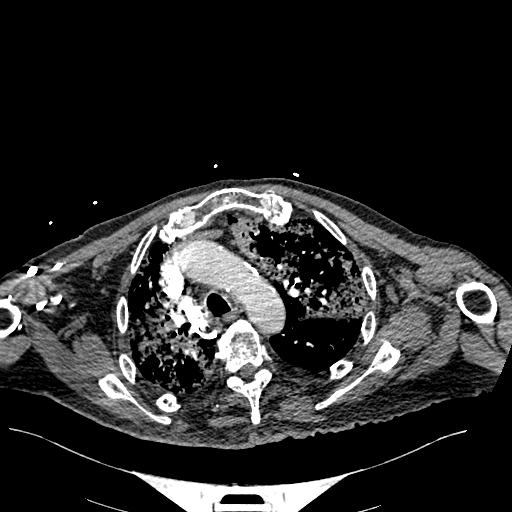
[im 244/290  lung]
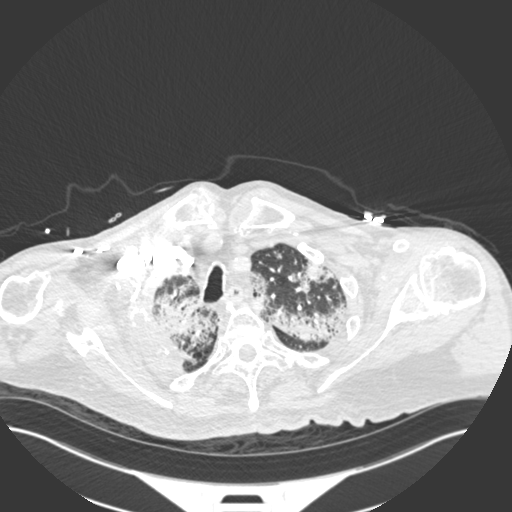
[im 259/290  mediastinal]
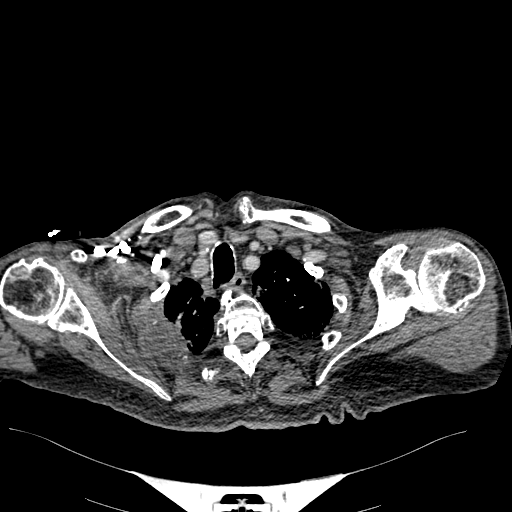
[im 274/290  lung]
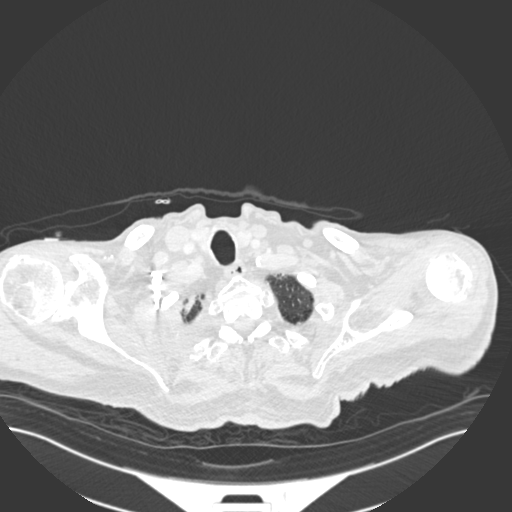

[16 of 36 positions shown; findings below may reference images not displayed]

FINDINGS: CTA CHEST FINDINGS

Cardiovascular: Heart size is upper limits of normal. There is no
pericardial effusion. Aorta is ectatic without focal dilatation.
There are atherosclerotic calcifications of the aorta and coronary
arteries.

There is adequate opacification of the pulmonary arteries. There is
significant motion artifact in the right lung base. There are
findings suspicious for segmental and subsegmental pulmonary emboli
in the right lower lobe. Otherwise, no pulmonary emboli are
visualized.

Mediastinum/Nodes: No enlarged mediastinal, hilar, or axillary lymph
nodes. Thyroid gland, trachea, and esophagus demonstrate no
significant findings.

Lungs/Pleura: Again seen is right apical tumor invading the second
and third ribs measuring proximally 5.3 x 2.9 cm appears similar to
the prior examination. Diffuse multifocal airspace opacities are
seen bilaterally most significant in the upper lobes. Patchy right
lower lobe airspace disease is similar to the prior study. There is
right lower lobe peribronchial thickening, unchanged from prior.
There is no pneumothorax. There is a trace right pleural effusion
similar to the prior study. There secretions in the right mainstem
bronchus.

Musculoskeletal: Osseous invasion are right apical mass involving
the right second and third ribs appears unchanged from the prior
examination. No acute fractures are seen. Multilevel degenerative
changes affect the spine.

Review of the MIP images confirms the above findings.

CT ABDOMEN and PELVIS FINDINGS

Hepatobiliary: No focal liver abnormality is seen. Status post
cholecystectomy. No biliary dilatation.

Pancreas: Again seen is calcification in the pancreatic head. There
is diffuse atrophy of the pancreatic neck, body and tail with ductal
dilatation similar to the prior study. Scattered pancreatic
parenchymal calcifications are seen throughout. Appearance is
unchanged from prior.

Spleen: Normal in size without focal abnormality.

Adrenals/Urinary Tract: There are rounded cortical hypodensities in
both kidneys which are too small to characterize, most likely cysts.
There is an indeterminate lesion in the superior left kidney
measuring 2.4 x 2 point cm image [DATE]. There is no hydronephrosis
or perinephric fluid. Bladder and adrenal glands are within normal
limits.

Stomach/Bowel: Cecum is high riding. Appendix is within normal
limits. There is a large amount of stool throughout the colon. There
is some mildly dilated small bowel loops in the mid abdomen
measuring 3.8 cm without definite transition point. There is no
focal bowel wall thickening or inflammation. Stomach is within
normal limits.

Vascular/Lymphatic: Aorta is normal in size. There are
atherosclerotic calcifications of the aorta and iliac arteries.
Infrarenal IVC filter present likely containing thrombus. No
discrete enlarged lymph nodes identified. Prominent pelvic and
perirectal vessels are again noted.

Reproductive: Prostate gland is enlarged, unchanged.

Other: There is no ascites or focal abdominal wall hernia. There is
subcutaneous edema and hematoma posterior to the right side of the
sacrum and coccyx measuring 6.3 by 2.8 x 3.8 cm.

Musculoskeletal: Mild compression deformity of L3 is unchanged. No
acute fractures are identified. Right ischial metastatic lesion has
minimally increased in size.

Review of the MIP images confirms the above findings.
IMPRESSION: 1. Limited by respiratory motion artifact. There are findings
suspicious for pulmonary emboli within segmental and subsegmental
branches of the right lower lobe.
2. New bilateral multifocal airspace disease throughout the upper
lobes concerning for multifocal infection.
3. Stable right lower lobe airspace disease.
4. Stable right apical mass with rib invasion.
5. Secretions in the right mainstem bronchus.
6. Soft tissue hematoma posterior to the sacrum and coccyx. No
underlying acute fracture.
7. Right ischial metastatic lesion has mildly increased in size.
8. Indeterminate right renal lesion. Recommend further evaluation
with MRI to exclude solid mass.
9. Mildly dilated central small bowel loops, indeterminate. Findings
may related to ileus or enteritis. Developing small bowel
obstruction can not be excluded in the appropriate clinical setting.
10.  Aortic Atherosclerosis ([S7]-[S7]).

## 2021-06-09 IMAGING — CT CT HEAD W/O CM
3 series · 14 of 47 positions shown, 16 images · non-contrast
Comparison: None.

CLINICAL DATA: Head trauma, moderate-severe.  Fall.

EXAM:
CT HEAD WITHOUT CONTRAST
TECHNIQUE: Contiguous axial images were obtained from the base of the skull
through the vertex without intravenous contrast.

[Series 2: head 5.0 h30s · axial · 0.49mm/px · z∈[+1356,+1496]mm · 8 of 34 slices shown, 10 images]
[im 3/34  brain]
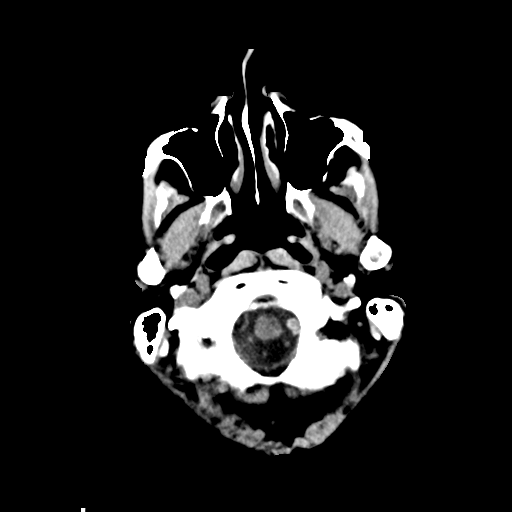
[im 3/34  bone]
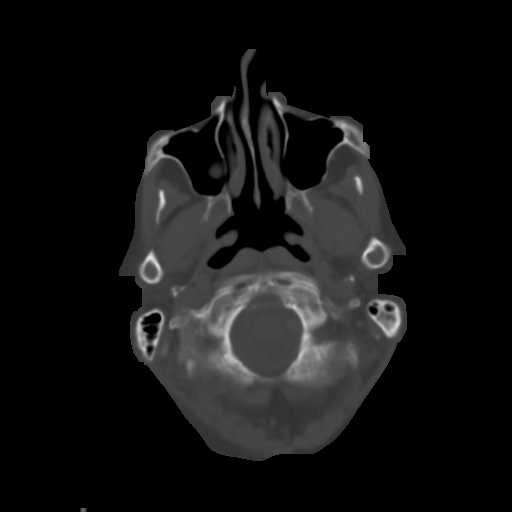
[im 7/34  brain]
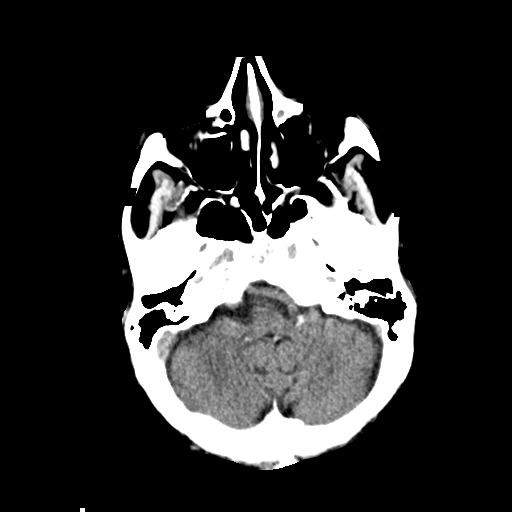
[im 11/34  brain]
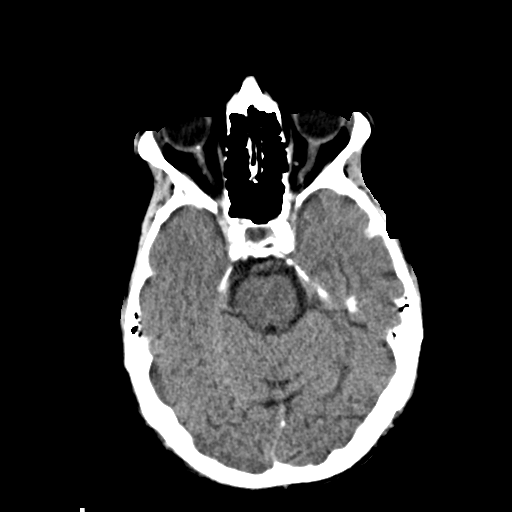
[im 15/34  brain]
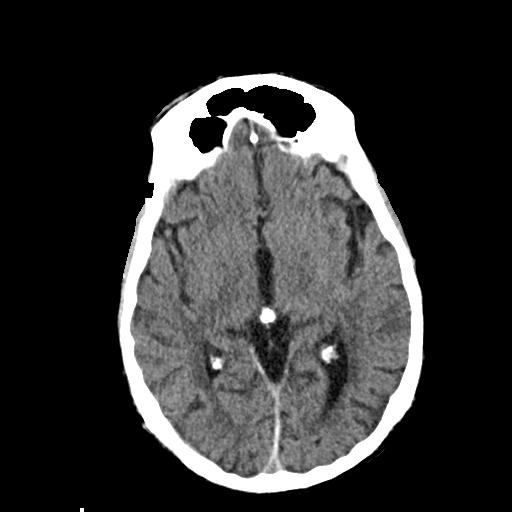
[im 19/34  brain]
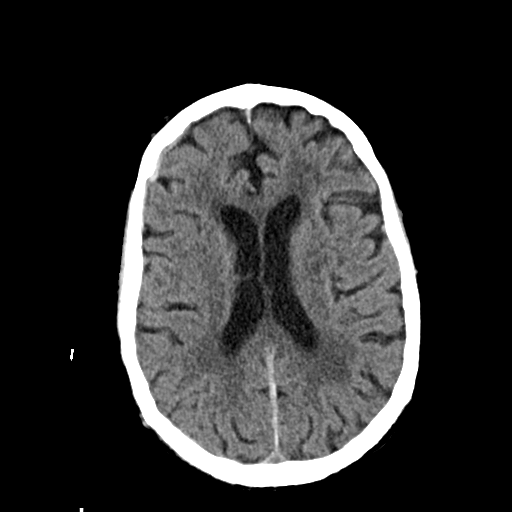
[im 19/34  bone]
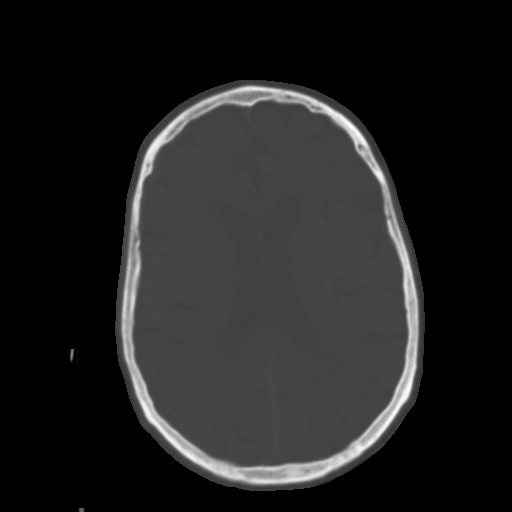
[im 23/34  brain]
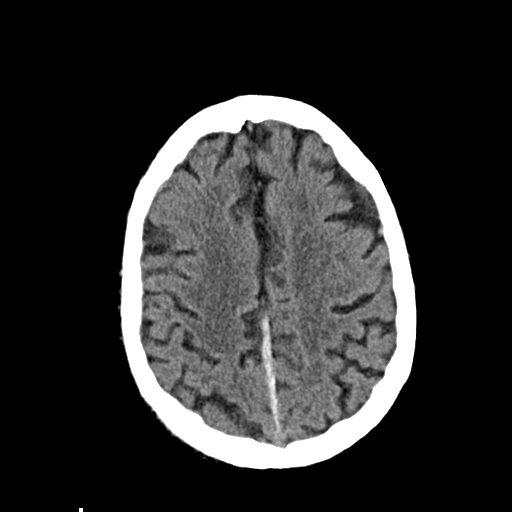
[im 27/34  brain]
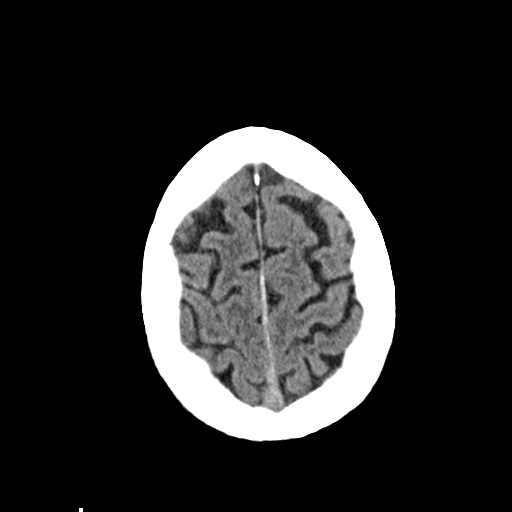
[im 31/34  brain]
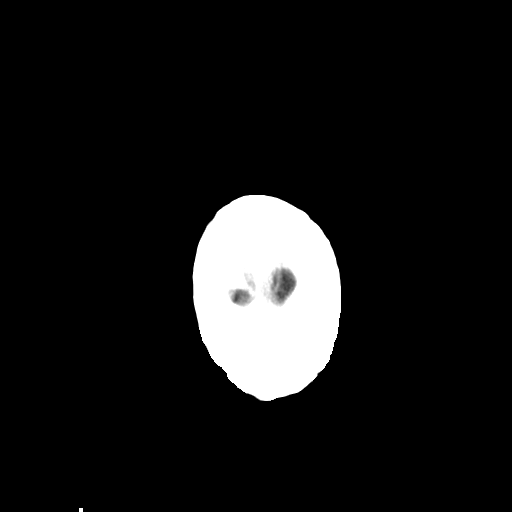

[Series 5: head 3.0 mpr cor · coronal · 0.33mm/px · 3 of 76 slices shown]
[im 26/76  brain]
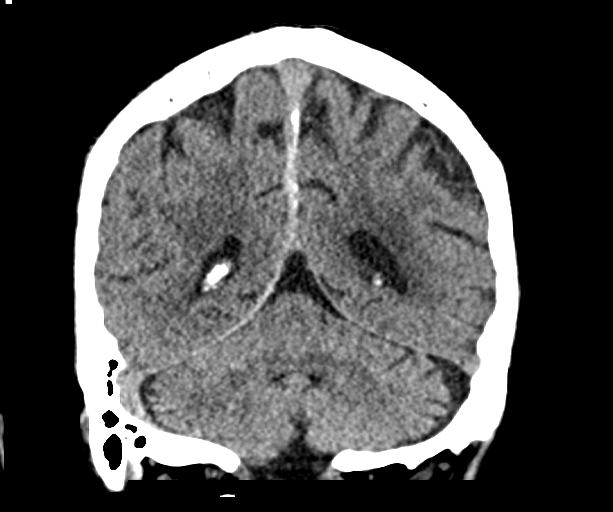
[im 34/76  brain]
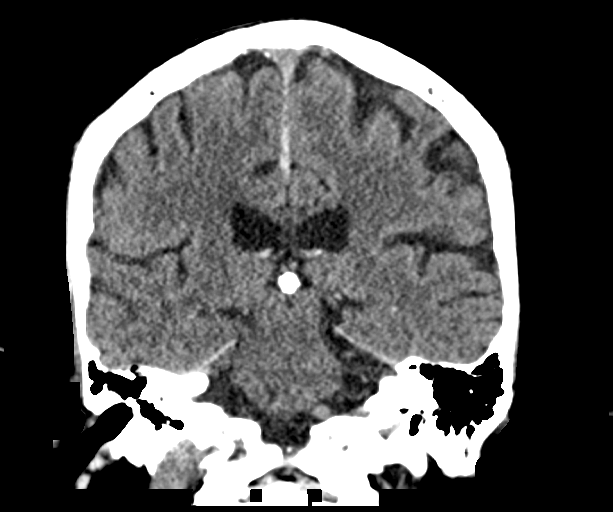
[im 42/76  brain]
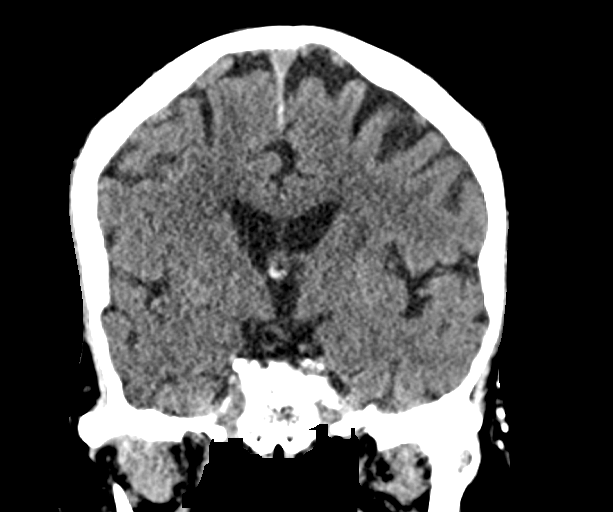

[Series 6: head 3.0 mpr sag · sagittal · 0.33mm/px · 3 of 67 slices shown]
[im 23/67  brain]
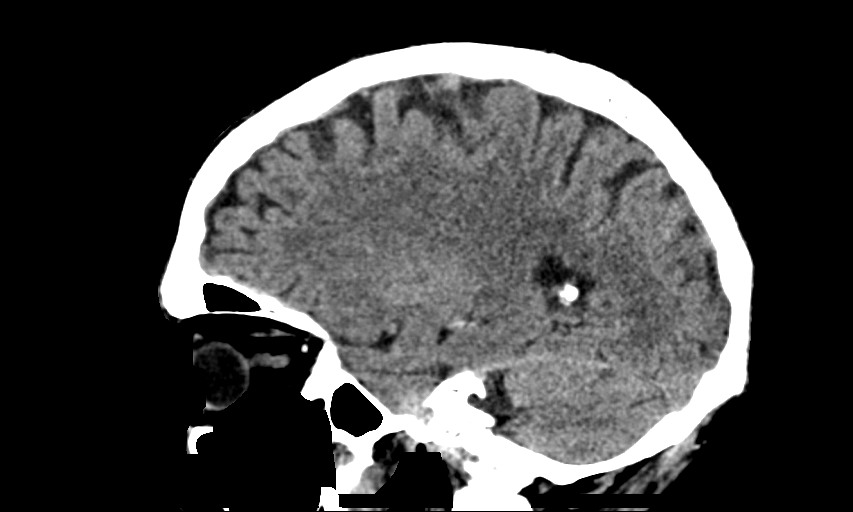
[im 34/67  brain]
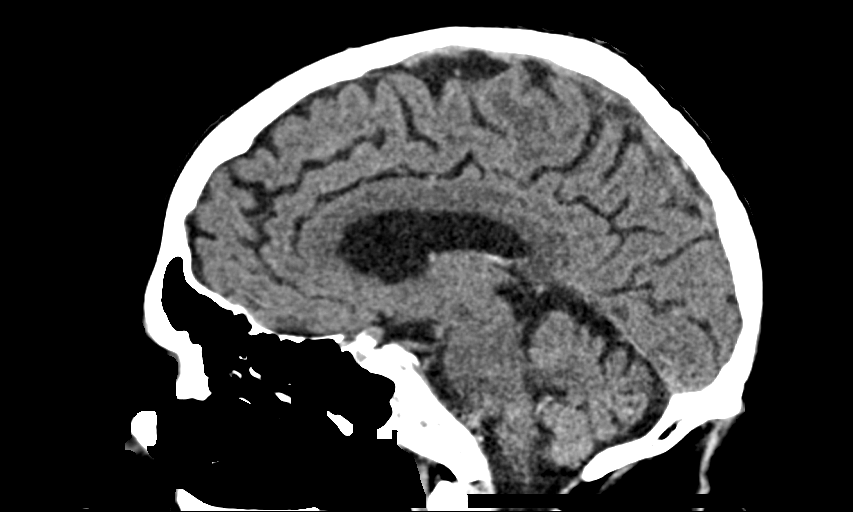
[im 45/67  brain]
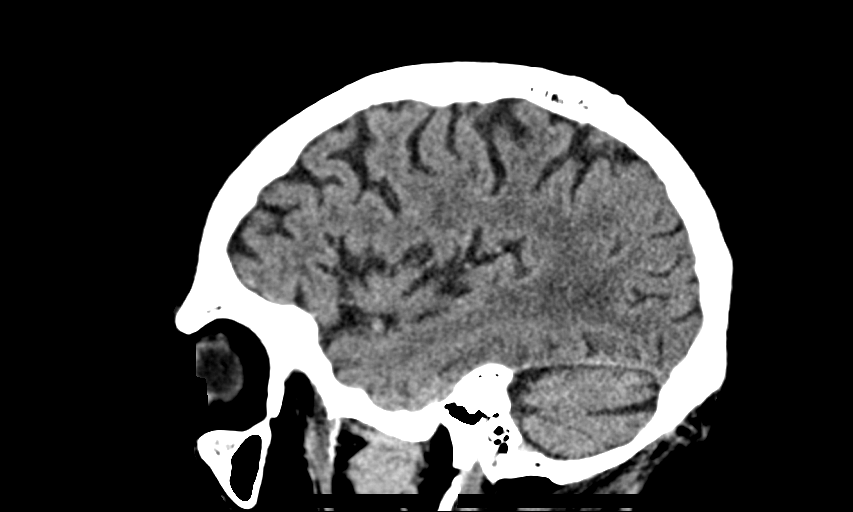

[14 of 47 positions shown; findings below may reference images not displayed]

FINDINGS: Brain: There is a subdural hematoma along the mid to posterior falx
measuring 5 mm in thickness. No mass effect or midline shift. There
is atrophy and chronic small vessel disease changes. No acute
infarction. No hydrocephalus.

Vascular: No hyperdense vessel or unexpected calcification.

Skull: No acute calvarial abnormality.

Sinuses/Orbits: No acute findings

Other: None
IMPRESSION: 5 mm parafalcine subdural hematoma.

Atrophy, chronic small vessel disease.

## 2021-06-09 MED ORDER — LEVETIRACETAM IN NACL 500 MG/100ML IV SOLN
500.0000 mg | Freq: Two times a day (BID) | INTRAVENOUS | Status: DC
  Administered 2021-06-10 – 2021-06-13 (×6): 500 mg via INTRAVENOUS
  Filled 2021-06-09 (×6): qty 100

## 2021-06-09 MED ORDER — VITAMIN K1 10 MG/ML IJ SOLN
INTRAMUSCULAR | Status: AC
  Filled 2021-06-09: qty 1

## 2021-06-09 MED ORDER — PROTHROMBIN COMPLEX CONC HUMAN 500 UNITS IV KIT: 5000.0000 [IU] | PACK | Status: DC

## 2021-06-09 MED ORDER — POLYETHYLENE GLYCOL 3350 17 G PO PACK
17.0000 g | PACK | Freq: Every day | ORAL | Status: DC | PRN
  Filled 2021-06-09: qty 1

## 2021-06-09 MED ORDER — IOHEXOL 350 MG/ML SOLN
100.0000 mL | Freq: Once | INTRAVENOUS | Status: AC | PRN
  Administered 2021-06-09: 100 mL via INTRAVENOUS

## 2021-06-09 MED ORDER — SODIUM CHLORIDE 0.9 % IV SOLN: INTRAVENOUS | Status: DC | PRN

## 2021-06-09 MED ORDER — DOCUSATE SODIUM 100 MG PO CAPS: 100.0000 mg | ORAL_CAPSULE | Freq: Two times a day (BID) | ORAL | Status: DC | PRN

## 2021-06-09 MED ORDER — SODIUM CHLORIDE 0.9 % IV SOLN
500.0000 mg | Freq: Once | INTRAVENOUS | Status: AC
  Administered 2021-06-09: 500 mg via INTRAVENOUS
  Filled 2021-06-09: qty 5

## 2021-06-09 MED ORDER — SODIUM CHLORIDE 0.9 % IV SOLN
1.0000 g | Freq: Once | INTRAVENOUS | Status: AC
  Administered 2021-06-09: 1 g via INTRAVENOUS
  Filled 2021-06-09: qty 10

## 2021-06-09 MED ORDER — VITAMIN K1 10 MG/ML IJ SOLN
5.0000 mg | Freq: Once | INTRAVENOUS | Status: AC
  Administered 2021-06-09: 5 mg via INTRAVENOUS
  Filled 2021-06-09: qty 0.5

## 2021-06-09 MED ORDER — LEVETIRACETAM IN NACL 1000 MG/100ML IV SOLN
1000.0000 mg | Freq: Once | INTRAVENOUS | Status: AC
  Administered 2021-06-09: 1000 mg via INTRAVENOUS
  Filled 2021-06-09: qty 100

## 2021-06-09 MED ORDER — VITAMIN K1 10 MG/ML IJ SOLN
10.0000 mg | INTRAVENOUS | Status: DC
  Filled 2021-06-09: qty 1

## 2021-06-09 NOTE — ED Provider Notes (Signed)
Care was taken over from Dr. Tinnie Gens.  Patient has a complex medical history with active metastatic prostate cancer with mets to his lungs/ribs.  He comes in after mechanical fall last night.  He was found to be hypoxic with oxygen saturations in the 70s.  He is currently doing well on a Venturi mask.  His blood pressure is soft.  He had a CT scan of his head which shows evidence of a small subdural hematoma.  CT scan of his chest abdomen pelvis shows multifocal patchy infiltrates concerning for infection, hematoma in his pelvis/sacrum without underlying fracture and PEs in his right segmental/subsegmental areas.  His INR is over 8.  Initially he regained do a rapid reversal however given his PEs, neurosurgery was consulted.  We spoke with the PA [facility] working with Dr. Arnoldo Morale.  They reviewed the scans and felt that it was so small, given the PEs, it would be better to treat the PEs rather than do rapid reversal.  I did discuss this with Dr. Carlis Abbott with the ICU.  We have decided to go ahead and give the patient vitamin K 5 mg to slowly bring his Coumadin down and then they can transition him to likely heparin.  Currently his mental status is stable.  He will be admitted to the ICU at [facility].  He was started on antibiotics for his potential pneumonia.  COVID test is pending.  CRITICAL CARE Performed by: Malvin Johns Total critical care time: 30 minutes Critical care time was exclusive of separately billable procedures and treating other patients. Critical care was necessary to treat or prevent imminent or life-threatening deterioration. Critical care was time spent personally by me on the following activities: development of treatment plan with patient and/or surrogate as well as nursing, discussions with consultants, evaluation of patient's response to treatment, examination of patient, obtaining history from patient or surrogate, ordering and performing treatments and interventions, ordering and review  of laboratory studies, ordering and review of radiographic studies, pulse oximetry and re-evaluation of patient's condition.      Malvin Johns, MD 06/07/2021 [phone]

## 2021-06-09 NOTE — ED Provider Notes (Signed)
[facility]PARTMENT Provider Note   CSN: [id number] Arrival date & time: 06/27/2021  1222     History Chief Complaint  Patient presents with   Adrian Foster is a 87 y.o. male PMH CAD status post MI, DVT/PE on Coumadin, T2DM, metastatic prostate cancer with mets to the lungs on active radiation therapy who presents the emergency department after a fall.  Patient had a mechanical fall yesterday and spoke with his primary oncologist who recommended that he come to the emergency department for evaluation as he is anticoagulated.  Patient woke up this morning with no symptoms but arrives hypoxic to 75% on room air with a good pleth.  He denies chest pain, abdominal pain, nausea, vomiting, diarrhea, numbness, tingling, weakness of the upper or lower extremities or other systemic or neurologic complaints.   Fall Pertinent negatives include no chest pain, no abdominal pain and no shortness of breath.      Past Medical History:  Diagnosis Date   Diabetes mellitus without complication ([place])    DVT (deep venous thrombosis) (H[facility])    Hypertension    MI (myocardial infarction) ([place])    Pulmonary embolism ([place])     There are no problems to display for this patient.   Past Surgical History:  Procedure Laterality Date   CHOLECYSTECTOMY     REPLACEMENT TOTAL KNEE BILATERAL     VENA CAVA FILTER PLACEMENT         No family history on file.  Social History   Tobacco Use   Smoking status: Former   Smokeless tobacco: Never  Scientific laboratory technician Use: Never used  Substance Use Topics   Alcohol use: No   Drug use: No    Home Medications Prior to Admission medications   Medication Sig Start Date End Date Taking? Authorizing Provider  allopurinol (ZYLOPRIM) 300 MG tablet Take 300 mg by mouth daily.    [provider]  aspirin 81 MG tablet Take 81 mg by mouth daily.    [provider]  carvedilol (COREG) 3.125 MG tablet Take 3.125 mg by  mouth 2 (two) times daily with a meal.    [provider]  glipiZIDE (GLUCOTROL XL) 2.5 MG 24 hr tablet Take 2.5 mg by mouth daily.    [provider]  losartan (COZAAR) 25 MG tablet Take 25 mg by mouth daily.    [provider]  metFORMIN (GLUCOPHAGE) 1000 MG tablet Take 1,000 mg by mouth 2 (two) times daily with a meal.    [provider]  metolazone (ZAROXOLYN) 2.5 MG tablet Take 2.5 mg by mouth daily.    [provider]  potassium chloride (KLOR-CON) 20 MEQ packet Take 20 mEq by mouth 2 (two) times daily.    [provider]  simvastatin (ZOCOR) 20 MG tablet Take 20 mg by mouth every evening.    [provider]  torsemide (DEMADEX) 10 MG tablet Take 10 mg by mouth daily.    [provider]  warfarin (COUMADIN) 5 MG tablet Take 5 mg by mouth daily.    [provider]    Allergies    Patient has no known allergies.  Review of Systems   Review of Systems  Constitutional:  Negative for chills and fever.  HENT:  Negative for ear pain and sore throat.   Eyes:  Negative for pain and visual disturbance.  Respiratory:  Negative for cough and shortness of breath.   Cardiovascular:  Negative for chest pain and palpitations.  Gastrointestinal:  Negative for abdominal pain and vomiting.  Genitourinary:  Negative for dysuria and hematuria.  Musculoskeletal:  Negative for arthralgias and back pain.  Skin:  Negative for color change and rash.  Neurological:  Negative for seizures and syncope.  All other systems reviewed and are negative.  Physical Exam Updated Vital Signs BP 116/70   Pulse 90   Temp 98.2 F (36.8 C) (Oral)   Resp 17   Ht 6\' 3"  (1.905 m)   Wt 108.9 kg   SpO2 95%   BMI 30.00 kg/m   Physical Exam Vitals and nursing note reviewed.  Constitutional:      General: He is not in acute distress.    Appearance: He is well-developed.  HENT:     Head: Normocephalic and atraumatic.  Eyes:      Conjunctiva/sclera: Conjunctivae normal.  Cardiovascular:     Rate and Rhythm: Normal rate and regular rhythm.     Heart sounds: No murmur heard. Pulmonary:     Effort: Pulmonary effort is normal. No respiratory distress.     Breath sounds: Rales present.  Abdominal:     Palpations: Abdomen is soft.     Tenderness: There is no abdominal tenderness.  Musculoskeletal:        General: No swelling.     Cervical back: Neck supple.  Skin:    General: Skin is warm and dry.     Capillary Refill: Capillary refill takes less than 2 seconds.  Neurological:     Mental Status: He is alert.  Psychiatric:        Mood and Affect: Mood normal.    ED Results / Procedures / Treatments   Labs (all labs ordered are listed, but only abnormal results are displayed) Labs Reviewed  CBC WITH DIFFERENTIAL/PLATELET - Abnormal; Notable for the following components:      Result Value   Hemoglobin 12.7 (*)    RDW 16.6 (*)    nRBC 0.7 (*)    Neutro Abs 8.9 (*)    Lymphs Abs 0.3 (*)    All other components within normal limits  PROTIME-INR - Abnormal; Notable for the following components:   Prothrombin Time 72.4 (*)    INR 8.8 (*)    All other components within normal limits  TROPONIN I (HIGH SENSITIVITY) - Abnormal; Notable for the following components:   Troponin I (High Sensitivity) 91 (*)    All other components within normal limits  COMPREHENSIVE METABOLIC PANEL    EKG None  Radiology DG Chest Portable 1 View  Result Date: 06/16/2021 CLINICAL DATA:  Trauma, fall EXAM: PORTABLE CHEST 1 VIEW COMPARISON:  Images of previous study done on 12/30/2020 are not available for comparison. FINDINGS: Transverse diameter of heart is within normal limits. Thoracic aorta is tortuous and ectatic. There is decreased volume in right lung. Increased interstitial markings are seen in both upper lung fields. There is crowding of markings in the lower lung fields. There is no significant pleural effusion or  pneumothorax. IMPRESSION: Increased interstitial markings are seen in both lungs more so in both upper lung fields. There is decreased volume in right lung. Findings may suggest scarring from fibrosis. Possibility of superimposed pneumonia is not excluded. Electronically Signed   By: Elmer Picker M.D.   On: 06/05/2021 13:37    Procedures .Critical Care Performed by: Teressa Lower, MD Authorized by: Teressa Lower, MD   Critical care provider statement:    Critical care time (  minutes):  30   Critical care was necessary to treat or prevent imminent or life-threatening deterioration of the following conditions:  Respiratory failure   Critical care was time spent personally by me on the following activities:  Development of treatment plan with patient or surrogate, discussions with consultants, evaluation of patient's response to treatment, examination of patient, ordering and review of laboratory studies, ordering and review of radiographic studies, ordering and performing treatments and interventions, pulse oximetry, re-evaluation of patient's condition and review of old charts   Medications Ordered in ED Medications - No data to display  ED Course  I have reviewed the triage vital signs and the nursing notes.  Pertinent labs & imaging results that were available during my care of the patient were reviewed by me and considered in my medical decision making (see chart for details).    MDM Rules/Calculators/A&P                           Patient seen emergency department for evaluation of mechanical fall on thinners.  Physical exam reveals an ill-appearing patient with rales bilaterally, decreased breath sounds on the right.  No external evidence of trauma.  Laboratory evaluation with hyponatremia 133, hypochloremia to 89, significantly elevated INR to 8.8, initial troponin 91 with delta troponin 67.  Chest x-ray concerning for worsening fibrosis and scarring in the right lung.  He had  with a 5 mm parafalcine subdural hematoma with no mass-effect or midline shift.  CT C-spine unremarkable.  CT PE suspicious for pulmonary emboli within segmental and subsegmental branches of the right lower lobe, new bilateral multifocal airspace disease concerning for multifocal infection, stable right apical mass with rib invasion, soft tissue hematoma posterior to the sacrum and coccyx with no underlying fracture.  Patient been signed out to oncoming provider.  Patient will certainly need to be admitted and a page has been sent out to neurosurgery to decide risk benefits of INR reversal in the setting of an active pulmonary embolism. Final Clinical Impression(s) / ED Diagnoses Final diagnoses:  None    Rx / DC Orders ED Discharge Orders     None        Jennings Corado, MD 06/20/2021 1625

## 2021-06-09 NOTE — H&P (Signed)
NAME:  Adrian Foster, MRN:  [id number], DOB:  22-Jun-1934, LOS: 0 ADMISSION DATE:  06/06/2021, CONSULTATION DATE:  12/8 REFERRING MD:  Dr. Matilde Sprang, CHIEF COMPLAINT:  hypoxic respiratory failure/ PE  History of Present Illness:  Patient is a 87 yo M w/ pertinent pmh of CAD s/p MI, DVT/PE on Coumadin, T2DM, HTN, metastatic prostate cancer w/ mets to lungs on active radiation presents to [facility] on 12/8 after mechanical fall the night prior on coumadin oncologist recommended he come to ED.  On arrival to [facility] ED on 12/8, patient was found to be hypoxic in 70s on room air. Sats improved with supplemental O2. Patient has been taking Coumadin for subsegmental PE outpatient. INR was 8.8. CT chest shows PE within segmental and subsegmental branches on RLL. CT head showed 5 mm parafalcine subdural hematoma. Neurosurgery consulted and recommend no surgery and recommend not severe enough to reverse anticoagulation. Vitamin K given to improve INR to allow [place] to start patient on heparin drip. CXR shows possible RLL pneumonia; given ceftriaxone/azithromycin.  PCCM consulted for ICU admission and medical management.    Pertinent  Medical History   Past Medical History:  Diagnosis Date   Diabetes mellitus without complication ([place])    DVT (deep venous thrombosis) ([place])    Hypertension    MI (myocardial infarction) ([place])    Pulmonary embolism ([place])      Significant Hospital Events: Including procedures, antibiotic start and stop dates in addition to other pertinent events   12/8: admitted to [facility] on 12/8 for hypoxia  Interim History / Subjective:  Pt arrived to the room and developed acute neuro changes.  Aphasic,  not able to lift arms off the bed.  Intermittently following commands   Objective   Blood pressure 110/72, pulse 95, temperature 98.2 F (36.8 C), temperature source Oral, resp. rate 20, height 6\' 3"  (1.905 m), weight 108.9 kg, SpO2 97 %.    FiO2 (%):  [35 %-40 %] 40 %  No intake or output  data in the 24 hours ending 06/14/2021 1700 Filed Weights   06/13/2021 1241  Weight: 108.9 kg    Examination: General: No Acute distress  HENT: Atraumatic normocephalic  Lungs: minimal crackle at the right base otherwise clear Cardiovascular: Regular rate Abdomen: soft non tender non distended +BS Extremities: No cyanosis/ clubbing  Distal pulses intact x4 + 3 left lower extem +2 right lower extrem edema Neuro: Awake and alert follows commands with hands and feet briskly,  will weakly lift heels off bed but does not lift arms.  Nonverbal  + cough and gag  Pupils are equal and reactive at 23mm  Skin: no rash  Labs/imaging  12/8 CXR: decreased lung volumes on right; possible pneumonia  12/8 CT angio chest 1. Limited by respiratory motion artifact. There are findings suspicious for pulmonary emboli within segmental and subsegmental branches of the right lower lobe. 2. New bilateral multifocal airspace disease throughout the upper lobes concerning for multifocal infection. 3. Stable right lower lobe airspace disease. 4. Stable right apical mass with rib invasion. 5. Secretions in the right mainstem bronchus.  12/8 CT abdomen 1. Right ischial metastatic lesion has mildly increased in size. 8. Indeterminate right renal lesion. Recommend further evaluation with MRI to exclude solid mass. 9. Mildly dilated central small bowel loops, indeterminate. Findings may related to ileus or enteritis. Developing small bowel obstruction can not be excluded in the appropriate clinical setting.   Assessment & Plan:   ALOC  Pt acutely decompensated on  arrival to the ICU.   Stat Head CT unchanged Consulted Neurology Discussed with Dr Leonel Ramsay at the bedside.  Concerned with seizure AEM per neuro MRI and EEG  Segmental/subsegmental RLL PE: 12/8: CT chest shows PE within segmental and subsegmental branches on RLL; On coumadin at home; INR 8.8 DVT hx P: -admit to ICU for telemetry  monitoring -supplemental O2 for sats >92% -given vitamin K in ED -trend INR -start Heparin when INR in therapeutic range -trend troponin -consider echo   Possible RLL Pneumonia P: -IS -continue ceftriaxone and azithromycin for cap ppx -check procalcitonin -trend cxr -flu/covid pending -consider sending expectorated sputum  Small SDH: Ct head 12/8 5 mm parafalcine subdural hematoma  P: -neurosurgery consulted; no surgery recommended -frequent neuro checks -prn labetalol; SBP goal <160 -restart home statin -CBG goal 140-180 -avoid fever  Hyponatremia P: -trend BMP  DMT2 P: -SSI and CBG monitoring -check a1c  HTN HLD Hx of MI P: -restart home bp meds -continue home statin -consider restarting ASA tomorrow  Possible ileus vs. Enteritis P: -bowel regimen -monitor bowel movements  Metastatic prostate cancer w/ mets to lungs: on active radiation Right ischial metastatic lesion: seen on CT 12/8 P: -supportive care -f/u outpatient  Indeterminate right renal lesion P: -recommend MRI for further follow up when stable  Best Practice (right click and "Reselect all SmartList Selections" daily)   Diet/type: NPO w/ oral meds DVT prophylaxis: other; will start on heparin when INR therapeutic level GI prophylaxis: PPI Lines: N/A Foley:  N/A Code Status:  full code Last date of multidisciplinary goals of care discussion [pending]  Labs   CBC: Recent Labs  Lab 06/19/2021 1303  WBC 9.9  NEUTROABS 8.9*  HGB 12.7*  HCT 41.1  MCV 95.4  PLT 412    Basic Metabolic Panel: Recent Labs  Lab 06/03/2021 1303  NA 133*  K 4.7  CL 89*  CO2 35*  GLUCOSE 161*  BUN 26*  CREATININE 0.78  CALCIUM 9.6   GFR: Estimated Creatinine Clearance: 90 mL/min (by C-G formula based on SCr of 0.78 mg/dL). Recent Labs  Lab 06/11/2021 1303  WBC 9.9    Liver Function Tests: Recent Labs  Lab 06/25/2021 1303  AST 32  ALT 24  ALKPHOS 125  BILITOT 0.4  PROT 6.8  ALBUMIN  2.1*   No results for input(s): LIPASE, AMYLASE in the last 168 hours. No results for input(s): AMMONIA in the last 168 hours.  ABG No results found for: PHART, PCO2ART, PO2ART, HCO3, TCO2, ACIDBASEDEF, O2SAT   Coagulation Profile: Recent Labs  Lab 06/24/2021 1303  INR 8.8*    Cardiac Enzymes: No results for input(s): CKTOTAL, CKMB, CKMBINDEX, TROPONINI in the last 168 hours.  HbA1C: No results found for: HGBA1C  CBG: No results for input(s): GLUCAP in the last 168 hours.  Review of Systems:   Unable to complete due to neuro status  Past Medical History:  He,  has a past medical history of Diabetes mellitus without complication ([place]), DVT (deep venous thrombosis) ([place]), Hypertension, MI (myocardial infarction) ([place]), and Pulmonary embolism ([place]).   Surgical History:   Past Surgical History:  Procedure Laterality Date   CHOLECYSTECTOMY     REPLACEMENT TOTAL KNEE BILATERAL     VENA CAVA FILTER PLACEMENT       Social History:   reports that he has quit smoking. He has never used smokeless tobacco. He reports that he does not drink alcohol and does not use drugs.   Family History:  His  family history is not on file.   Allergies No Known Allergies   Home Medications  Prior to Admission medications   Medication Sig Start Date End Date Taking? Authorizing Provider  allopurinol (ZYLOPRIM) 300 MG tablet Take 300 mg by mouth daily.    [provider]  aspirin 81 MG tablet Take 81 mg by mouth daily.    [provider]  carvedilol (COREG) 3.125 MG tablet Take 3.125 mg by mouth 2 (two) times daily with a meal.    [provider]  glipiZIDE (GLUCOTROL XL) 2.5 MG 24 hr tablet Take 2.5 mg by mouth daily.    [provider]  losartan (COZAAR) 25 MG tablet Take 25 mg by mouth daily.    [provider]  metFORMIN (GLUCOPHAGE) 1000 MG tablet Take 1,000 mg by mouth 2 (two) times daily with a meal.    [provider]  metolazone  (ZAROXOLYN) 2.5 MG tablet Take 2.5 mg by mouth daily.    [provider]  potassium chloride (KLOR-CON) 20 MEQ packet Take 20 mEq by mouth 2 (two) times daily.    [provider]  simvastatin (ZOCOR) 20 MG tablet Take 20 mg by mouth every evening.    [provider]  torsemide (DEMADEX) 10 MG tablet Take 10 mg by mouth daily.    [provider]  warfarin (COUMADIN) 5 MG tablet Take 5 mg by mouth daily.    [provider]     Critical care time: 40mins

## 2021-06-09 NOTE — Progress Notes (Signed)
On admission at [facility] patient was A&O x4. Checked patient at 2000 and he was waxing and waning with following commands and he would not talk. Pupils remained 4 round and brisk. Notified neurology and CCM.

## 2021-06-09 NOTE — ED Notes (Signed)
Pt provided urinal, no further needs expressed. Will continue to monitor.

## 2021-06-09 NOTE — ED Notes (Signed)
Desats upper 80s  Placed on 35% O2 venturi mask, SpO2 97

## 2021-06-09 NOTE — ED Notes (Signed)
Called to room 1 for RT assessment, SpO2 75% on r/a with good pleth, denies increased WOB, place on 4LPM humidified O2.  BBS decreases t/o R<L, scatter rhonchi.

## 2021-06-09 NOTE — ED Triage Notes (Signed)
Last night he lost his balance while using his walker. He fell backward landing on his buttocks. Bruising noted.

## 2021-06-09 NOTE — Consult Note (Signed)
  I was contacted by Dr. Duane Boston regarding this patient.  He is an 87 year old male with metastatic prostate cancer, history of pulmonary embolism, on Coumadin with an INR of 8.8 who took a fall.  He is hypoxic.  He was worked up with a head CT which demonstrated a small interhemispheric subdural hematoma and a chest CT which demonstrated multiple pulmonary issues including pulmonary embolisms.  Under the circumstances, my opinion is that reversing his Coumadin would present more risk than benefits as interhemispheric subdural hematomas rarely become clinically symptomatic and he is quite symptomatic from his pulmonary disease/pulmonary embolisms.  I do not think a follow-up head CT is necessary unless his clinical status should decline.  Please call if I can be of further assistance.

## 2021-06-09 NOTE — Progress Notes (Addendum)
[facility] Progress Note Patient Name: Adrian Foster DOB: February 12, 1934 MRN: [id number]   Date of Service  06/24/2021  HPI/Events of Note  Pt transferred from outside facility with an intracranial bleed, Pe's, and a coagulopathy  eICU Interventions  MAP stable; SpO2 stable; resting comfortably.  Spoke to RN; pt was awake/laughing earlier; now obtunded  Stat head ct ordered; in house team to assess     Intervention Category Evaluation Type: New Patient Evaluation  Tilden Dome 06/28/2021, 8:08 PM

## 2021-06-09 NOTE — ED Notes (Signed)
Pt and pts spouse updated as to plan for transfer and expected time for transfer. All questions addressed at this time. Will continue to monitor.

## 2021-06-09 NOTE — ED Notes (Signed)
[facility] at bedside 

## 2021-06-09 NOTE — Consult Note (Addendum)
Neurology Consultation Reason for Consult: Altered mental status Referring Physician: Earlie Server, C  CC: Altered mental status  History is obtained from: Patient, wife, chart review  HPI: Adrian Foster is a 87 y.o. male with a history of diabetes, DVT with IVC filter in place who presented with shortness of breath and was found to have multiple pulmonary emboli.  He had a CT of his head performed which showed a small subdural hematoma given recent fall, but he was relatively normal neurologically per his wife even while coming up to the ICU.  After arrival in the ICU, he had a decreased level of responsiveness, stopped following commands briefly.  His blood pressure was apparently stable at the time.  There is some question about whether his sats dropped during the episode.  The patient is amnestic to this episode, stating that he does not remember having any difficulty speaking or interacting.  He currently has greatly improved, now back to baseline essentially.  He did have a fall yesterday where he hit his head.  His INR was found to be 8.8.  ROS: A 14 point ROS was performed and is negative except as noted in the HPI.   Past Medical History:  Diagnosis Date   Diabetes mellitus without complication ([place])    DVT (deep venous thrombosis) (H[facility])    Hypertension    MI (myocardial infarction) ([place])    Pulmonary embolism ([place])      Family history: No history of seizures   Social History:  reports that he has quit smoking. He has never used smokeless tobacco. He reports that he does not drink alcohol and does not use drugs.   Exam: Current vital signs: BP 105/60   Pulse 87   Temp (!) 97.5 F (36.4 C) (Oral)   Resp (!) 8   Ht 6\' 3"  (1.905 m)   Wt 108.9 kg   SpO2 92%   BMI 30.00 kg/m  Vital signs in last 24 hours: Temp:  [97.5 F (36.4 C)-98.2 F (36.8 C)] 97.5 F (36.4 C) (12/08 1921) Pulse Rate:  [82-113] 87 (12/08 2000) Resp:  [0-42] 8 (12/08 2000) BP: (93-116)/(60-78)  105/60 (12/08 2000) SpO2:  [75 %-99 %] 92 % (12/08 2000) FiO2 (%):  [35 %-40 %] 40 % (12/08 1921) Weight:  [108.9 kg] 108.9 kg (12/08 1241)   Physical Exam  Constitutional: Appears well-developed and well-nourished.  Psych: Affect appropriate to situation Eyes: No scleral injection HENT: No OP obstruction MSK: no joint deformities.  Cardiovascular: Normal rate and regular rhythm.  Respiratory: Effort normal, non-labored breathing GI: Soft.  No distension. There is no tenderness.  Skin: WDI  Neuro: Mental Status: Patient is awake, alert, oriented to person, place, but he does give the month is November Patient is able to give a clear and coherent history. No signs of aphasia or neglect Cranial Nerves: II: Visual Fields are full. Pupils are equal, round, and reactive to light.   III,IV, VI: EOMI without ptosis or diploplia.  V: Facial sensation is symmetric to temperature VII: Facial movement is symmetric.  VIII: hearing is intact to voice X: Uvula elevates symmetrically XI: Shoulder shrug is symmetric. XII: tongue is midline without atrophy or fasciculations.  Motor: Tone is normal. Bulk is normal. 5/5 strength was present in all four extremities.  Sensory: Sensation is symmetric to light touch and temperature in the arms and legs. Cerebellar: No clear ataxia    I have reviewed labs in epic and the results pertinent to this consultation are:  Sodium 133 BUN 26 CBC-unremarkable   I have reviewed the images obtained: CT head-small parafalcine subdural hematoma INR 8.8  Impression: 87 year old male with transient episode of decreased responsiveness in the setting of a parafalcine subdural hematoma.  Given that he is amnestic to the episode, I think that TIA is relatively unlikely.  Possibilities include complex partial seizure versus pulmonary related hypo-oxygenation.  Given that his sats did not significantly drop during the episode, I think that starting antiepileptics  would be prudent.  Neurosurgery was contacted regarding his subdural hematoma, and did not feel that reversal was necessary given that he is quite symptomatic from his pulmonary disease.  Recommendations: 1) MRI brain 2) routine EEG 3) Keppra 1 g x 1 followed by 500 mg twice daily 4) neurology will continue to follow   Roland Rack, MD [facility] [phone]  If 7pm- 7am, please page neurology on call as listed in [place].

## 2021-06-10 ENCOUNTER — Inpatient Hospital Stay (HOSPITAL_COMMUNITY): Payer: Medicare Other

## 2021-06-10 DIAGNOSIS — J189 Pneumonia, unspecified organism: Secondary | ICD-10-CM

## 2021-06-10 DIAGNOSIS — R569 Unspecified convulsions: Secondary | ICD-10-CM

## 2021-06-10 DIAGNOSIS — S065XAA Traumatic subdural hemorrhage with loss of consciousness status unknown, initial encounter: Principal | ICD-10-CM

## 2021-06-10 DIAGNOSIS — C61 Malignant neoplasm of prostate: Secondary | ICD-10-CM

## 2021-06-10 DIAGNOSIS — J9601 Acute respiratory failure with hypoxia: Secondary | ICD-10-CM

## 2021-06-10 DIAGNOSIS — E43 Unspecified severe protein-calorie malnutrition: Secondary | ICD-10-CM | POA: Insufficient documentation

## 2021-06-10 DIAGNOSIS — C7801 Secondary malignant neoplasm of right lung: Secondary | ICD-10-CM

## 2021-06-10 DIAGNOSIS — R404 Transient alteration of awareness: Secondary | ICD-10-CM

## 2021-06-10 DIAGNOSIS — J9602 Acute respiratory failure with hypercapnia: Secondary | ICD-10-CM

## 2021-06-10 DIAGNOSIS — G9341 Metabolic encephalopathy: Secondary | ICD-10-CM

## 2021-06-10 LAB — PROTIME-INR
INR: 8.8 (ref 0.8–1.2)
INR: 1.5 — ABNORMAL HIGH (ref 0.8–1.2)
Prothrombin Time: 72.4 seconds — ABNORMAL HIGH (ref 11.4–15.2)
Prothrombin Time: 18 seconds — ABNORMAL HIGH (ref 11.4–15.2)

## 2021-06-10 LAB — CBC
HCT: 43.8 % (ref 39.0–52.0)
Hemoglobin: 12.7 g/dL — ABNORMAL LOW (ref 13.0–17.0)
MCH: 29.1 pg (ref 26.0–34.0)
MCHC: 29 g/dL — ABNORMAL LOW (ref 30.0–36.0)
MCV: 100.2 fL — ABNORMAL HIGH (ref 80.0–100.0)
Platelets: 279 10*3/uL (ref 150–400)
RBC: 4.37 MIL/uL (ref 4.22–5.81)
RDW: 16.7 % — ABNORMAL HIGH (ref 11.5–15.5)
WBC: 6.9 10*3/uL (ref 4.0–10.5)
nRBC: 0.6 % — ABNORMAL HIGH (ref 0.0–0.2)

## 2021-06-10 LAB — BASIC METABOLIC PANEL
Anion gap: 7 (ref 5–15)
BUN: 20 mg/dL (ref 8–23)
CO2: 37 mmol/L — ABNORMAL HIGH (ref 22–32)
Calcium: 9.8 mg/dL (ref 8.9–10.3)
Chloride: 91 mmol/L — ABNORMAL LOW (ref 98–111)
Creatinine, Ser: 0.78 mg/dL (ref 0.61–1.24)
GFR, Estimated: >60 mL/min (ref >60)
Glucose, Bld: 120 mg/dL — ABNORMAL HIGH (ref 70–99)
Potassium: 4.4 mmol/L (ref 3.5–5.1)
Sodium: 135 mmol/L (ref 135–145)

## 2021-06-10 LAB — POCT I-STAT 7, (LYTES, BLD GAS, ICA,H+H)
Acid-Base Excess: 16 mmol/L — ABNORMAL HIGH (ref 0.0–2.0)
Bicarbonate: 41.2 mmol/L — ABNORMAL HIGH (ref 20.0–28.0)
Calcium, Ion: 1.32 mmol/L (ref 1.15–1.40)
HCT: 39 % (ref 39.0–52.0)
Hemoglobin: 13.3 g/dL (ref 13.0–17.0)
O2 Saturation: 100 %
Patient temperature: 97.4
Potassium: 4.1 mmol/L (ref 3.5–5.1)
Sodium: 134 mmol/L — ABNORMAL LOW (ref 135–145)
TCO2: 43 mmol/L — ABNORMAL HIGH (ref 22–32)
pCO2 arterial: 50.8 mmHg — ABNORMAL HIGH (ref 32.0–48.0)
pH, Arterial: 7.515 — ABNORMAL HIGH (ref 7.350–7.450)
pO2, Arterial: 414 mmHg — ABNORMAL HIGH (ref 83.0–108.0)

## 2021-06-10 LAB — GLUCOSE, CAPILLARY
Glucose-Capillary: 93 mg/dL (ref 70–99)
Glucose-Capillary: 114 mg/dL — ABNORMAL HIGH (ref 70–99)
Glucose-Capillary: 93 mg/dL (ref 70–99)
Glucose-Capillary: 82 mg/dL (ref 70–99)
Glucose-Capillary: 82 mg/dL (ref 70–99)

## 2021-06-10 LAB — HEPARIN LEVEL (UNFRACTIONATED): Heparin Unfractionated: 0.14 IU/mL — ABNORMAL LOW (ref 0.30–0.70)

## 2021-06-10 LAB — PHOSPHORUS: Phosphorus: 5.4 mg/dL — ABNORMAL HIGH (ref 2.5–4.6)

## 2021-06-10 LAB — MAGNESIUM: Magnesium: 1.6 mg/dL — ABNORMAL LOW (ref 1.7–2.4)

## 2021-06-10 IMAGING — CT CT ANGIO HEAD-NECK (W OR W/O PERF)
2 of 7 series · 8 of 33 positions shown · IV contrast (APPLIED)
Comparison: Prior CT from [DATE].

CLINICAL DATA: Follow-up examination for stroke.

EXAM:
CT ANGIOGRAPHY HEAD AND NECK
TECHNIQUE: Multidetector CT imaging of the head and neck was performed using
the standard protocol during bolus administration of intravenous
contrast. Multiplanar CT image reconstructions and MIPs were
obtained to evaluate the vascular anatomy. Carotid stenosis
measurements (when applicable) are obtained utilizing NASCET
criteria, using the distal internal carotid diameter as the
denominator.
CONTRAST:  150mL OMNIPAQUE IOHEXOL 350 MG/ML SOLN

[Series 8: cta neck · axial · 0.62mm/px · z∈[-261,-125]mm · 2 of 206 slices shown]
[im 69/206  soft-tissue]
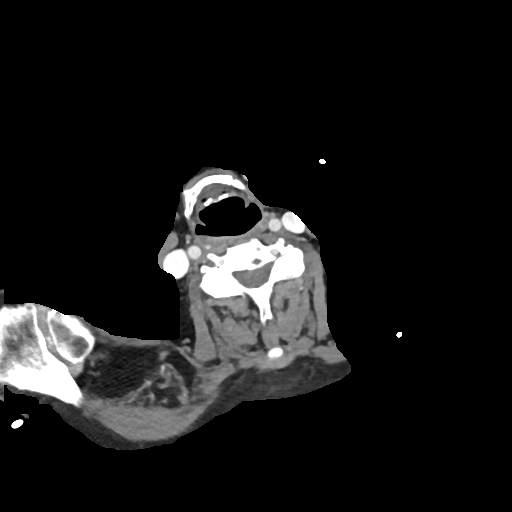
[im 137/206  soft-tissue]
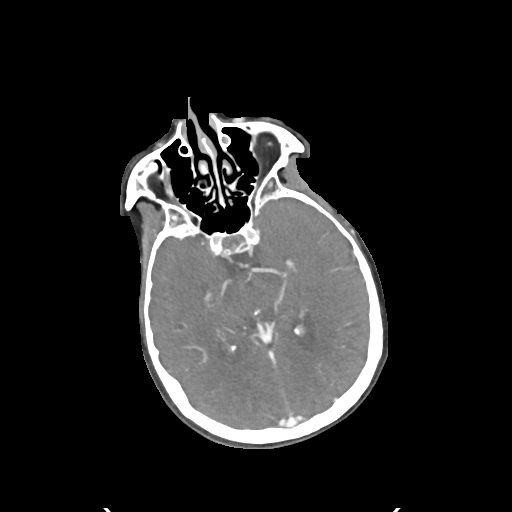

[Series 10: cta neck axial · axial · 0.39mm/px · z∈[-334,-61]mm · 6 of 384 slices shown]
[im 55/384  soft-tissue]
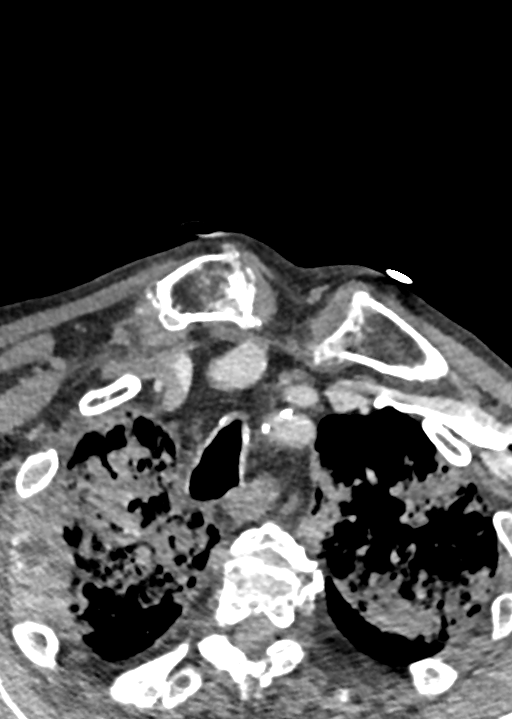
[im 110/384  bone]
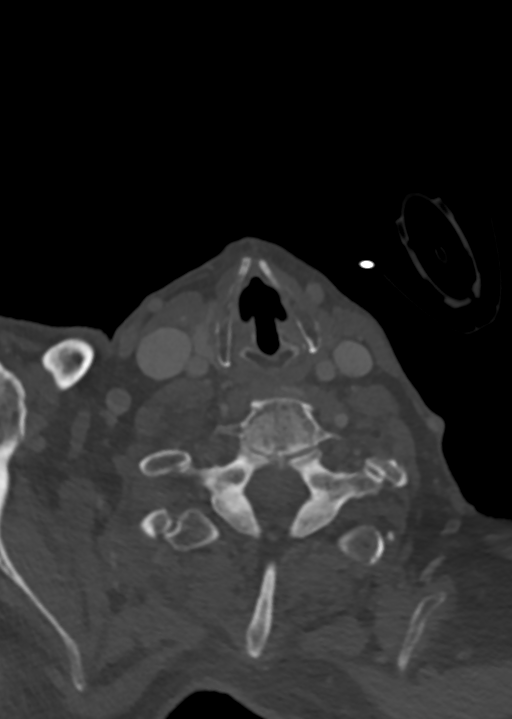
[im 165/384  soft-tissue]
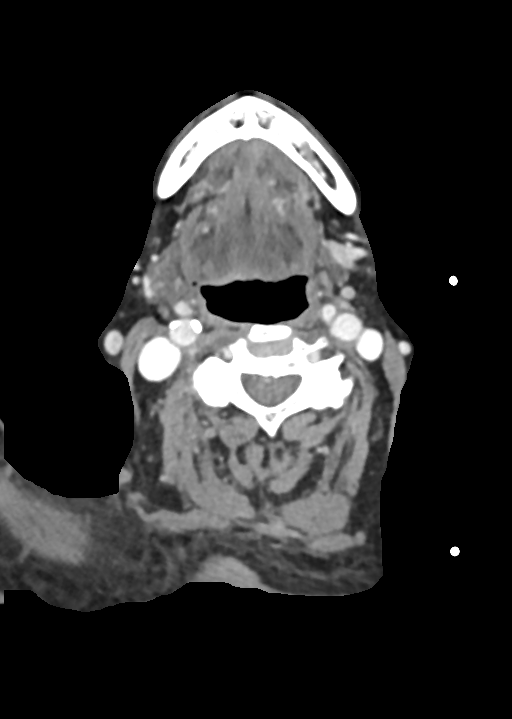
[im 219/384  bone]
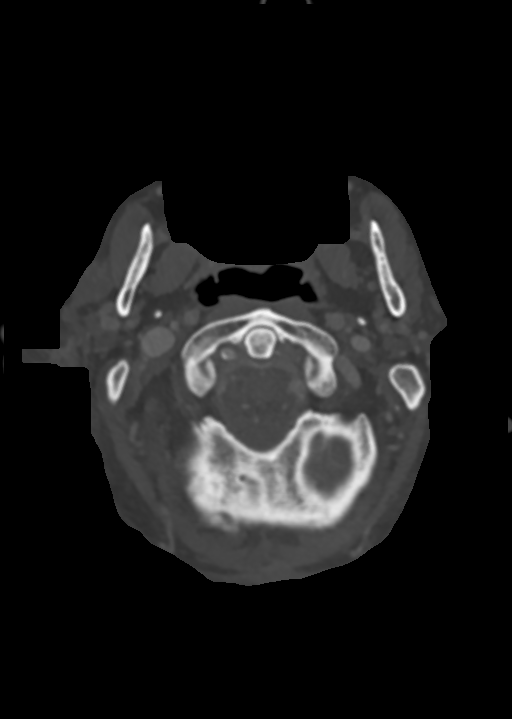
[im 274/384  soft-tissue]
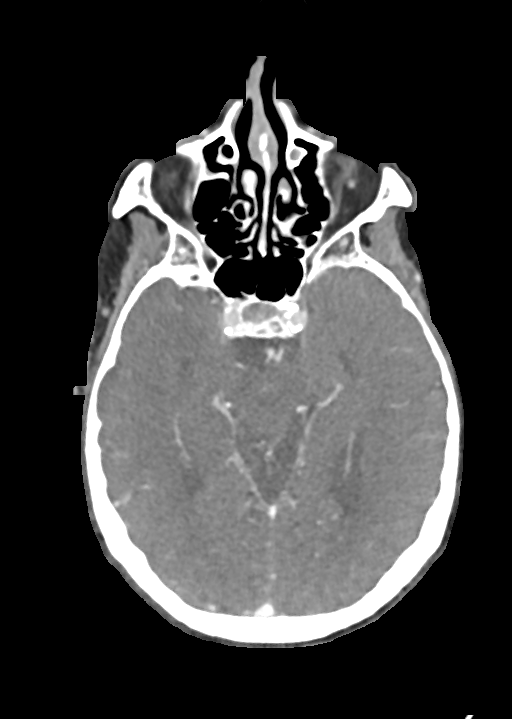
[im 329/384  bone]
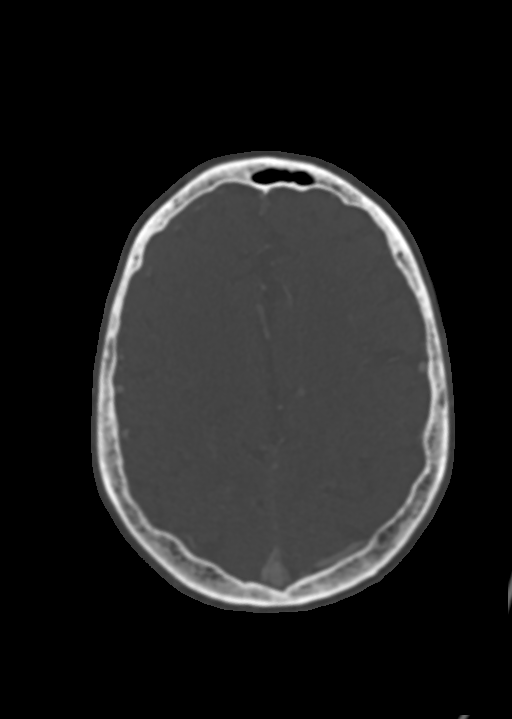

[8 of 33 positions shown; findings below may reference images not displayed]

FINDINGS: CTA NECK FINDINGS

Aortic arch: Visualized aortic arch normal in caliber with normal 3
vessel morphology. Moderate atheromatous change about the arch and
origin the great vessels without hemodynamically significant
stenosis.

Right carotid system: Right CCA patent from its origin to the
bifurcation without stenosis. Scattered calcified plaque about the
right carotid bulb/proximal right ICA with associated stenosis of up
to 40-50% by NASCET criteria. Right ICA patent distally to the skull
base without stenosis or dissection.

Left carotid system: Left CCA patent from its origin to the
bifurcation without stenosis. Bulky calcified plaque about the left
carotid bulb/proximal left ICA. There is a short-segment severe 90%
stenosis involving the proximal left ICA (series 10, image 205).
Left ICA patent distally without stenosis or dissection.

Vertebral arteries: Both vertebral arteries arise from the
subclavian arteries. No significant proximal subclavian artery
stenosis. Left vertebral artery dominant. Atheromatous change at the
origins of both vertebral arteries with associated severe ostial
stenoses. Vertebral arteries otherwise patent within the neck
without stenosis or dissection.

Skeleton: Few scattered lucencies within the cervical spine felt to
be degenerative in nature. No definite discrete lytic or blastic
osseous lesions. Underlying mild for age spondylosis. Patient is
largely edentulous.

Other neck: No other acute soft tissue abnormality within the neck.
No mass or adenopathy.

Upper chest: Extensive multifocal bilateral airspace disease again
seen, concerning for multifocal infection/pneumonia. Known right
apical tumor invading the right second and third ribs again noted,
stable.

Review of the MIP images confirms the above findings

CTA HEAD FINDINGS

Anterior circulation: Petrous segments patent bilaterally.
Atheromatous plaque throughout the carotid siphons with associated
mild diffuse narrowing. A1 segments patent bilaterally. Normal
anterior communicating artery complex. Anterior cerebral arteries
patent to their distal aspects without significant stenosis. No M1
stenosis or occlusion. Normal MCA bifurcations. Distal MCA branches
perfused and symmetric.

Posterior circulation: Left vertebral artery dominant and widely
patent to the vertebrobasilar junction. Left PICA patent.
Hypoplastic right vertebral artery terminates in PICA. Right PICA
patent as well. Basilar mildly tortuous but is widely patent to its
distal aspect. Superior cerebral arteries patent bilaterally. Fetal
type origin of the left PCA. Right PCA supplied via the basilar as
well as a robust right posterior communicating artery. Both PCAs
well perfused to their distal aspects without stenosis.

Venous sinuses: Patent.

Anatomic variants: Fetal type origin of the right PCA. No aneurysm.
Previously identified small 5 mm posterior parafalcine subdural
hematoma again noted, grossly stable.

Review of the MIP images confirms the above findings
IMPRESSION: 1. Negative CTA for large vessel occlusion.
2. Bulky calcified plaque about the proximal left ICA with
associated short-segment severe 90% stenosis.
3. Atheromatous plaque about the right carotid bulb/proximal right
ICA with associated stenosis of up to 40-50% by NASCET criteria.
4. Severe ostial stenoses about the origins of both vertebral
arteries. Left vertebral artery dominant. Right vertebral artery
terminates in PICA.
5. Extensive multifocal bilateral airspace disease, consistent with
multifocal infection/pneumonia. Known right apical mass with rib
invasion, stable.
6. No significant interval change in small 5 mm posterior
parafalcine subdural hematoma.

## 2021-06-10 IMAGING — MR MR MRA HEAD W/O CM
1 series · 18 of 48 positions shown · non-contrast
Comparison: CT head [DATE]. CTA head and neck [RG] hours today.

[REDACTED] Brain MRI [DATE].

CLINICAL DATA: 87-year-old male with metastatic lung cancer (right
Pancoast tumor). Fall on Coumadin with supratherapeutic INR (8.8).
But CTA suspicious for pulmonary emboli. Bilateral pneumonia. 5 mm
para falcine subdural hematoma. Neurologic deficit.



[Series 5: 3d cow · axial · 0.5mm · 0.41mm/px · z∈[-27,+48]mm · 18 of 172 slices shown]
[im 1/172]
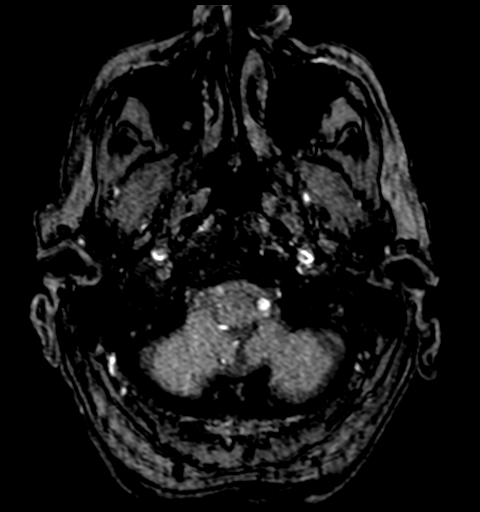
[im 4/172]
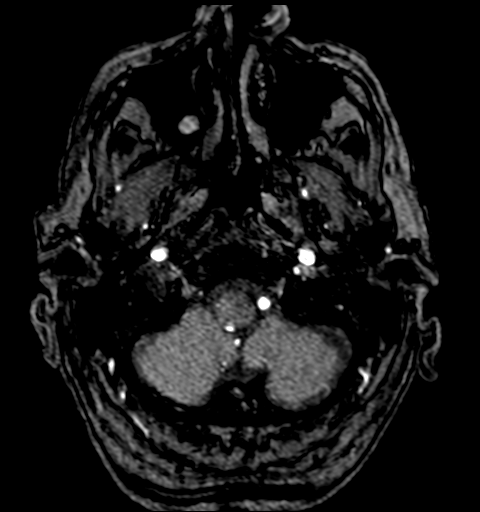
[im 8/172]
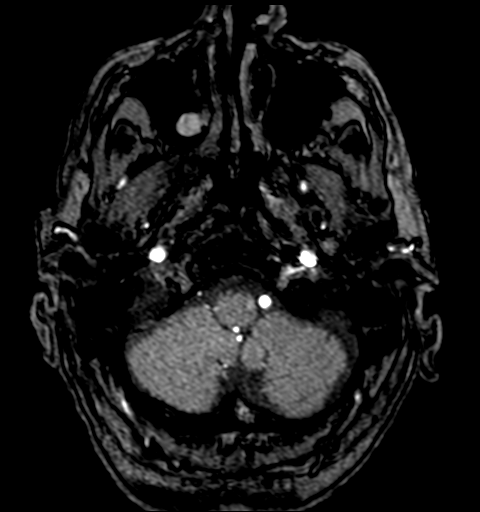
[im 11/172]
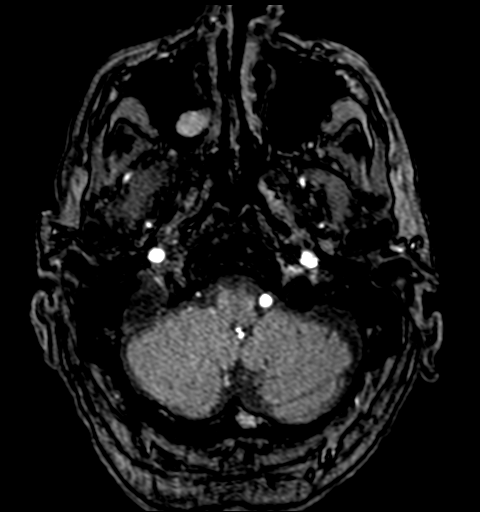
[im 15/172]
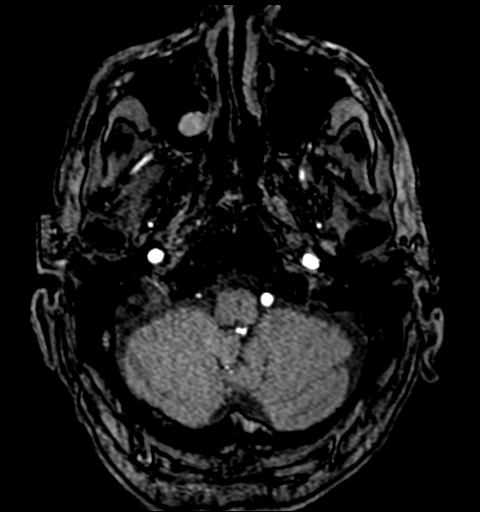
[im 19/172]
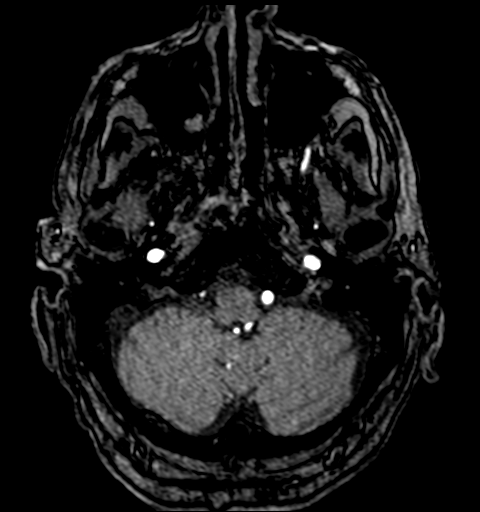
[im 22/172]
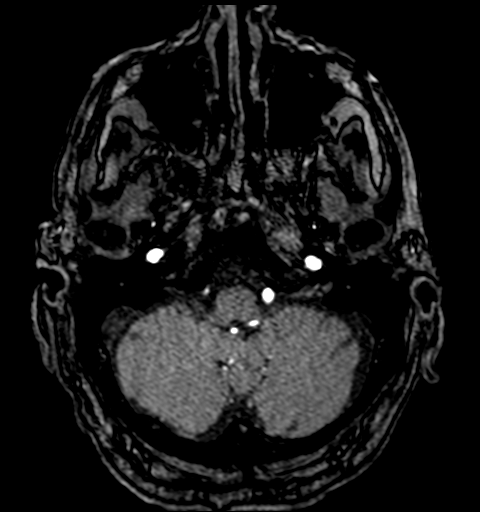
[im 26/172]
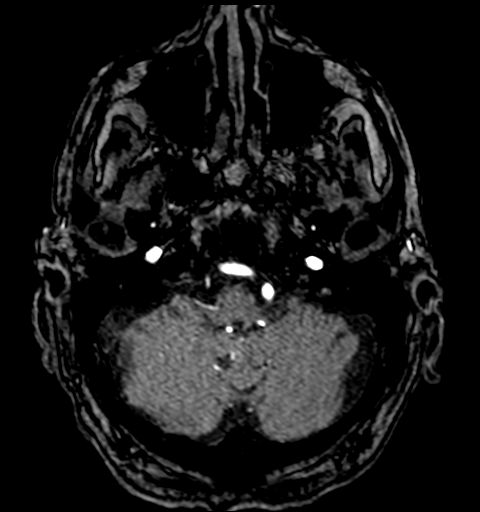
[im 30/172]
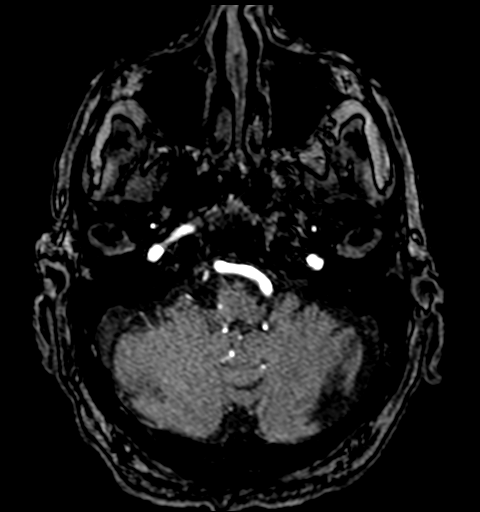
[im 33/172]
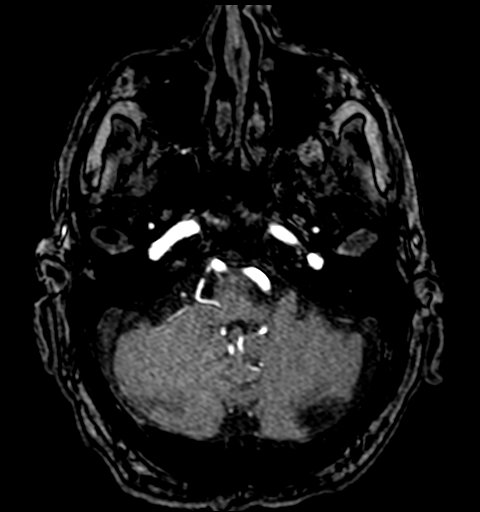
[im 55/172]
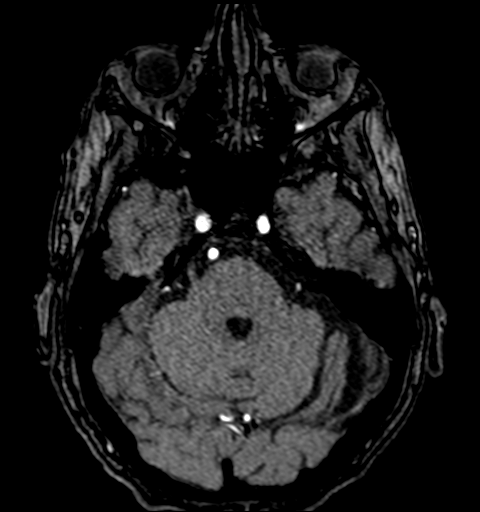
[im 77/172]
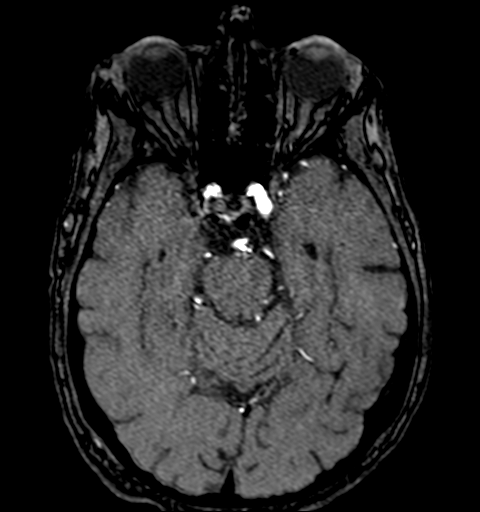
[im 88/172]
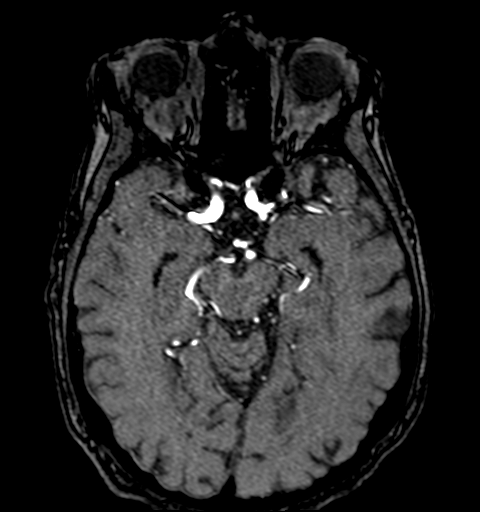
[im 99/172]
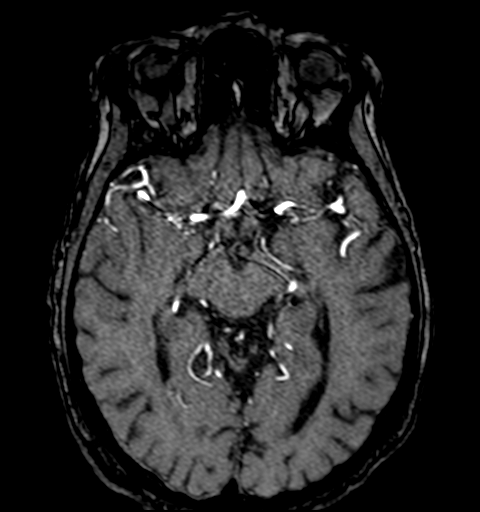
[im 121/172]
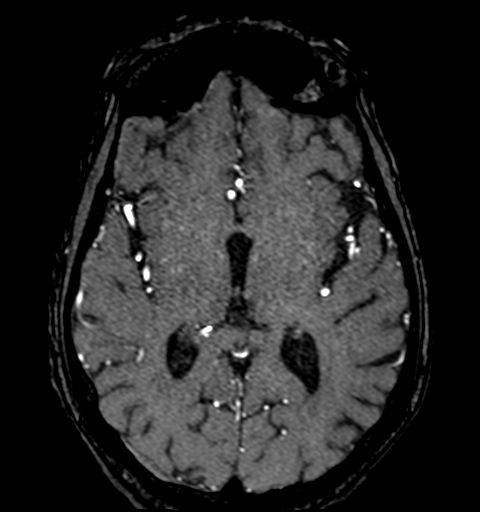
[im 142/172]
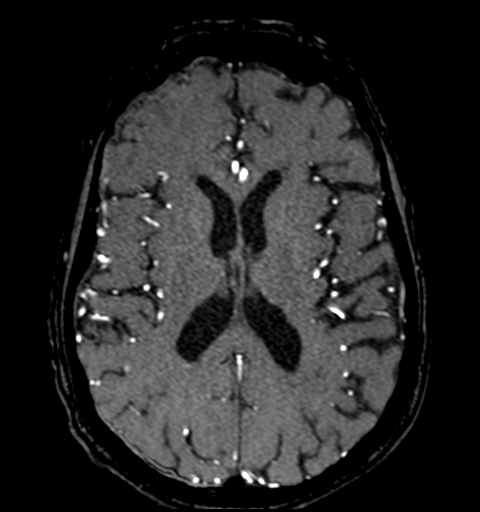
[im 146/172]
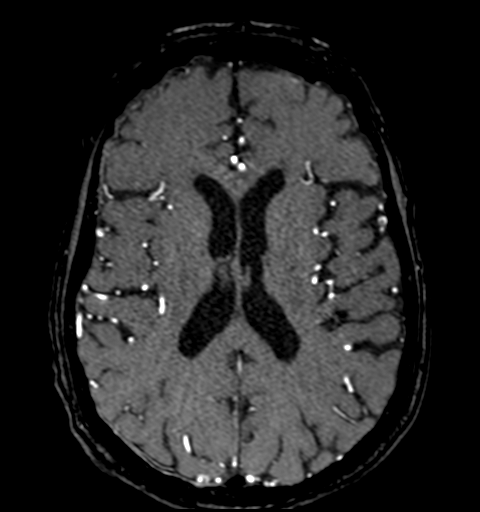
[im 164/172]
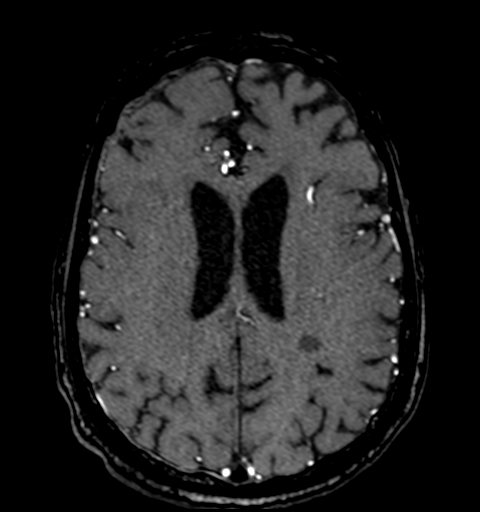

[18 of 48 positions shown; findings below may reference images not displayed]

FINDINGS: MRI HEAD FINDINGS

Brain: 3-4 mm right side subdural hematoma with similar sized para
falcine component (series 11, image 18). No midline shift. No
significant intracranial mass effect. No subarachnoid or
intraventricular blood identified. No ventriculomegaly.

In the left posterior corona radiata white matter there is a ring
like area of abnormal trace diffusion (series 5, image 94) which
appears facilitated on ADC and stable from [facility] MRI without
enhancement at that time compatible with chronic white matter
lacunar infarct.

Punctate restricted diffusion in the right periatrial white matter
on series 5, image 90 which was also vaguely visible in [REDACTED] and
without enhancement.

No restricted diffusion suggestive of acute infarction. No contrast
administered today.

A few scattered chronic microhemorrhages in the brain are more
apparent on SWI today versus T2 * imaging in [REDACTED]. Patchy and
confluent other cerebral white matter T2 and FLAIR hyperintensity is
stable since [REDACTED]. No areas specific for vasogenic edema. No
ventriculomegaly or intracranial mass effect. Small chronic left
cerebellar infarcts are stable. Stable deep gray matter nuclei and
brainstem.

Cervicomedullary junction and pituitary are within normal limits.

Vascular: Major intracranial vascular flow voids are stable since
[REDACTED]. Dominant left vertebral artery.

Skull and upper cervical spine: Negative visible cervical spine.
Visualized bone marrow signal is within normal limits.

Sinuses/Orbits: Stable and negative; small maxillary mucous
retention cysts.

Other: Mastoids remain clear. Visible internal auditory structures
appear normal.

MRA NECK FINDINGS

Time-of-flight neck MRA images reveal antegrade flow in both
cervical carotid and vertebral arteries in the neck and to the skull
base. 3 vessel arch configuration visible on series 14. Left
vertebral artery appears dominant throughout. No evidence of
vertebral artery stenosis.

Irregularity at both carotid bifurcations corresponding to
atherosclerosis demonstrated by CTA earlier today. No evidence of
hemodynamically significant stenosis on the right. However, distal
to the left bulb high-grade left ICA stenosis redemonstrated (series
11, image 82), numerically estimated at 90% by CTA today. The vessel
remains patent.

And 4 vessel antegrade flow continues to the skull base.

MRA HEAD FINDINGS

Antegrade flow in the posterior circulation with dominant left
vertebral artery which supplies the basilar. Distal right vertebral
functionally terminates in PICA as seen by CTA. No distal vertebral
or basilar stenosis. Patent AICA and SCA origins. Fetal type
bilateral PCA origins with tortuous posterior communicating
arteries. Bilateral PCA branches are within normal limits.

Antegrade flow in both ICA siphons. Ectatic appearing siphons with
irregularity in keeping with atherosclerosis by CTA. But no
significant siphon stenosis. Normal posterior communicating artery
origins. Patent carotid termini. Normal MCA and ACA origins. Normal
ophthalmic artery origins. Diminutive or absent anterior
communicating artery. Mildly tortuous ACA branches are within normal
limits. MCA M1 segments, bi/trifurcations and visible left MCA
branches are within normal limits.
IMPRESSION: 1. No acute intracranial abnormality. And no strong evidence of
metastatic disease in the absence of IV contrast.

2. Stable appearance of cerebral small vessel disease since a [DATE]. Neck MRA re-demonstrates high-grade stenosis of the cervical Left
ICA at the distal bulb as seen on CTA earlier today.

4. No other hemodynamically significant stenosis by MRA head and
neck.

## 2021-06-10 IMAGING — MR MR MRA NECK W/O CM
4 of 9 series · 21 of 48 positions shown · non-contrast
Comparison: CT head [DATE]. CTA head and neck [RG] hours today.

[REDACTED] Brain MRI [DATE].

CLINICAL DATA: 87-year-old male with metastatic lung cancer (right
Pancoast tumor). Fall on Coumadin with supratherapeutic INR (8.8).
But CTA suspicious for pulmonary emboli. Bilateral pneumonia. 5 mm
para falcine subdural hematoma. Neurologic deficit.



[Series 11: tof_fl3d_tra_iso · axial · 0.6mm · 0.52mm/px · z∈[-151,-75]mm · 9 of 146 slices shown]
[im 8/146]
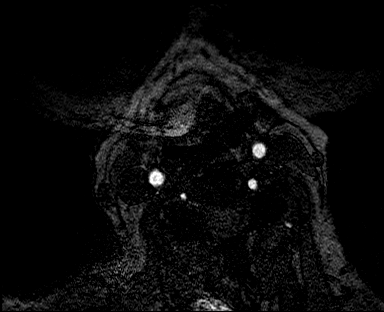
[im 23/146]
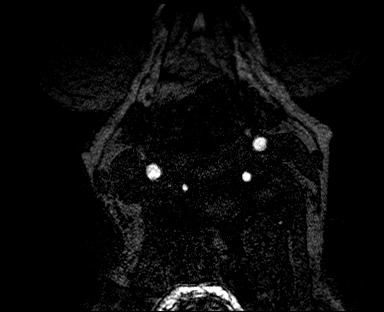
[im 46/146]
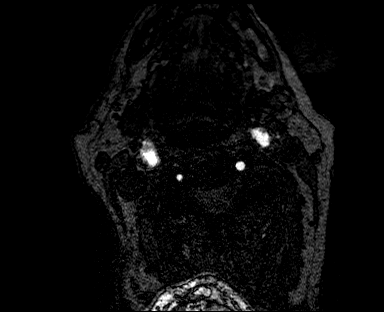
[im 62/146]
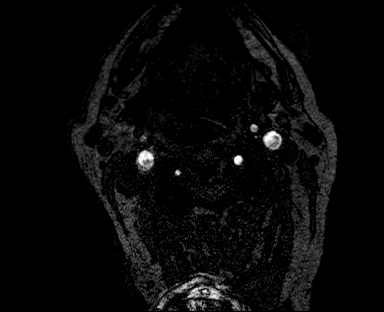
[im 77/146]
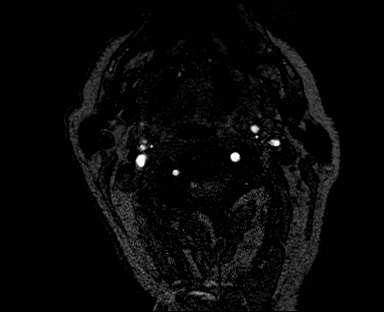
[im 84/146]
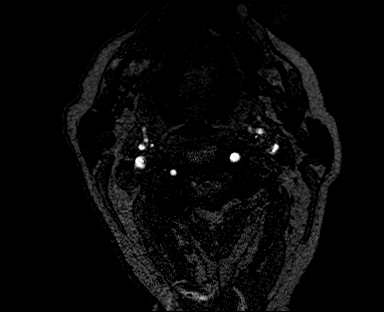
[im 100/146]
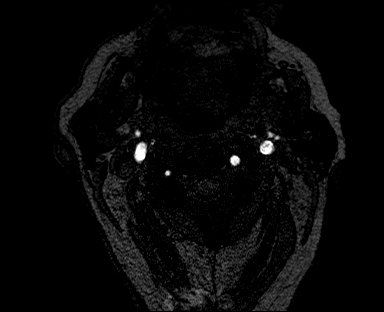
[im 123/146]
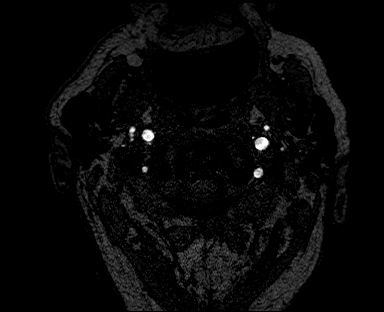
[im 138/146]
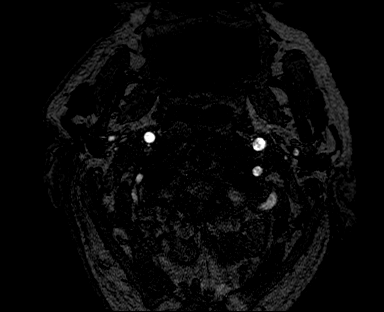

[Series 14: tof_fl2d_tra · axial · 3.0mm · 0.86mm/px · z∈[-254,-29]mm · 10 of 130 slices shown]
[im 8/130]
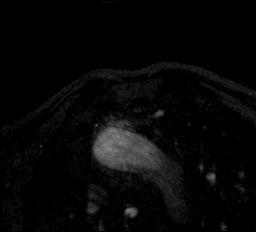
[im 23/130]
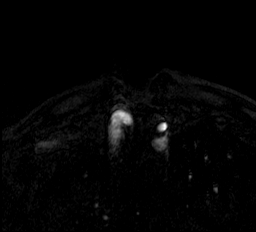
[im 38/130]
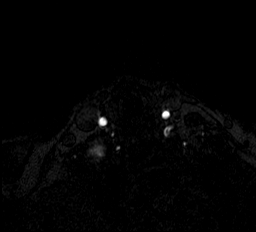
[im 54/130]
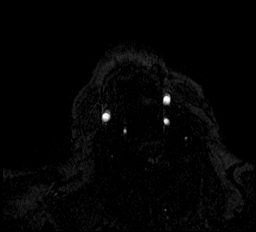
[im 69/130]
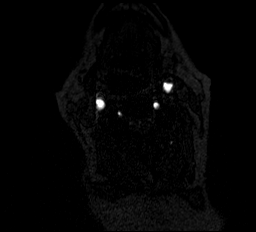
[im 76/130]
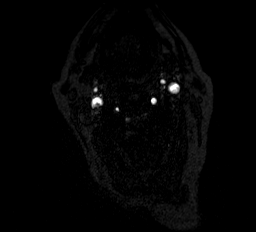
[im 92/130]
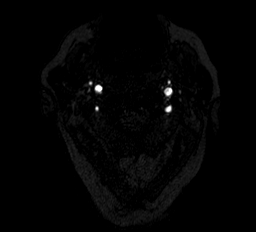
[im 107/130]
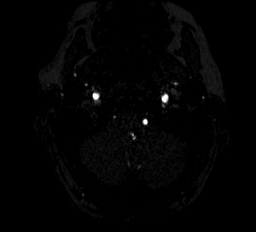
[im 114/130]
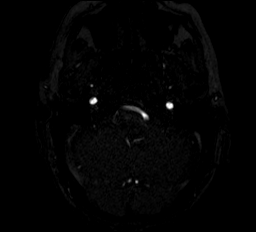
[im 122/130]
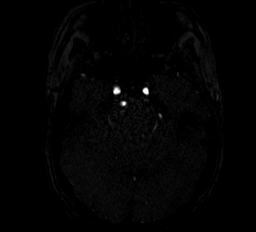

[Series 1015: rt tumble · axial · 0.9mm · 0.23mm/px · 1 of 5 slices shown]
[im 1/5]
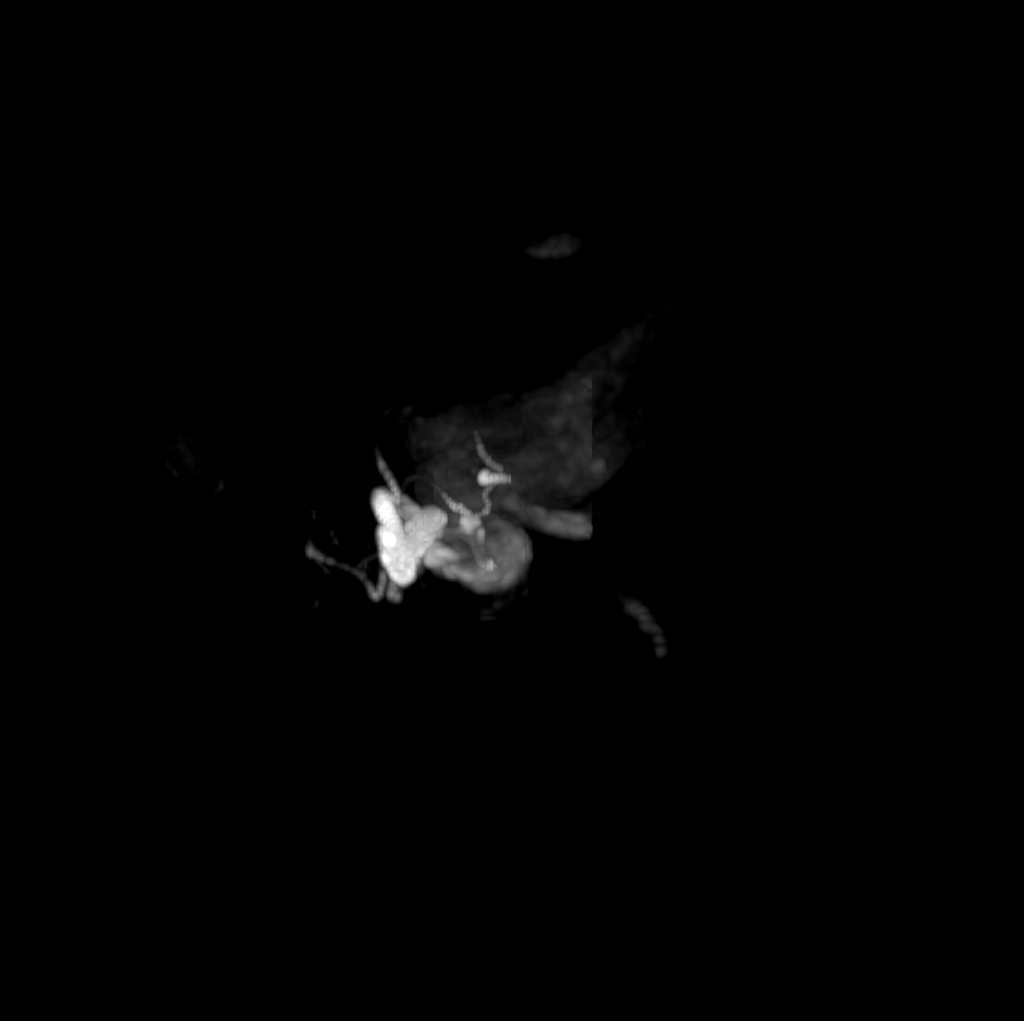

[Series 1025: lt tumble · axial · 0.9mm · 0.23mm/px · 1 of 2 slices shown]
[im 1/2]
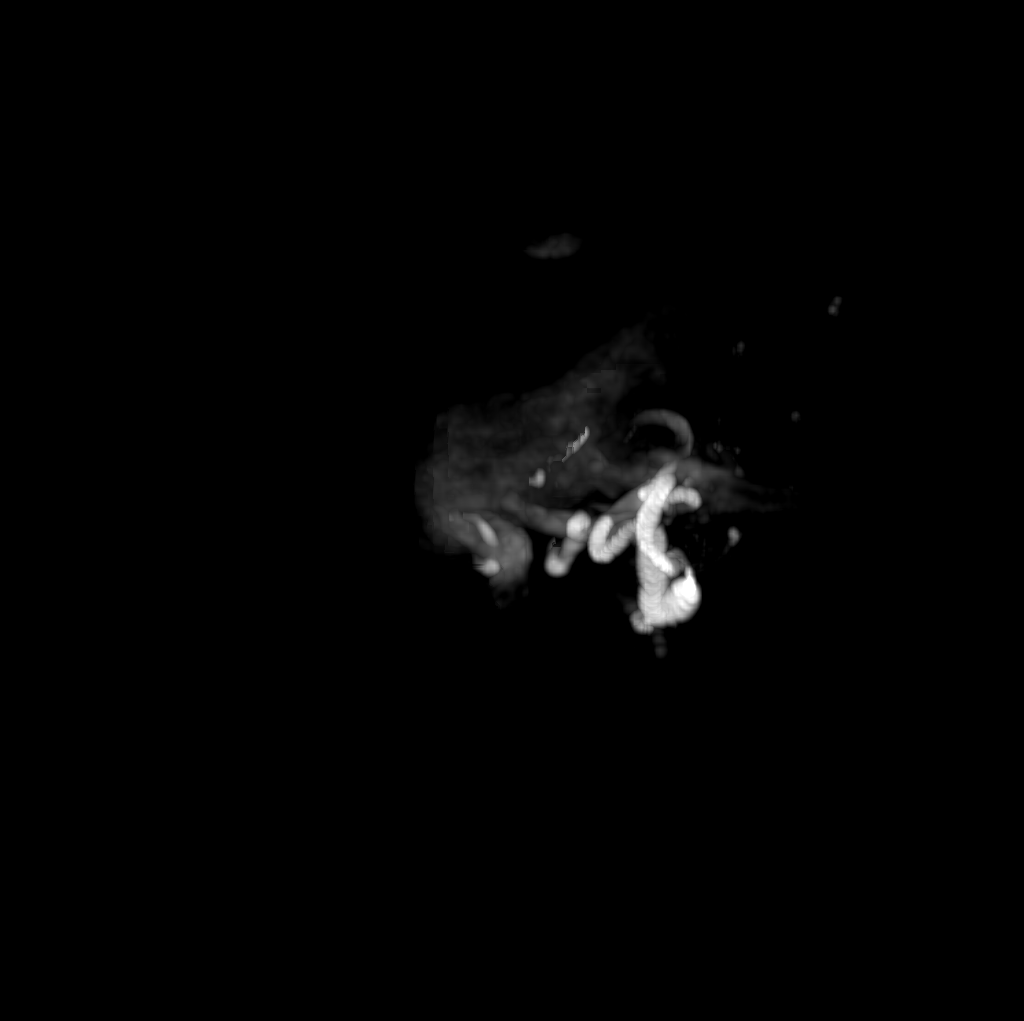

[21 of 48 positions shown; findings below may reference images not displayed]

FINDINGS: MRI HEAD FINDINGS

Brain: 3-4 mm right side subdural hematoma with similar sized para
falcine component (series 11, image 18). No midline shift. No
significant intracranial mass effect. No subarachnoid or
intraventricular blood identified. No ventriculomegaly.

In the left posterior corona radiata white matter there is a ring
like area of abnormal trace diffusion (series 5, image 94) which
appears facilitated on ADC and stable from [facility] MRI without
enhancement at that time compatible with chronic white matter
lacunar infarct.

Punctate restricted diffusion in the right periatrial white matter
on series 5, image 90 which was also vaguely visible in [REDACTED] and
without enhancement.

No restricted diffusion suggestive of acute infarction. No contrast
administered today.

A few scattered chronic microhemorrhages in the brain are more
apparent on SWI today versus T2 * imaging in [REDACTED]. Patchy and
confluent other cerebral white matter T2 and FLAIR hyperintensity is
stable since [REDACTED]. No areas specific for vasogenic edema. No
ventriculomegaly or intracranial mass effect. Small chronic left
cerebellar infarcts are stable. Stable deep gray matter nuclei and
brainstem.

Cervicomedullary junction and pituitary are within normal limits.

Vascular: Major intracranial vascular flow voids are stable since
[REDACTED]. Dominant left vertebral artery.

Skull and upper cervical spine: Negative visible cervical spine.
Visualized bone marrow signal is within normal limits.

Sinuses/Orbits: Stable and negative; small maxillary mucous
retention cysts.

Other: Mastoids remain clear. Visible internal auditory structures
appear normal.

MRA NECK FINDINGS

Time-of-flight neck MRA images reveal antegrade flow in both
cervical carotid and vertebral arteries in the neck and to the skull
base. 3 vessel arch configuration visible on series 14. Left
vertebral artery appears dominant throughout. No evidence of
vertebral artery stenosis.

Irregularity at both carotid bifurcations corresponding to
atherosclerosis demonstrated by CTA earlier today. No evidence of
hemodynamically significant stenosis on the right. However, distal
to the left bulb high-grade left ICA stenosis redemonstrated (series
11, image 82), numerically estimated at 90% by CTA today. The vessel
remains patent.

And 4 vessel antegrade flow continues to the skull base.

MRA HEAD FINDINGS

Antegrade flow in the posterior circulation with dominant left
vertebral artery which supplies the basilar. Distal right vertebral
functionally terminates in PICA as seen by CTA. No distal vertebral
or basilar stenosis. Patent AICA and SCA origins. Fetal type
bilateral PCA origins with tortuous posterior communicating
arteries. Bilateral PCA branches are within normal limits.

Antegrade flow in both ICA siphons. Ectatic appearing siphons with
irregularity in keeping with atherosclerosis by CTA. But no
significant siphon stenosis. Normal posterior communicating artery
origins. Patent carotid termini. Normal MCA and ACA origins. Normal
ophthalmic artery origins. Diminutive or absent anterior
communicating artery. Mildly tortuous ACA branches are within normal
limits. MCA M1 segments, bi/trifurcations and visible left MCA
branches are within normal limits.
IMPRESSION: 1. No acute intracranial abnormality. And no strong evidence of
metastatic disease in the absence of IV contrast.

2. Stable appearance of cerebral small vessel disease since a [DATE]. Neck MRA re-demonstrates high-grade stenosis of the cervical Left
ICA at the distal bulb as seen on CTA earlier today.

4. No other hemodynamically significant stenosis by MRA head and
neck.

## 2021-06-10 IMAGING — DX DG CHEST 1V PORT
1 series · 1 of 1 positions shown · non-contrast
Comparison: Radiograph [DATE]

CLINICAL DATA: Intubation

EXAM:
PORTABLE CHEST 1 VIEW

[chest ap]
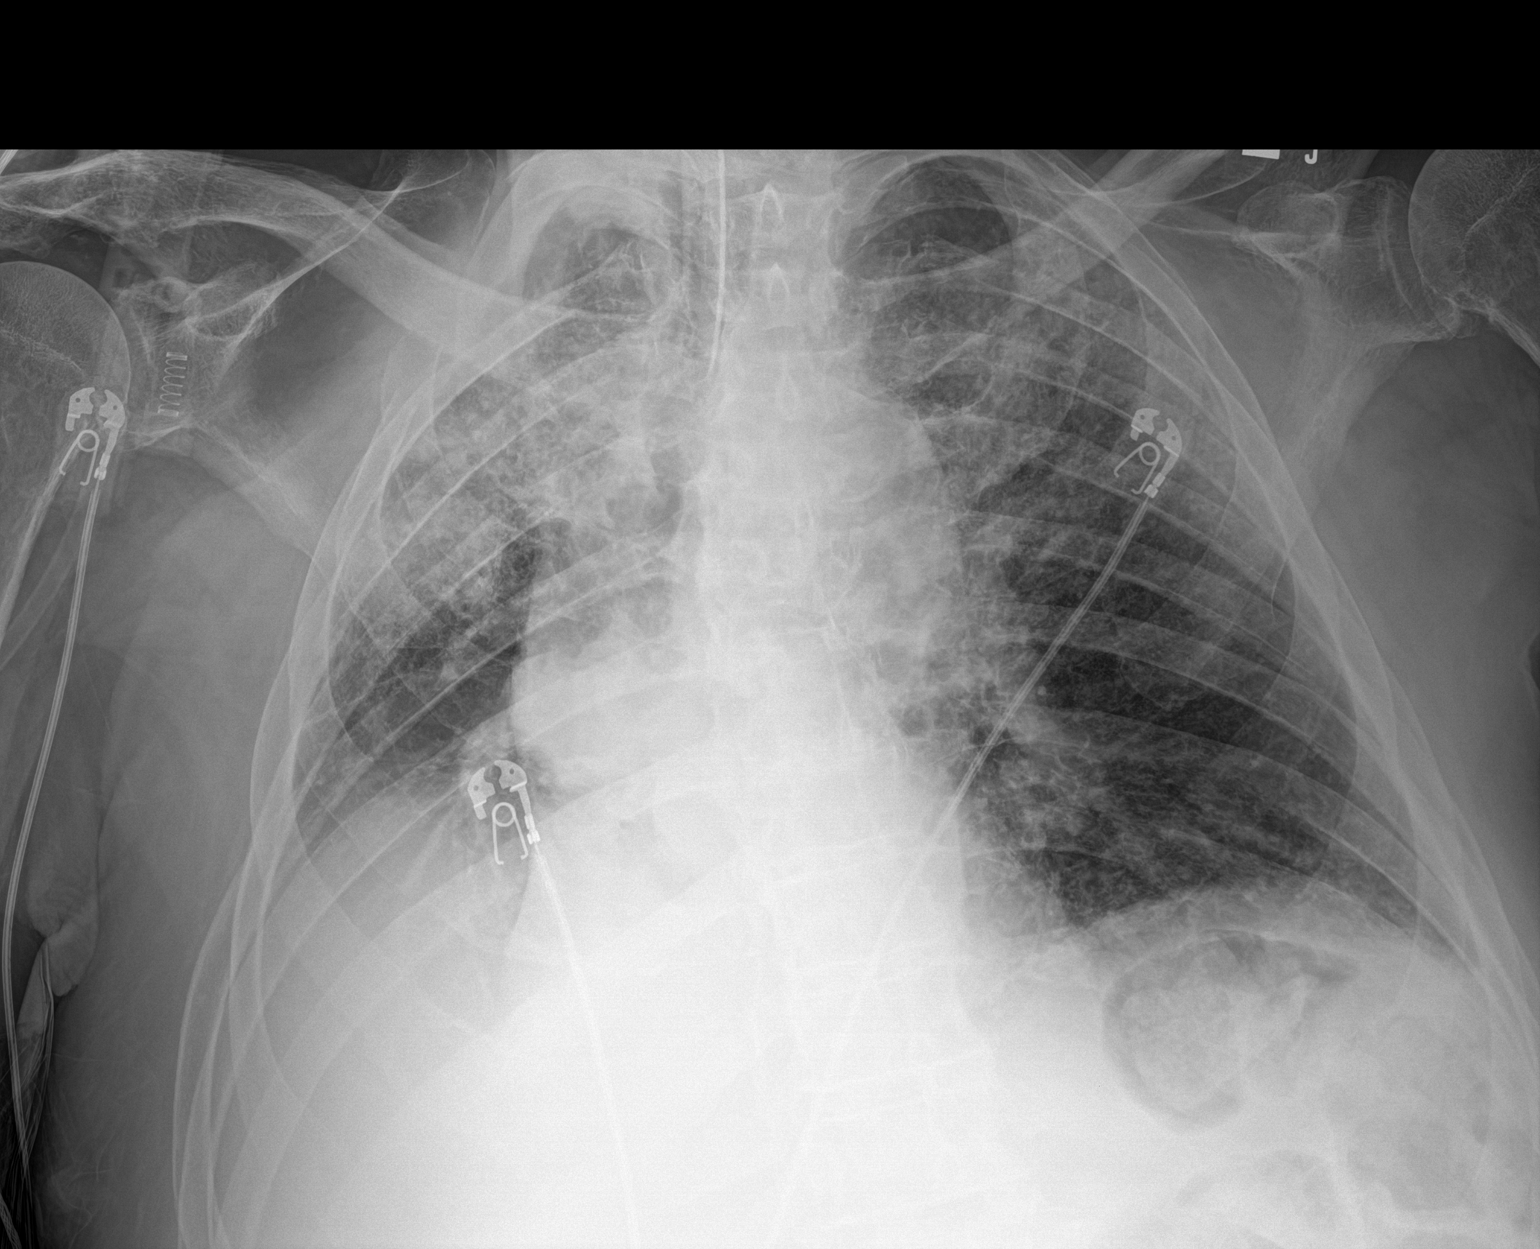

[1 of 1 positions shown; findings below may reference images not displayed]

FINDINGS: Endotracheal tube tip overlies the midthoracic trachea. Unchanged
cardiomediastinal silhouette. No significant change in bilateral
airspace disease, most prominent in the upper lungs, in part related
to a right apical mass with right second and third rib invasion.
Trace pleural effusions. No visible pneumothorax. Bones are
unchanged.
IMPRESSION: Endotracheal tube overlies the midthoracic trachea.

Unchanged multifocal pneumonia and right apical mass with rib
invasion.

## 2021-06-10 IMAGING — MR MR HEAD W/O CM
12 of 13 series · 44 of 48 positions shown · non-contrast
Comparison: CT head [DATE]. CTA head and neck [RG] hours today.

[REDACTED] Brain MRI [DATE].

CLINICAL DATA: 87-year-old male with metastatic lung cancer (right
Pancoast tumor). Fall on Coumadin with supratherapeutic INR (8.8).
But CTA suspicious for pulmonary emboli. Bilateral pneumonia. 5 mm
para falcine subdural hematoma. Neurologic deficit.



[Series 5: DWI · axial · 3.0mm · 0.96mm/px · z∈[-35,+121]mm · 7 of 116 slices shown (1 of 4)]
[im 1/116]
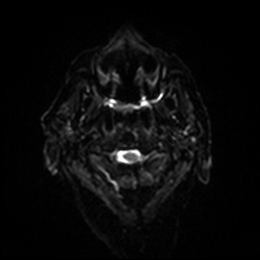
[im 20/116]
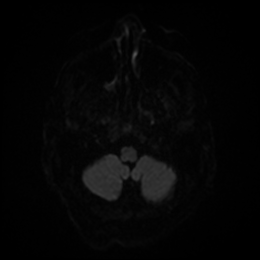
[im 39/116]
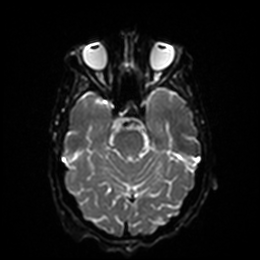
[im 58/116]
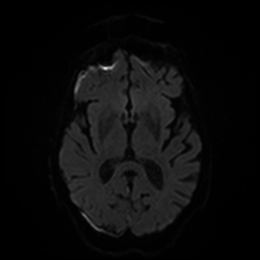
[im 77/116]
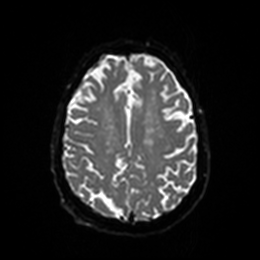
[im 96/116]
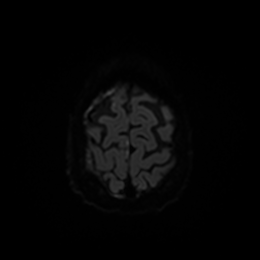
[im 116/116]
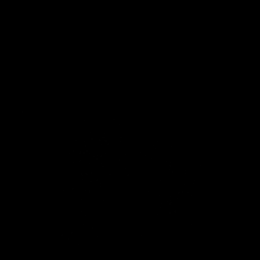

[Series 6: DWI · axial · 3.0mm · 0.96mm/px · z∈[-35,+118]mm · 4 of 57 slices shown (2 of 4)]
[im 1/57]
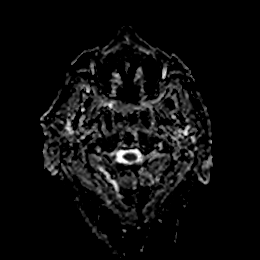
[im 19/57]
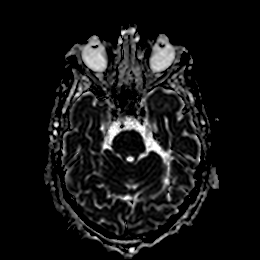
[im 38/57]
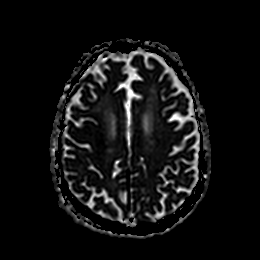
[im 57/57]
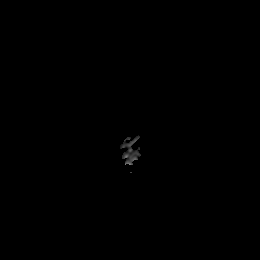

[Series 7: DWI · coronal · 4.0mm · 0.88mm/px · 6 of 86 slices shown (3 of 4)]
[im 1/86]
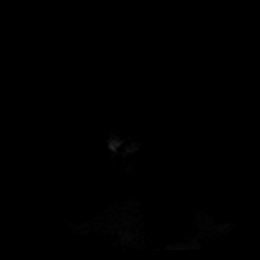
[im 18/86]
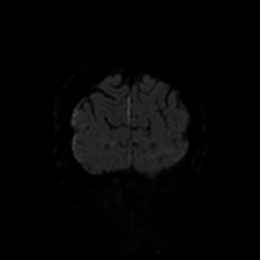
[im 35/86]
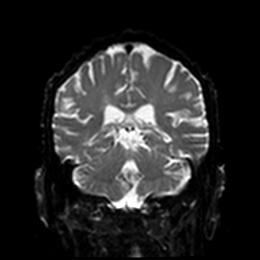
[im 52/86]
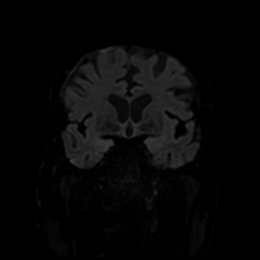
[im 69/86]
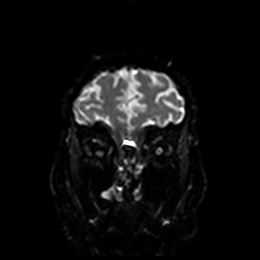
[im 86/86]
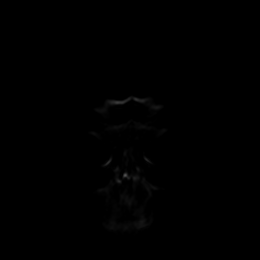

[Series 8: DWI · coronal · 4.0mm · 0.88mm/px · 3 of 43 slices shown (4 of 4)]
[im 1/43]
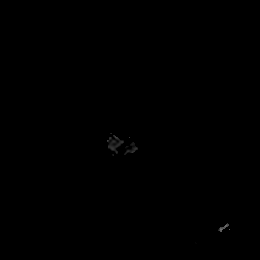
[im 22/43]
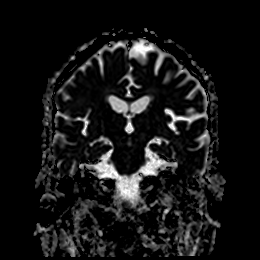
[im 43/43]
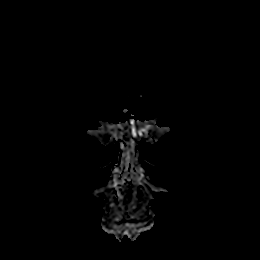

[Series 9: T1 · sagittal · 5.0mm · 0.78mm/px · 2 of 28 slices shown]
[im 1/28]
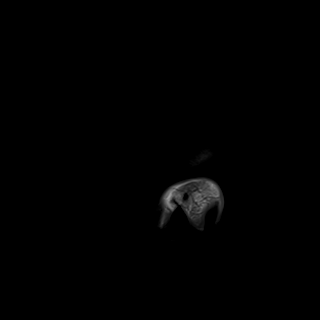
[im 28/28]
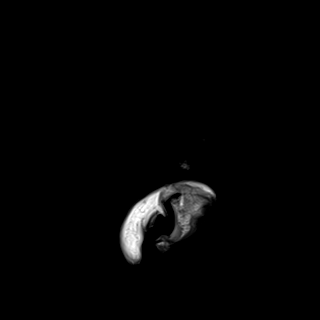

[Series 10: T2 · axial · 5.0mm · 0.78mm/px · z∈[-39,+120]mm · 2 of 30 slices shown (1 of 2)]
[im 1/30]
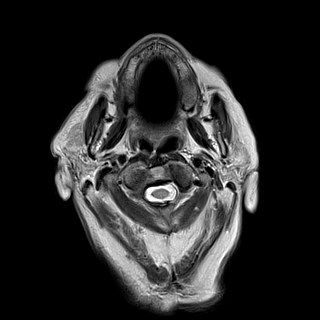
[im 30/30]
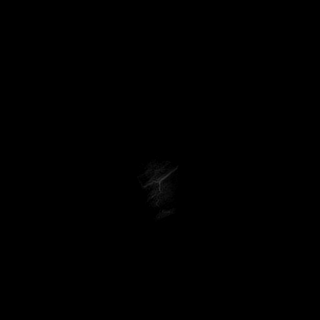

[Series 11: FLAIR · axial · 5.0mm · 0.49mm/px · z∈[-39,+120]mm · 2 of 30 slices shown]
[im 1/30]
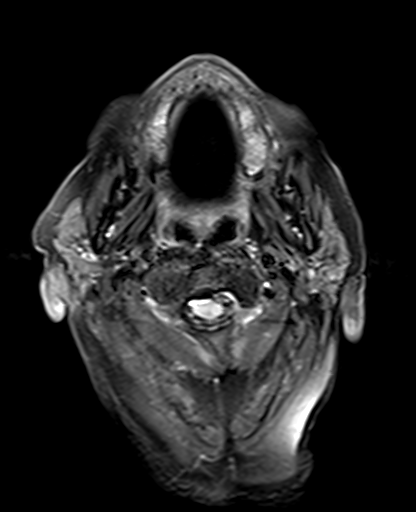
[im 30/30]
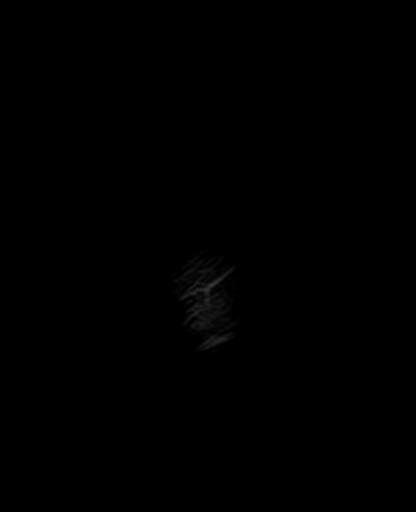

[Series 12: mag_images · axial · 3.0mm · 0.98mm/px · z∈[-38,+124]mm · 4 of 60 slices shown]
[im 1/60]
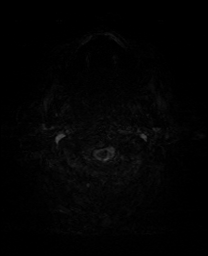
[im 20/60]
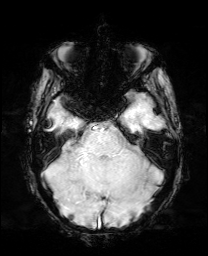
[im 40/60]
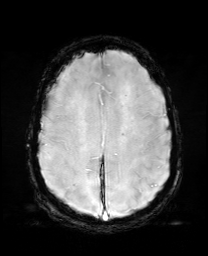
[im 60/60]
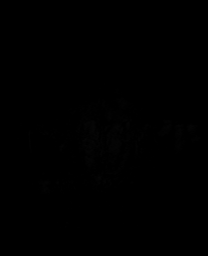

[Series 13: pha_images · axial · 3.0mm · 0.98mm/px · z∈[-35,+124]mm · 4 of 58 slices shown]
[im 1/58]
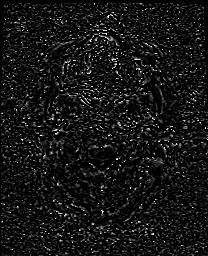
[im 20/58]
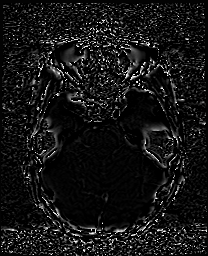
[im 39/58]
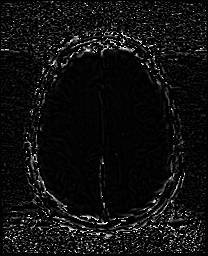
[im 58/58]
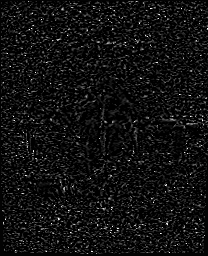

[Series 14: swi_images · axial · 3.0mm · 0.98mm/px · z∈[-38,+124]mm · 4 of 60 slices shown]
[im 1/60]
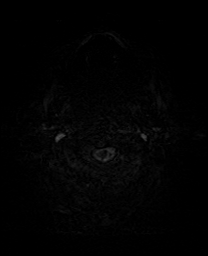
[im 20/60]
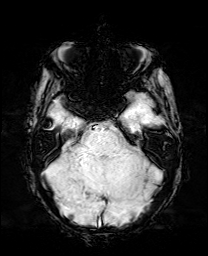
[im 40/60]
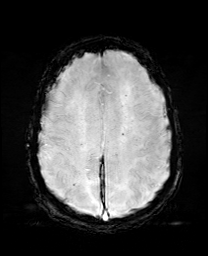
[im 60/60]
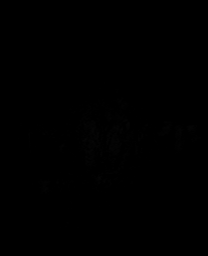

[Series 15: mip_images(sw) · axial · 24.0mm · 0.98mm/px · z∈[-28,+114]mm · 4 of 53 slices shown]
[im 1/53]
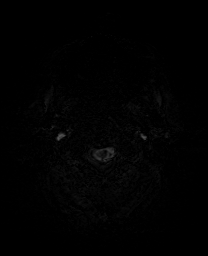
[im 18/53]
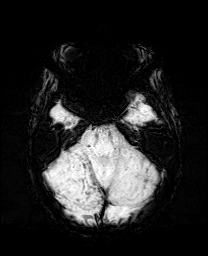
[im 35/53]
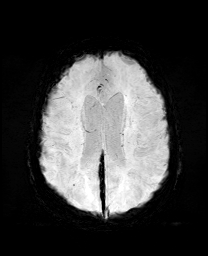
[im 53/53]
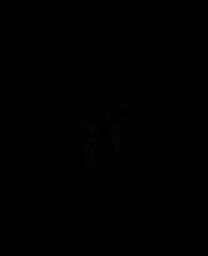

[Series 17: T2 · coronal · 5.0mm · 0.34mm/px · 2 of 36 slices shown (2 of 2)]
[im 1/36]
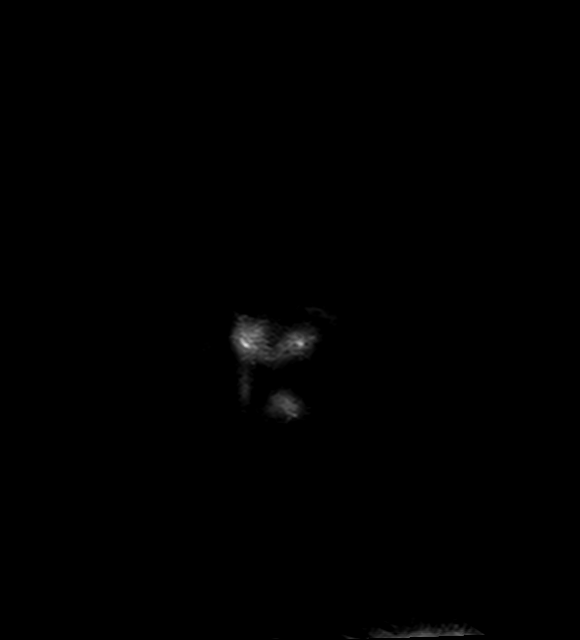
[im 36/36]
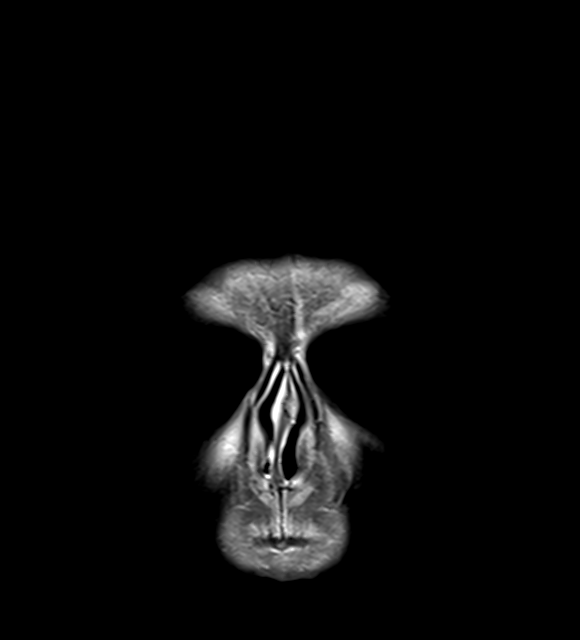

[44 of 48 positions shown; findings below may reference images not displayed]

FINDINGS: MRI HEAD FINDINGS

Brain: 3-4 mm right side subdural hematoma with similar sized para
falcine component (series 11, image 18). No midline shift. No
significant intracranial mass effect. No subarachnoid or
intraventricular blood identified. No ventriculomegaly.

In the left posterior corona radiata white matter there is a ring
like area of abnormal trace diffusion (series 5, image 94) which
appears facilitated on ADC and stable from [facility] MRI without
enhancement at that time compatible with chronic white matter
lacunar infarct.

Punctate restricted diffusion in the right periatrial white matter
on series 5, image 90 which was also vaguely visible in [REDACTED] and
without enhancement.

No restricted diffusion suggestive of acute infarction. No contrast
administered today.

A few scattered chronic microhemorrhages in the brain are more
apparent on SWI today versus T2 * imaging in [REDACTED]. Patchy and
confluent other cerebral white matter T2 and FLAIR hyperintensity is
stable since [REDACTED]. No areas specific for vasogenic edema. No
ventriculomegaly or intracranial mass effect. Small chronic left
cerebellar infarcts are stable. Stable deep gray matter nuclei and
brainstem.

Cervicomedullary junction and pituitary are within normal limits.

Vascular: Major intracranial vascular flow voids are stable since
[REDACTED]. Dominant left vertebral artery.

Skull and upper cervical spine: Negative visible cervical spine.
Visualized bone marrow signal is within normal limits.

Sinuses/Orbits: Stable and negative; small maxillary mucous
retention cysts.

Other: Mastoids remain clear. Visible internal auditory structures
appear normal.

MRA NECK FINDINGS

Time-of-flight neck MRA images reveal antegrade flow in both
cervical carotid and vertebral arteries in the neck and to the skull
base. 3 vessel arch configuration visible on series 14. Left
vertebral artery appears dominant throughout. No evidence of
vertebral artery stenosis.

Irregularity at both carotid bifurcations corresponding to
atherosclerosis demonstrated by CTA earlier today. No evidence of
hemodynamically significant stenosis on the right. However, distal
to the left bulb high-grade left ICA stenosis redemonstrated (series
11, image 82), numerically estimated at 90% by CTA today. The vessel
remains patent.

And 4 vessel antegrade flow continues to the skull base.

MRA HEAD FINDINGS

Antegrade flow in the posterior circulation with dominant left
vertebral artery which supplies the basilar. Distal right vertebral
functionally terminates in PICA as seen by CTA. No distal vertebral
or basilar stenosis. Patent AICA and SCA origins. Fetal type
bilateral PCA origins with tortuous posterior communicating
arteries. Bilateral PCA branches are within normal limits.

Antegrade flow in both ICA siphons. Ectatic appearing siphons with
irregularity in keeping with atherosclerosis by CTA. But no
significant siphon stenosis. Normal posterior communicating artery
origins. Patent carotid termini. Normal MCA and ACA origins. Normal
ophthalmic artery origins. Diminutive or absent anterior
communicating artery. Mildly tortuous ACA branches are within normal
limits. MCA M1 segments, bi/trifurcations and visible left MCA
branches are within normal limits.
IMPRESSION: 1. No acute intracranial abnormality. And no strong evidence of
metastatic disease in the absence of IV contrast.

2. Stable appearance of cerebral small vessel disease since a [DATE]. Neck MRA re-demonstrates high-grade stenosis of the cervical Left
ICA at the distal bulb as seen on CTA earlier today.

4. No other hemodynamically significant stenosis by MRA head and
neck.

## 2021-06-10 MED ORDER — ORAL CARE MOUTH RINSE
15.0000 mL | OROMUCOSAL | Status: DC
  Administered 2021-06-10 – 2021-06-11 (×11): 15 mL via OROMUCOSAL

## 2021-06-10 MED ORDER — CHLORHEXIDINE GLUCONATE 0.12% ORAL RINSE (MEDLINE KIT)
15.0000 mL | Freq: Two times a day (BID) | OROMUCOSAL | Status: DC
  Administered 2021-06-10 – 2021-06-11 (×2): 15 mL via OROMUCOSAL

## 2021-06-10 MED ORDER — LORAZEPAM 2 MG/ML IJ SOLN
INTRAMUSCULAR | Status: AC
  Filled 2021-06-10: qty 1

## 2021-06-10 MED ORDER — ADULT MULTIVITAMIN W/MINERALS CH: 1.0000 | ORAL_TABLET | Freq: Every day | ORAL | Status: DC

## 2021-06-10 MED ORDER — LACTATED RINGERS IV BOLUS
500.0000 mL | Freq: Once | INTRAVENOUS | Status: AC
  Administered 2021-06-10: 500 mL via INTRAVENOUS

## 2021-06-10 MED ORDER — LACTATED RINGERS IV SOLN: INTRAVENOUS | Status: DC

## 2021-06-10 MED ORDER — KETAMINE HCL 50 MG/5ML IJ SOSY
PREFILLED_SYRINGE | INTRAMUSCULAR | Status: AC
  Filled 2021-06-10: qty 5

## 2021-06-10 MED ORDER — SODIUM CHLORIDE 0.9 % IV SOLN
500.0000 mg | INTRAVENOUS | Status: DC
  Administered 2021-06-10 – 2021-06-11 (×2): 500 mg via INTRAVENOUS
  Filled 2021-06-10 (×2): qty 5

## 2021-06-10 MED ORDER — ORAL CARE MOUTH RINSE
15.0000 mL | Freq: Two times a day (BID) | OROMUCOSAL | Status: DC
  Administered 2021-06-10: 15 mL via OROMUCOSAL

## 2021-06-10 MED ORDER — CHLORHEXIDINE GLUCONATE CLOTH 2 % EX PADS
6.0000 | MEDICATED_PAD | Freq: Every day | CUTANEOUS | Status: DC
  Administered 2021-06-10 – 2021-06-21 (×9): 6 via TOPICAL

## 2021-06-10 MED ORDER — HEPARIN (PORCINE) 25000 UT/250ML-% IV SOLN
2100.0000 [IU]/h | INTRAVENOUS | Status: AC
  Administered 2021-06-10: 1400 [IU]/h via INTRAVENOUS
  Administered 2021-06-11: 1800 [IU]/h via INTRAVENOUS
  Administered 2021-06-11: 1550 [IU]/h via INTRAVENOUS
  Administered 2021-06-12 – 2021-06-13 (×2): 1900 [IU]/h via INTRAVENOUS
  Administered 2021-06-14: 2100 [IU]/h via INTRAVENOUS
  Filled 2021-06-10 (×7): qty 250

## 2021-06-10 MED ORDER — IOHEXOL 350 MG/ML SOLN
150.0000 mL | Freq: Once | INTRAVENOUS | Status: AC | PRN
  Administered 2021-06-10: 150 mL via INTRAVENOUS

## 2021-06-10 MED ORDER — MAGNESIUM SULFATE 4 GM/100ML IV SOLN
4.0000 g | Freq: Once | INTRAVENOUS | Status: AC
  Administered 2021-06-10: 4 g via INTRAVENOUS
  Filled 2021-06-10: qty 100

## 2021-06-10 MED ORDER — SODIUM CHLORIDE 0.9 % IV SOLN
1.0000 g | INTRAVENOUS | Status: DC
  Administered 2021-06-10: 1 g via INTRAVENOUS
  Filled 2021-06-10: qty 10

## 2021-06-10 MED ORDER — PROPOFOL 1000 MG/100ML IV EMUL
INTRAVENOUS | Status: AC
  Filled 2021-06-10: qty 100

## 2021-06-10 MED ORDER — LORAZEPAM 2 MG/ML IJ SOLN
1.0000 mg | INTRAMUSCULAR | Status: AC
  Administered 2021-06-10: 1 mg via INTRAVENOUS

## 2021-06-10 NOTE — Progress Notes (Signed)
Neurology and CCM at beside to evaluate patient. Neurology ordered [place]. Patient now alert and oriented x4.

## 2021-06-10 NOTE — TOC CAGE-AID Note (Signed)
Transition of Care [facility]) - CAGE-AID Screening   Patient Details  Name: Adrian Foster MRN: [id number] Date of Birth: [birth year]  [facility] of [facility]) CM/SW Contact:    Janyia Guion C Tarpley-Carter, [place] Phone Number: 06/10/2021, 9:04 AM   Clinical Narrative: Pt is unable to participate in Cage Aid.  Darleen Moffitt Tarpley-Carter, MSW, LCSW-A Pronouns:  She/Her/Hers [facility] Clinical Social Worker Direct Number:  [phone] [email or web address]   CAGE-AID Screening: Substance Abuse Screening unable to be completed due to: : Patient unable to participate             Substance Abuse Education Offered: No

## 2021-06-10 NOTE — Progress Notes (Signed)
   06/10/21 1715  Provider Notification  Provider Name/Title G. Bowser NP  Date Provider Notified 06/10/21  Time Provider Notified 1715  Notification Type Face-to-face  Notification Reason Other (Comment) (low BP)  Provider response See new orders  Date of Provider Response 06/10/21  Time of Provider Response 1720   BP dropped with MAP >60, pt without neuro changes. NP G. Bowser ordered LR bolus and LR continuous gtt.

## 2021-06-10 NOTE — Progress Notes (Signed)
EEG complete - results pending 

## 2021-06-10 NOTE — Progress Notes (Signed)
ABG Results CO2 110 PH 7.092   CCM and Neurology both aware.

## 2021-06-10 NOTE — Procedures (Signed)
Patient Name: Adrian Foster  MRN: [id number]  Epilepsy Attending: Lora Havens  Referring Physician/Provider: Dr Zeb Comfort Date: 06/10/2021 Duration: 22.20 mins  Patient history:  87 year old male with transient episode of decreased responsiveness in the setting of a parafalcine subdural hematoma. EEG to evaluate for seizure  Level of alertness: Awake, asleep  AEDs during EEG study: None  Technical aspects: This EEG study was done with scalp electrodes positioned according to the 10-20 International system of electrode placement. Electrical activity was acquired at a sampling rate of 500Hz  and reviewed with a high frequency filter of 70Hz  and a low frequency filter of 1Hz . EEG data were recorded continuously and digitally stored.   Description: The posterior dominant rhythm consists of 8 Hz activity of moderate voltage (25-35 uV) seen predominantly in posterior head regions, symmetric and reactive to eye opening and eye closing. Sleep was characterized by vertex waves, sleep spindles (12 to 14 Hz), maximal frontocentral region. EEG showed intermittent generalized 3 to 6 Hz theta-delta slowing. Hyperventilation and photic stimulation were not performed.     ABNORMALITY - Intermittent slow, generalized  IMPRESSION: This study is suggestive of mild diffuse encephalopathy, nonspecific etiology. No seizures or epileptiform discharges were seen throughout the recording.  Adrian Foster Adrian Foster

## 2021-06-10 NOTE — Progress Notes (Signed)
Subjective: Was intubated this morning due to hypercapnia and ams. No clinical seizure  ROS: Unable to obtain due to intubation  Examination  Vital signs in last 24 hours: Temp:  [96.9 F (36.1 C)-98.2 F (36.8 C)] 97.4 F (36.3 C) (12/09 0800) Pulse Rate:  [79-113] 87 (12/09 0846) Resp:  [0-42] 18 (12/09 0846) BP: (87-116)/(50-78) 99/72 (12/09 0700) SpO2:  [75 %-100 %] 100 % (12/09 0910) FiO2 (%):  [30 %-100 %] 30 % (12/09 0910) Weight:  [108.9 kg] 108.9 kg (12/08 1241)  General: lying in bed, NAD CVS: pulse-normal rate and rhythm RS: intubated , coarse breath sounds BL Extremities: warm , no edema Neuro: opens eyes to verbal stimulation, follows simple commands, CN 2-12 appear grossly intact, antigravity strength in all extremities  Basic Metabolic Panel: Recent Labs  Lab 06/22/2021 1303 06/10/21 0713 06/10/21 0905  NA 133* 135 134*  K 4.7 4.4 4.1  CL 89* 91*  --   CO2 35* 37*  --   GLUCOSE 161* 120*  --   BUN 26* 20  --   CREATININE 0.78 0.78  --   CALCIUM 9.6 9.8  --   MG  --  1.6*  --   PHOS  --  5.4*  --     CBC: Recent Labs  Lab 06/10/2021 1303 06/10/21 0713 06/10/21 0905  WBC 9.9 6.9  --   NEUTROABS 8.9*  --   --   HGB 12.7* 12.7* 13.3  HCT 41.1 43.8 39.0  MCV 95.4 100.2*  --   PLT 333 279  --      Coagulation Studies: Recent Labs    06/18/2021 1303 06/10/21 0713  LABPROT 72.4* 18.0*  INR 8.8* 1.5*    Imaging  MR brain wo contrast 06/10/2021: No acute intracranial abnormality. And no strong evidence of metastatic disease in the absence of IV contrast.  MRA head and neck 129/2022: Neck MRA re-demonstrates high-grade stenosis of the cervical Left ICA at the distal bulb as seen on CTA earlier today.No other hemodynamically significant stenosis by MRA head and neck.  CTA head anc neck 06/10/2021:  1.Negative CTA for large vessel occlusion.  2. Bulky calcified plaque about the proximal left ICA with associated short-segment severe 90%  stenosis. 3. Atheromatous plaque about the right carotid bulb/proximal right ICA with associated stenosis of up to 40-50% by NASCET criteria. 4. Severe ostial stenoses about the origins of both vertebral arteries. Left vertebral artery dominant. Right vertebral artery terminates in PICA.  [place] wo contrast 06/04/2021: Stable 5 mm posterior para falcine subdural hematoma. No associated mass effect. No interval hemorrhage.  ASSESSMENT AND PLAN: 87 year old male with transient episode of decreased responsiveness in the setting of a parafalcine subdural hematoma.    Transient alteration of awareness - ddx include seizure vs due to hypooxygenation  Recommendation - patient is at higher risk of seizures due to SDH. Therefore, recommend continuing Keppra 500mg  BID for now -However, this was a provoked seizure within 2 weeks of SDH. Therefore, patient's risk of long term epilepsy is much slower.  - Recommend neurology f/u in 2-3 months. Can consider weaning off keppra at that time - continue seizure precautions - management of rest of comorbidities per primary team  I have spent a total of 27   minutes with the patient reviewing hospital notes,  test results, labs and examining the patient as well as establishing an assessment and plan that was discussed personally with the patient's daughter at bedside.  > 50% of time  was spent in direct patient care.   Zeb Comfort Epilepsy [facility] For questions after 5pm please refer to AMION to reach the Neurologist on call

## 2021-06-10 NOTE — Progress Notes (Signed)
Patients pupils now 2 round and sluggish, not following commands, not responding to pain. Neuro notified.

## 2021-06-10 NOTE — Progress Notes (Signed)
RT obtained ABG @0905  and reported results to Dr.Desai. ABG as follows: 7.51/50.8/414/41.2 Dr.Desai increased PEEP to 6. RT decreased FiO2 to 30%. RT decreased tidal volume from 670 (8cc) to 510 (6cc) per MD order.

## 2021-06-10 NOTE — Progress Notes (Signed)
Pt was placed on Bi-PAP for lack of Resp. Drive. RN and MD are aware.

## 2021-06-10 NOTE — Progress Notes (Signed)
Patient now not opening eyes or talking, will squeeze my hands on command. Pupils unchanged and are 4 round and brisk. neuro notified.

## 2021-06-10 NOTE — Progress Notes (Signed)
Cortrak Tube Team Note:  Consult received to place a Cortrak feeding tube.   RD unable advance cortrak tube into the stomach after multiple attempts. RN notified. Recommend Fluoroscopy guided placement of a small bore nasogastric feeding tube.   Koleen Distance MS, RD, LDN Please refer to [facility] for RD and/or RD on-call/weekend/after hours pager

## 2021-06-10 NOTE — Procedures (Signed)
Intubation Procedure Note  Adrian Foster  [id number]  March 17, 1934  Date:06/10/21  Time:7:08 AM   Provider Performing:Marque Bango R [name]    Procedure: Intubation (18841)  Indication(s) Respiratory Failure  Consent Unable to obtain consent due to emergent nature of procedure.   Anesthesia No anesthesia is required    Time Out Verified patient identification, verified procedure, site/side was marked, verified correct patient position, special equipment/implants available, medications/allergies/relevant history reviewed, required imaging and test results available.   Sterile Technique Usual hand hygeine, masks, and gloves were used   Procedure Description Patient positioned in bed supine.  Sedation given as noted above.  Patient was intubated with endotracheal tube using  3 Miller blade .  View was Grade 1 full glottis .  Number of attempts was 1.  Colorimetric CO2 detector was consistent with tracheal placement.   Complications/Tolerance None; patient tolerated the procedure well. Chest X-ray is ordered to verify placement.   EBL None   Specimen(s) None

## 2021-06-10 NOTE — Progress Notes (Signed)
ANTICOAGULATION CONSULT NOTE - Follow Up Consult  Pharmacy Consult for IV Heparin Indication: pulmonary embolus  Allergies  Allergen Reactions   Azithromycin Nausea And Vomiting   Levofloxacin Diarrhea    Patient Measurements: Height: 6\' 3"  (190.5 cm) Weight: 108.9 kg (240 lb) IBW/kg (Calculated) : 84.5 Heparin Dosing Weight: 106.6 kg  Vital Signs: Temp: 98.7 F (37.1 C) (12/09 2323) Temp Source: Axillary (12/09 2323) BP: 87/55 (12/09 2323) Pulse Rate: 85 (12/09 2323)  Labs: Recent Labs    06/21/2021 1303 06/24/2021 1525 06/10/21 0713 06/10/21 0905 06/10/21 2234  HGB 12.7*  --  12.7* 13.3  --   HCT 41.1  --  43.8 39.0  --   PLT 333  --  279  --   --   LABPROT 72.4*  --  18.0*  --   --   INR 8.8*  --  1.5*  --   --   HEPARINUNFRC  --   --   --   --  0.14*  CREATININE 0.78  --  0.78  --   --   TROPONINIHS 91* 67*  --   --   --      Estimated Creatinine Clearance: 90 mL/min (by C-G formula based on SCr of 0.78 mg/dL).   Assessment: 87 years of age male with metastatic prostate cancer receiving radiation and on chronic Warfarin therapy for history of DVT and PE who was admitted post fall with new small SDH. Vitamin K 5mg  IV given x1 for INR of 8.8 on admission. INR now down to 1.5. CT Angio showing multiple small pulmonary embolisms in segmental and subsegmental right lower lobe. Discussed with Dr. Shearon Stalls. Pharmacy consult to start IV Heparin now that INR <2.  No surgical intervention of small SDH recommended at this time.   Heparin level subtherapeutic (0.14) on gtt at 1400 units/hr. No issues with line or bleeding reported per RN.  Goal of Therapy:  Heparin level 0.3 to 0.5 units/ml Monitor platelets by anticoagulation protocol: Yes   Plan:  Increase heparin to 1550 units/hr F/u heparin level in 8 hours  Sherlon Handing, PharmD, BCPS Please see amion for complete clinical pharmacist phone list 06/10/2021,11:40 PM

## 2021-06-10 NOTE — Progress Notes (Signed)
Initial Nutrition Assessment  DOCUMENTATION CODES:  Severe malnutrition in context of chronic illness, Obesity unspecified  INTERVENTION:  Advance diet as medically able and as tolerated.  Add Vital Cuisine Shake TID, each supplement provides 520 kcal and 22 grams of protein - RD to order once diet advances.  Add MVI with minerals daily.  Obtain updated weight.  Encourage PO and supplement intake.  NUTRITION DIAGNOSIS:  Severe Malnutrition related to chronic illness, cancer and cancer related treatments as evidenced by severe muscle depletion, severe fat depletion.  GOAL:  Patient will meet greater than or equal to 90% of their needs.  MONITOR:  Diet advancement, PO intake, Supplement acceptance, Labs, Weight trends, I & O's  REASON FOR ASSESSMENT:  Ventilator    ASSESSMENT:  87 yo male with a PMH of CAD s/p MI, DVT/PE on Coumadin, T2DM, HTN, metastatic prostate cancer w/ mets to lungs on active radiation presents to [facility] on 12/8 after mechanical fall the night prior on coumadin oncologist recommended he come to ED.  Pt intubated to protect his airway this morning. Plan to extubate this afternoon/evening.   Patient is currently intubated on ventilator support. MV: 9.8 L/min Temp (24hrs), Avg:97.7 F (36.5 C), Min:96.9 F (36.1 C), Max:99 F (37.2 C)  Propofol: turned off - weaned   Spoke with pt's daughter at bedside. Pt's daughter reports that pt's wife knows more but she went home.  Pt able to shake head and nod. Pt reports losing a lot of weight, but still having a good appetite. He is okay to proceed with a Cortrak placement at this time.  Spoke with RN. RN reports pt to be extubated this afternoon/evening. In agreement with patient needing a Cortrak tube due to severe depletions.  Addendum: Cortrak team let this RD know that tube cannot advance past stomach. RD to order high-calorie supplements for now once diet advances. If patient continues to eat poorly, a tube  placement can be re-attempted by IR. MD aware of plan.  Pt's weight appears to be stated. RD to order new weight to determine any changes.  Medications: reviewed; Keppra BID via IV  Labs: reviewed; CBG 93-114  NUTRITION - FOCUSED PHYSICAL EXAM: Flowsheet Row Most Recent Value  Orbital Region Severe depletion  Upper Arm Region Moderate depletion  Thoracic and Lumbar Region Moderate depletion  Buccal Region Severe depletion  Temple Region Severe depletion  Clavicle Bone Region Severe depletion  Clavicle and Acromion Bone Region Severe depletion  Scapular Bone Region Moderate depletion  Dorsal Hand Unable to assess  Patellar Region Moderate depletion  Anterior Thigh Region Moderate depletion  Posterior Calf Region Moderate depletion  Edema (RD Assessment) Severe  Hair Reviewed  Eyes Unable to assess  Mouth Unable to assess  Skin Reviewed  Nails Unable to assess   Diet Order:   Diet Order             Diet NPO time specified  Diet effective now                  EDUCATION NEEDS:  Education needs have been addressed  Skin:  Skin Assessment: Reviewed RN Assessment  Last BM:  PTA  Height:  Ht Readings from Last 1 Encounters:  06/05/2021 6\' 3"  (1.905 m)   Weight:  Wt Readings from Last 1 Encounters:  06/19/2021 108.9 kg   BMI:  Body mass index is 30 kg/m.  Estimated Nutritional Needs:  Kcal:  2200-2400 Protein:  125-140 grams Fluid:  >2.2 L  Jinny Blossom  Indiah Heyden, RD, LDN (she/her/hers) Clinical Inpatient Dietitian RD Pager/After-Hours/Weekend Pager # in [place]

## 2021-06-10 NOTE — Progress Notes (Signed)
NAME:  Edith Lord, MRN:  [id number], DOB:  06/28/1934, LOS: 1 ADMISSION DATE:  06/25/2021, CONSULTATION DATE:  12/8 REFERRING MD:  Dr. Matilde Sprang, CHIEF COMPLAINT:  hypoxic respiratory failure/ PE  History of Present Illness:  Patient is a 87 yo M w/ pertinent pmh of CAD s/p MI, DVT/PE on Coumadin, T2DM, HTN, metastatic prostate cancer w/ mets to lungs on active radiation presents to [facility] on 12/8 after mechanical fall the night prior on coumadin oncologist recommended he come to ED.  On arrival to [facility] ED on 12/8, patient was found to be hypoxic in 70s on room air. Sats improved with supplemental O2. Patient has been taking Coumadin for subsegmental PE outpatient. INR was 8.8. CT chest shows PE within segmental and subsegmental branches on RLL. CT head showed 5 mm parafalcine subdural hematoma. Neurosurgery consulted and recommend no surgery and recommend not severe enough to reverse anticoagulation. Vitamin K given to improve INR to allow [place] to start patient on heparin drip. CXR shows possible RLL pneumonia; given ceftriaxone/azithromycin.  PCCM consulted for ICU admission and medical management.    Pertinent  Medical History   Past Medical History:  Diagnosis Date   Diabetes mellitus without complication ([place])    DVT (deep venous thrombosis) ([place])    Hypertension    MI (myocardial infarction) ([place])    Pulmonary embolism ([place])      Significant Hospital Events: Including procedures, antibiotic start and stop dates in addition to other pertinent events   12/8: admitted to [facility] on 12/8 for hypoxia 12/9 early morning intubated for encephalopathy and hypoxemic/hypercapnic respiratory failure  Interim History / Subjective:  Intubated 7am for encephalopathy and hypercapnia.   Talked with wife: patient had been having progressive decline at home over the last few days. Had fall two days prior to presentation and was evaluated by EMS and cleared. But then had progressive confusion/weakness and  came to the ED per PCP recommendation. Found to be altered and hypoxemic.   Objective   Blood pressure (!) 175/140, pulse 89, temperature (!) 97.4 F (36.3 C), temperature source Axillary, resp. rate 13, height 6\' 3"  (1.905 m), weight 108.9 kg, SpO2 100 %.    Vent Mode: PRVC FiO2 (%):  [30 %-100 %] 30 % Set Rate:  [16 bmp-22 bmp] 16 bmp Vt Set:  [510 mL-670 mL] 510 mL PEEP:  [5 cmH20-6 cmH20] 6 cmH20   Intake/Output Summary (Last 24 hours) at 06/10/2021 1043 Last data filed at 06/10/2021 0400 Gross per 24 hour  Intake 905.43 ml  Output --  Net 905.43 ml   Filed Weights   06/04/2021 1241  Weight: 108.9 kg    Examination: Gen:      Intubated, sedated HEENT:  ETT to vent Lungs:    sounds of mechanical ventilation auscultated, diminished bilaterally CV:         RRR no mrg Abd:      + bowel sounds; soft, non-tender; no palpable masses, no distension Ext:    No edema Skin:      Warm and dry; no rashes Neuro:   RASS -1. Follows commands. PERRL   Labs/imaging reviewed personally: 12/9 chest xray multifocal pna with stable RUL mass  MRI angio brain No acute abnormality No metastatic disease High grade stenosis of left cervical ICA  Na 134  ABG showed hypercapnic respiratory failure, subsequent abg improved   Assessment & Plan:   Acute metabolic encephalopathy - probable metabolic from hypercapnia contributing. He is now awake and following commands with  improved ABG for hypercapnia - spot EEG negative for seizure - MRI negative for hemorrhage/stroke - will follow up with neurology  Acute hypoxemia and  hypercapnic respiratory failure Community Acquired Pneumonia Segmental/subsegmental RLL PE:  - continue ceftriaxone azithromycin - chest xray shows multifocal pna with known lung metastases - INR 1.5 down from 8.8 s/p vitamin K, start heparin gtt - based on ABG suspect chronic hypercapnia at baseline as well, wife says patient sleeps upright in recliner  Small SDH: Ct  head 12/8 5 mm parafalcine subdural hematoma  P: -neurosurgery consulted; no surgery recommended -frequent neuro checks -prn labetalol; SBP goal <160 -restart home statin -CBG goal 140-180 -avoid fever  Metastatic prostate cancer w/ mets to lungs: on active radiation Right ischial metastatic lesion: seen on CT 12/8 P: -supportive care -f/u outpatient  Hypovolemic Hyponatremia P: -trend BMP  DMT2 P: -SSI and CBG monitoring goal 14-180 - hold home oral hypoglycemics -check a1c  HTN HLD Hx of MI P: -restart home bp meds -continue home statin -consider restarting ASA tomorrow  Possible ileus vs. Enteritis P: -bowel regimen -monitor bowel movements  Indeterminate right renal lesion P: -recommend MRI for further follow up when stable  Best Practice (right click and "Reselect all SmartList Selections" daily)   Diet/type: NPO w/ oral meds DVT prophylaxis: other; will start on heparin when INR therapeutic level GI prophylaxis: PPI Lines: N/A Foley:  N/A Code Status:  full code Last date of multidisciplinary goals of care discussion [discussed with wife at bedside. ]    Critical Care Time:  The patient is critically ill due to respiratory failure, encephalopathy.  Critical care was necessary to treat or prevent imminent or life-threatening deterioration.  Critical care was time spent personally by me on the following activities: development of treatment plan with patient and/or surrogate as well as nursing, discussions with consultants, evaluation of patient's response to treatment, examination of patient, obtaining history from patient or surrogate, ordering and performing treatments and interventions, ordering and review of laboratory studies, ordering and review of radiographic studies, pulse oximetry, re-evaluation of patient's condition and participation in multidisciplinary rounds.   Critical Care Time devoted to patient care services described in this note is 48  minutes. This time reflects time of care of this [place] . This critical care time does not reflect separately billable procedures or procedure time, teaching time or supervisory time of PA/NP/Med student/Med Resident etc but could involve care discussion time.       Spero Geralds [facility]monary [facility] Critical Care Medicine 06/10/2021 10:43 AM  Pager: see AMION  If no response to pager , please call critical care on call (see AMION) until 7pm After 7:00 pm call [place]

## 2021-06-10 NOTE — Progress Notes (Signed)
Pharmacy Electrolyte Replacement  Recent Labs:  Recent Labs    06/10/21 0713  K 4.4  MG 1.6*  PHOS 5.4*  CREATININE 0.78    Low Critical Values (K </= 2.5, Phos </= 1, Mg </= 1) Present: None  MD Contacted: Dr. Shearon Stalls  Plan: Magnesium 4g IV x1 per protocol. Recheck Mag in AM.   Sloan Leiter, PharmD, BCPS, BCCCP Clinical Pharmacist Please refer to [facility] for [place] numbers 06/10/2021, 9:01 AM

## 2021-06-10 NOTE — Progress Notes (Addendum)
ANTICOAGULATION CONSULT NOTE - Follow Up Consult  Pharmacy Consult for IV Heparin Indication: pulmonary embolus  No Known Allergies  Patient Measurements: Height: 6' 3" (190.5 cm) Weight: 108.9 kg (240 lb) IBW/kg (Calculated) : 84.5 Heparin Dosing Weight: 106.6 kg  Vital Signs: Temp: 99 F (37.2 C) (12/09 1200) Temp Source: Axillary (12/09 1200) BP: 92/70 (12/09 1300) Pulse Rate: 88 (12/09 1300)  Labs: Recent Labs    06/29/2021 1303 06/11/2021 1525 06/10/21 0713 06/10/21 0905  HGB 12.7*  --  12.7* 13.3  HCT 41.1  --  43.8 39.0  PLT 333  --  279  --   LABPROT 72.4*  --  18.0*  --   INR 8.8*  --  1.5*  --   CREATININE 0.78  --  0.78  --   TROPONINIHS 91* 67*  --   --     Estimated Creatinine Clearance: 90 mL/min (by C-G formula based on SCr of 0.78 mg/dL).   Medications:  Scheduled:   chlorhexidine gluconate (MEDLINE KIT)  15 mL Mouth Rinse BID   Chlorhexidine Gluconate Cloth  6 each Topical Q0600   mouth rinse  15 mL Mouth Rinse 10 times per day   Infusions:   sodium chloride Stopped (06/11/2021 1831)   azithromycin (ZITHROMAX) 500 MG IVPB (Vial-Mate [employer])     cefTRIAXone (ROCEPHIN)  IV     levETIRAcetam     propofol      Assessment: 87 years of age male with metastatic prostate cancer receiving radiation and on chronic Warfarin therapy for history of DVT and PE who was admitted post fall with new small SDH. Vitamin K 24m IV given x1 for INR of 8.8 on admission. INR now down to 1.5. CT Angio showing multiple small pulmonary embolisms in segmental and subsegmental right lower lobe. Discussed with Dr. DShearon Stalls Pharmacy consult to start IV Heparin now that INR <2.   CBC within normal limits and stable.  No surgical intervention of small SDH recommended at this time.  Repeat CT without change. MRI clear.  SCr 0.78 - making urine.   Goal of Therapy:  Heparin level 0.3 to 0.5 units/ml Monitor platelets by anticoagulation protocol: Yes   Plan:  No bolus Start  heparin infusion at 1400 units/hr Check anti-Xa level in 8 hours and daily while on heparin Continue to monitor H&H and platelets  JSloan Leiter PharmD, BCPS, BCCCP Clinical Pharmacist Please refer to [facility] 06/10/2021,1:31 PM

## 2021-06-10 NOTE — Progress Notes (Signed)
Neuro notified due to patient not following commands. Pupils 4 round and sluggish. Previously pupils were 4 round and brisk. Stat CT ordered.

## 2021-06-11 ENCOUNTER — Inpatient Hospital Stay (HOSPITAL_COMMUNITY): Payer: Medicare Other

## 2021-06-11 DIAGNOSIS — J9601 Acute respiratory failure with hypoxia: Secondary | ICD-10-CM

## 2021-06-11 DIAGNOSIS — S065XAA Traumatic subdural hemorrhage with loss of consciousness status unknown, initial encounter: Secondary | ICD-10-CM | POA: Diagnosis not present

## 2021-06-11 LAB — CBC
HCT: 34.8 % — ABNORMAL LOW (ref 39.0–52.0)
Hemoglobin: 10.7 g/dL — ABNORMAL LOW (ref 13.0–17.0)
MCH: 29.6 pg (ref 26.0–34.0)
MCHC: 30.7 g/dL (ref 30.0–36.0)
MCV: 96.1 fL (ref 80.0–100.0)
Platelets: 230 10*3/uL (ref 150–400)
RBC: 3.62 MIL/uL — ABNORMAL LOW (ref 4.22–5.81)
RDW: 16.8 % — ABNORMAL HIGH (ref 11.5–15.5)
WBC: 7 10*3/uL (ref 4.0–10.5)
nRBC: 0 % (ref 0.0–0.2)

## 2021-06-11 LAB — GLUCOSE, CAPILLARY
Glucose-Capillary: 74 mg/dL (ref 70–99)
Glucose-Capillary: 77 mg/dL (ref 70–99)
Glucose-Capillary: 72 mg/dL (ref 70–99)
Glucose-Capillary: 83 mg/dL (ref 70–99)
Glucose-Capillary: 113 mg/dL — ABNORMAL HIGH (ref 70–99)

## 2021-06-11 LAB — MAGNESIUM: Magnesium: 2 mg/dL (ref 1.7–2.4)

## 2021-06-11 LAB — HEPARIN LEVEL (UNFRACTIONATED)
Heparin Unfractionated: <0.1 IU/mL — ABNORMAL LOW (ref 0.30–0.70)
Heparin Unfractionated: 0.22 IU/mL — ABNORMAL LOW (ref 0.30–0.70)
Heparin Unfractionated: 0.15 IU/mL — ABNORMAL LOW (ref 0.30–0.70)

## 2021-06-11 IMAGING — DX DG CHEST 1V PORT
1 series · 1 of 1 positions shown · non-contrast
Comparison: Chest x-ray [DATE]

CLINICAL DATA: Abnormal respirations

EXAM:
PORTABLE CHEST 1 VIEW

[chest ap]
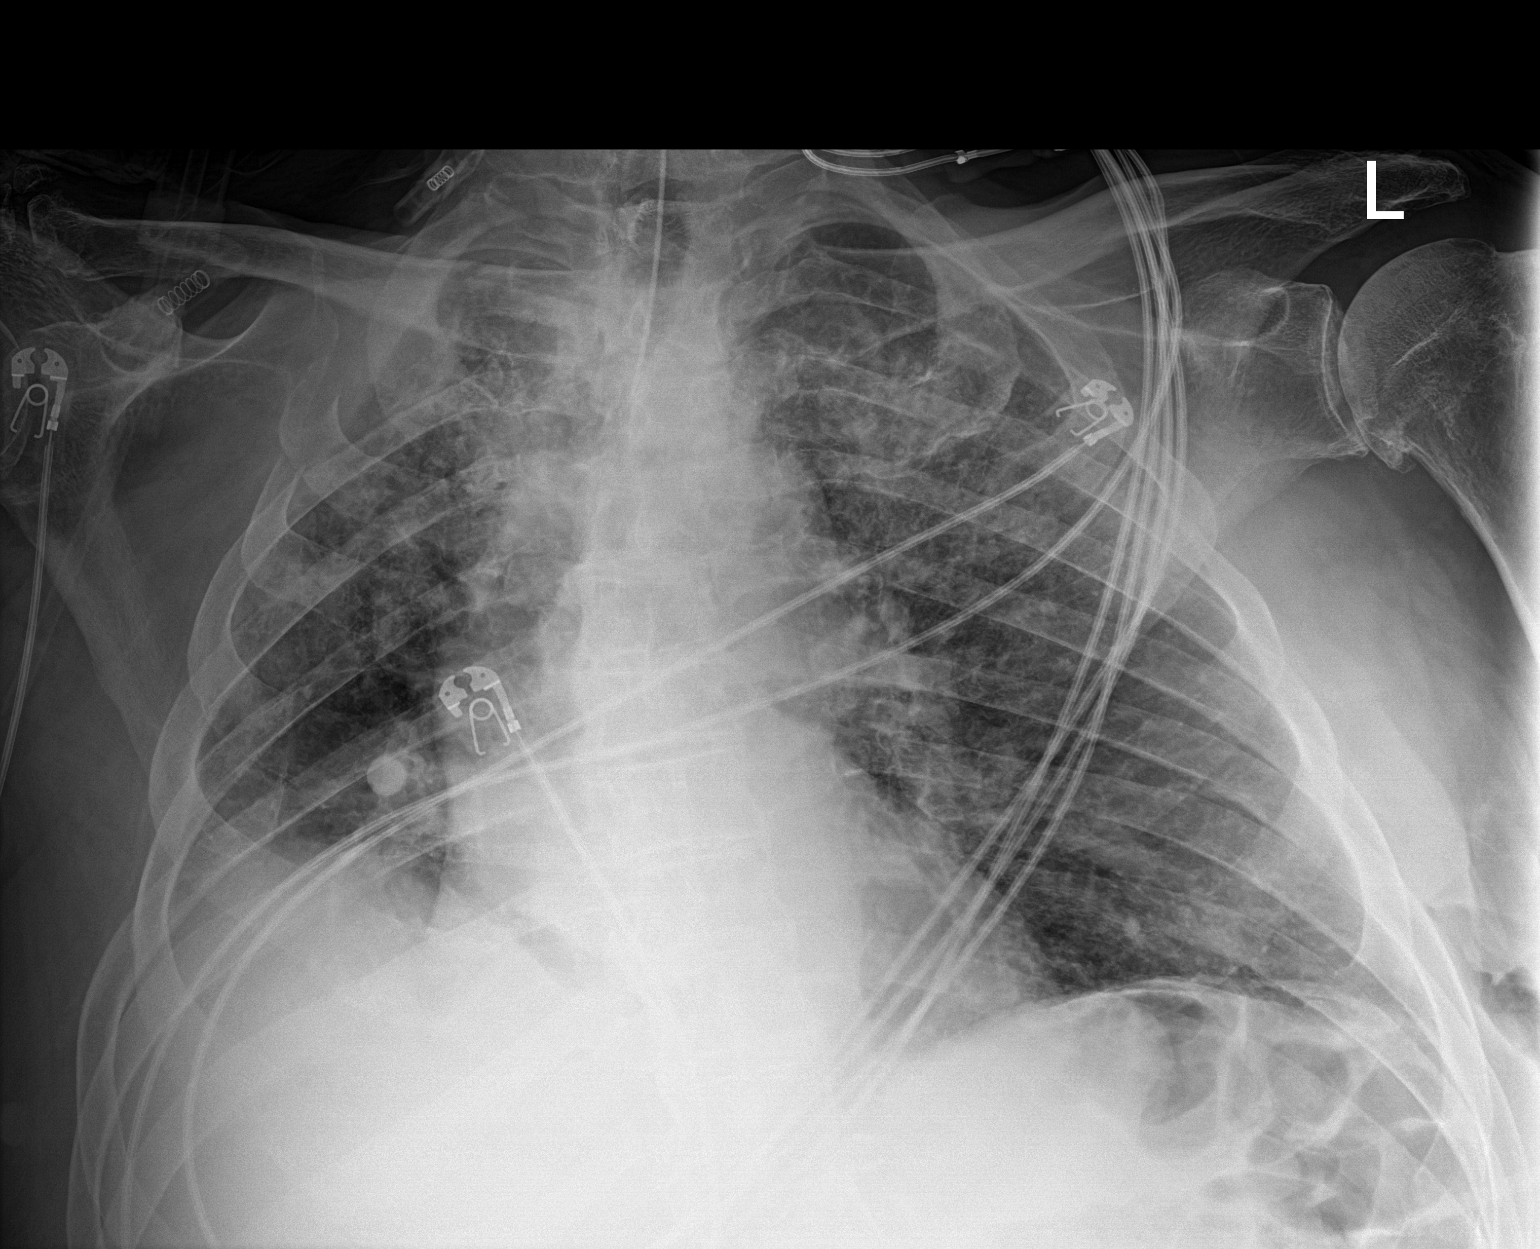

[1 of 1 positions shown; findings below may reference images not displayed]

FINDINGS: Endotracheal tube tip is 3.6 cm above the carina. Cardiomediastinal
silhouette appears grossly stable. Persistent ill-defined
ground-glass airspace infiltrates bilaterally, upper lobe
predominant, with slightly improved aeration on the right since
previous study. No significant pleural effusion. No pneumothorax.
IMPRESSION: 1. Medical devices as described.
2. Persistent bilateral infiltrates with mild improved aeration on
the right since previous study.

## 2021-06-11 MED ORDER — PANTOPRAZOLE 2 MG/ML SUSPENSION: 40.0000 mg | Freq: Every day | ORAL | Status: DC

## 2021-06-11 MED ORDER — PANTOPRAZOLE SODIUM 40 MG IV SOLR
40.0000 mg | INTRAVENOUS | Status: DC
  Administered 2021-06-11 – 2021-06-12 (×2): 40 mg via INTRAVENOUS
  Filled 2021-06-11 (×2): qty 40

## 2021-06-11 MED ORDER — DOCUSATE SODIUM 50 MG/5ML PO LIQD
100.0000 mg | Freq: Two times a day (BID) | ORAL | Status: DC
Start: 2021-06-11 — End: 2021-06-11
  Filled 2021-06-11: qty 10

## 2021-06-11 MED ORDER — DOCUSATE SODIUM 50 MG/5ML PO LIQD: 100.0000 mg | Freq: Two times a day (BID) | ORAL | Status: DC

## 2021-06-11 MED ORDER — FENTANYL CITRATE PF 50 MCG/ML IJ SOSY
PREFILLED_SYRINGE | INTRAMUSCULAR | Status: AC
  Filled 2021-06-11: qty 1

## 2021-06-11 MED ORDER — FENTANYL CITRATE PF 50 MCG/ML IJ SOSY
25.0000 ug | PREFILLED_SYRINGE | INTRAMUSCULAR | Status: DC | PRN
Start: 2021-06-11 — End: 2021-06-12
  Administered 2021-06-11: 25 ug via INTRAVENOUS
  Administered 2021-06-11: 50 ug via INTRAVENOUS
  Filled 2021-06-11: qty 1

## 2021-06-11 MED ORDER — POLYETHYLENE GLYCOL 3350 17 G PO PACK
17.0000 g | PACK | Freq: Every day | ORAL | Status: DC
  Administered 2021-06-11 – 2021-06-22 (×8): 17 g via ORAL
  Filled 2021-06-11 (×11): qty 1

## 2021-06-11 MED ORDER — DOCUSATE SODIUM 50 MG/5ML PO LIQD
100.0000 mg | Freq: Two times a day (BID) | ORAL | Status: DC
  Administered 2021-06-11: 100 mg via ORAL

## 2021-06-11 MED ORDER — ADULT MULTIVITAMIN W/MINERALS CH
1.0000 | ORAL_TABLET | Freq: Every day | ORAL | Status: DC
  Administered 2021-06-12: 1
  Filled 2021-06-11: qty 1

## 2021-06-11 MED ORDER — CEFTRIAXONE SODIUM 2 G IJ SOLR
2.0000 g | INTRAMUSCULAR | Status: DC
  Administered 2021-06-11: 2 g via INTRAVENOUS
  Filled 2021-06-11 (×2): qty 20

## 2021-06-11 MED ORDER — POLYETHYLENE GLYCOL 3350 17 G PO PACK: 17.0000 g | PACK | Freq: Every day | ORAL | Status: DC

## 2021-06-11 MED ORDER — DOCUSATE SODIUM 100 MG PO CAPS
100.0000 mg | ORAL_CAPSULE | Freq: Two times a day (BID) | ORAL | Status: DC
  Administered 2021-06-11 – 2021-06-23 (×17): 100 mg via ORAL
  Filled 2021-06-11 (×21): qty 1

## 2021-06-11 NOTE — Progress Notes (Signed)
NAME:  Adrian Foster, MRN:  [id number], DOB:  01-Apr-1934, LOS: 2 ADMISSION DATE:  06/19/2021, CONSULTATION DATE:  12/8 REFERRING MD:  Dr. Matilde Sprang, CHIEF COMPLAINT:  hypoxic respiratory failure/ PE  History of Present Illness:  Patient is a 87 yo M w/ pertinent pmh of CAD s/p MI, DVT/PE on Coumadin, T2DM, HTN, metastatic prostate cancer w/ mets to lungs on active radiation presents to [facility] on 12/8 after mechanical fall the night prior on coumadin oncologist recommended he come to ED.  On arrival to [facility] ED on 12/8, patient was found to be hypoxic in 70s on room air. Sats improved with supplemental O2. Patient has been taking Coumadin for subsegmental PE outpatient. INR was 8.8. CT chest shows PE within segmental and subsegmental branches on RLL. CT head showed 5 mm parafalcine subdural hematoma. Neurosurgery consulted and recommend no surgery and recommend not severe enough to reverse anticoagulation. Vitamin K given to improve INR to allow [place] to start patient on heparin drip. CXR shows possible RLL pneumonia; given ceftriaxone/azithromycin.  PCCM consulted for ICU admission and medical management.    Pertinent  Medical History   Past Medical History:  Diagnosis Date   Diabetes mellitus without complication ([place])    DVT (deep venous thrombosis) ([place])    Hypertension    MI (myocardial infarction) ([place])    Pulmonary embolism ([place])      Significant Hospital Events: Including procedures, antibiotic start and stop dates in addition to other pertinent events   12/8: admitted to [facility] on 12/8 for hypoxia 12/9 early morning intubated for encephalopathy and hypoxemic/hypercapnic respiratory failure  Interim History / Subjective:  Awake and alert may be able to extubate today  Objective   Blood pressure (!) 103/56, pulse 70, temperature (!) 97.4 F (36.3 C), temperature source Axillary, resp. rate 11, height 6\' 3"  (1.905 m), weight 108.9 kg, SpO2 100 %.    Vent Mode: PSV;CPAP FiO2 (%):  [30 %]  30 % Set Rate:  [16 bmp] 16 bmp Vt Set:  [510 mL] 510 mL PEEP:  [5 cmH20-6 cmH20] 5 cmH20 Pressure Support:  [10 cmH20] 10 cmH20 Plateau Pressure:  [16 cmH20-18 cmH20] 16 cmH20   Intake/Output Summary (Last 24 hours) at 06/11/2021 0949 Last data filed at 06/11/2021 0800 Gross per 24 hour  Intake 2548.15 ml  Output 800 ml  Net 1748.15 ml   Filed Weights   06/15/2021 1241  Weight: 108.9 kg    Examination: General: Elderly male who is awake and alert HEENT: MM pink/moist endotracheal tube is in place Neuro: No apparent focal defects CV: Heart sounds regular PULM: Diminished throughout GI: soft, bsx4 active  GU: Amber urine Extremities: warm/dry, 1+edema  Skin: no rashes or lesions    Recent Labs  Lab 07/01/2021 1303 06/10/21 0713 06/10/21 0905  NA 133* 135 134*  K 4.7 4.4 4.1  CL 89* 91*  --   CO2 35* 37*  --   BUN 26* 20  --   CREATININE 0.78 0.78  --   GLUCOSE 161* 120*  --    Recent Labs  Lab 06/14/2021 1303 06/10/21 0713 06/10/21 0905  HGB 12.7* 12.7* 13.3  HCT 41.1 43.8 39.0  WBC 9.9 6.9  --   PLT 333 279  --       Assessment & Plan:   Acute metabolic encephalopathy  Appears to be resolved Problems negative Neurology is following    Acute hypoxemia and  hypercapnic respiratory failure Community Acquired Pneumonia Segmental/subsegmental RLL PE:  Continue antimicrobial therapy Remains on heparin drip Questionable extubation today    Small SDH: Ct head 12/8 5 mm parafalcine subdural hematoma  P:  Neurosurgery was consulted no interventions Continue neuro check  Labetalol to keep systolic blood pressure less than 160   -  Metastatic prostate cancer w/ mets to lungs: on active radiation Right ischial metastatic lesion: seen on CT 12/8 P: Supportive care  Hypovolemic Hyponatremia Recent Labs  Lab 06/15/2021 1303 06/10/21 0713 06/10/21 0905  NA 133* 135 134*    P: Monitor Bemet  DMT2 CBG (last 3)  Recent Labs     06/10/21 2326 06/11/21 0317 06/11/21 0756  GLUCAP 82 74 77    P: Sliding-scale insulin protocol  HTN HLD Hx of MI P: Home BP meds restarted Statin started Consider restarting ASA    Possible ileus vs. Enteritis P: Continue n.p.o. Bowel regimen   Indeterminate right renal lesion P: MRI is recommended  Best Practice (right click and "Reselect all SmartList Selections" daily)   Diet/type: NPO w/ oral meds DVT prophylaxis: other; will start on heparin when INR therapeutic level GI prophylaxis: PPI Lines: N/A Foley:  N/A Code Status:  full code Last date of multidisciplinary goals of care discussion [discussed with wife at bedside. ]    Critical Care Time: App cct 51 min   Richardson Landry Lakeesha Fontanilla ACNP Acute Care Nurse Practitioner [place] Please consult Amion 06/11/2021, 9:49 AM

## 2021-06-11 NOTE — Progress Notes (Addendum)
Called CCM in regards to pt's BP 77/52 at 1206. RN still awaiting response.   0119: Called CCM back. No new orders given.

## 2021-06-11 NOTE — Progress Notes (Addendum)
ANTICOAGULATION CONSULT NOTE - Follow Up Consult  Pharmacy Consult for IV Heparin Indication: pulmonary embolus  Allergies  Allergen Reactions   Azithromycin Nausea And Vomiting   Levofloxacin Diarrhea    Patient Measurements: Height: 6\' 3"  (190.5 cm) Weight: 108.9 kg (240 lb) IBW/kg (Calculated) : 84.5 Heparin Dosing Weight: 106.6 kg  Vital Signs: Temp: 97.4 F (36.3 C) (12/10 0800) Temp Source: Axillary (12/10 0800) BP: 103/56 (12/10 0800) Pulse Rate: 70 (12/10 0840)  Labs: Recent Labs    06/06/2021 1303 07/01/2021 1525 06/10/21 0713 06/10/21 0905 06/10/21 2234 06/11/21 0756 06/11/21 1034  HGB 12.7*  --  12.7* 13.3  --   --  10.7*  HCT 41.1  --  43.8 39.0  --   --  34.8*  PLT 333  --  279  --   --   --  230  LABPROT 72.4*  --  18.0*  --   --   --   --   INR 8.8*  --  1.5*  --   --   --   --   HEPARINUNFRC  --   --   --   --  0.14* <0.10* 0.22*  CREATININE 0.78  --  0.78  --   --   --   --   TROPONINIHS 91* 67*  --   --   --   --   --      Estimated Creatinine Clearance: 90 mL/min (by C-G formula based on SCr of 0.78 mg/dL).   Assessment: 87 years of age male with metastatic prostate cancer receiving radiation and on chronic Warfarin therapy for history of DVT and PE who was admitted post fall with new small SDH. Vitamin K 5mg  IV given x1 for INR of 8.8 on admission. INR now down to 1.5. CT Angio showing multiple small pulmonary embolisms in segmental and subsegmental right lower lobe. Discussed with Dr. Shearon Stalls. Pharmacy consult to start IV Heparin now that INR <2. No surgical intervention of small SDH recommended at this time.   Heparin level resulted undetectable this AM after rate increased for low level 0.14 overnight. RN states lab was drawn correctly and no issues with heparin drip since her shift began. Will get stat repeat to confirm. CBC wnl (last 12/9). No bleed issues reported.  Goal of Therapy:  Heparin level 0.3 to 0.5 units/ml Monitor platelets by  anticoagulation protocol: Yes   Plan:  Continue heparin at current rate 1550 units/hr for now Check stat repeat heparin level to confirm Monitor daily CBC, s/sx bleeding    Arturo Morton, PharmD, BCPS Please check AMION for all [place] contact numbers Clinical Pharmacist 06/11/2021 10:10 AM  ADDENDUM - Heparin level resulted 0.22 on stat recheck. Will make small adjustment and follow repeat level this evening. Hg down a bit to 10.7, plt wnl.  Plan: Increase heparin to 1600 units/hr Check 6hr heparin level Monitor daily CBC, s/sx bleeding   Arturo Morton, PharmD, BCPS Please check AMION for all [place] contact numbers Clinical Pharmacist 06/11/2021 11:39 AM

## 2021-06-11 NOTE — Progress Notes (Signed)
ANTICOAGULATION CONSULT NOTE - Follow Up Consult  Pharmacy Consult for IV Heparin Indication: pulmonary embolus  Allergies  Allergen Reactions   Azithromycin Nausea And Vomiting   Levofloxacin Diarrhea    Patient Measurements: Height: 6\' 3"  (190.5 cm) Weight: 108.9 kg (240 lb) IBW/kg (Calculated) : 84.5 Heparin Dosing Weight: 106.6 kg  Vital Signs: Temp: 97.4 F (36.3 C) (12/10 0800) Temp Source: Axillary (12/10 0800) BP: 103/56 (12/10 0800) Pulse Rate: 70 (12/10 0840)  Labs: Recent Labs    06/18/2021 1303 06/11/2021 1525 06/10/21 0713 06/10/21 0905 06/10/21 2234 06/11/21 0756  HGB 12.7*  --  12.7* 13.3  --   --   HCT 41.1  --  43.8 39.0  --   --   PLT 333  --  279  --   --   --   LABPROT 72.4*  --  18.0*  --   --   --   INR 8.8*  --  1.5*  --   --   --   HEPARINUNFRC  --   --   --   --  0.14* <0.10*  CREATININE 0.78  --  0.78  --   --   --   TROPONINIHS 91* 67*  --   --   --   --      Estimated Creatinine Clearance: 90 mL/min (by C-G formula based on SCr of 0.78 mg/dL).   Assessment: 87 years of age male with metastatic prostate cancer receiving radiation and on chronic Warfarin therapy for history of DVT and PE who was admitted post fall with new small SDH. Vitamin K 5mg  IV given x1 for INR of 8.8 on admission. INR now down to 1.5. CT Angio showing multiple small pulmonary embolisms in segmental and subsegmental right lower lobe. Discussed with Dr. Shearon Stalls. Pharmacy consult to start IV Heparin now that INR <2. No surgical intervention of small SDH recommended at this time.   Heparin level resulted undetectable this AM after rate increased for low level 0.14 overnight. RN states lab was drawn correctly and no issues with heparin drip since her shift began. Will get stat repeat to confirm. CBC wnl (last 12/9). No bleed issues reported.  Goal of Therapy:  Heparin level 0.3 to 0.5 units/ml Monitor platelets by anticoagulation protocol: Yes   Plan:  Continue heparin at  current rate 1550 units/hr for now Check stat repeat heparin level to confirm Monitor daily CBC, s/sx bleeding   Arturo Morton, PharmD, BCPS Please check AMION for all [place] contact numbers Clinical Pharmacist 06/11/2021 10:10 AM

## 2021-06-11 NOTE — Procedures (Signed)
Extubation Procedure Note  Patient Details:   Name: Adrian Foster DOB: 1934-02-18 MRN: [id number]   Airway Documentation:    Vent end date: 06/11/21 Vent end time: 1210   Evaluation  O2 sats: stable throughout Complications: No apparent complications Patient did tolerate procedure well. Bilateral Breath Sounds: Diminished   Pt extubated to 4L [place] per MD order. Pt had positive cuff leak prior to extubation. No stridor noted. Pt able to voice his name.  Vilinda Blanks 06/11/2021, 12:10 PM

## 2021-06-11 NOTE — Progress Notes (Signed)
ANTICOAGULATION CONSULT NOTE - Follow Up Consult  Pharmacy Consult for IV Heparin Indication: pulmonary embolus  Allergies  Allergen Reactions   Azithromycin Nausea And Vomiting   Levofloxacin Diarrhea    Patient Measurements: Height: 6\' 3"  (190.5 cm) Weight: 108.9 kg (240 lb) IBW/kg (Calculated) : 84.5 Heparin Dosing Weight: 106.6 kg  Vital Signs: Temp: 98.4 F (36.9 C) (12/10 1600) Temp Source: Axillary (12/10 1600) BP: 116/74 (12/10 1800) Pulse Rate: 85 (12/10 1800)  Labs: Recent Labs    06/02/2021 1303 06/27/2021 1525 06/10/21 0713 06/10/21 0905 06/10/21 2234 06/11/21 0756 06/11/21 1034 06/11/21 1951  HGB 12.7*  --  12.7* 13.3  --   --  10.7*  --   HCT 41.1  --  43.8 39.0  --   --  34.8*  --   PLT 333  --  279  --   --   --  230  --   LABPROT 72.4*  --  18.0*  --   --   --   --   --   INR 8.8*  --  1.5*  --   --   --   --   --   HEPARINUNFRC  --   --   --   --    < > <0.10* 0.22* 0.15*  CREATININE 0.78  --  0.78  --   --   --   --   --   TROPONINIHS 91* 67*  --   --   --   --   --   --    < > = values in this interval not displayed.     Estimated Creatinine Clearance: 90 mL/min (by C-G formula based on SCr of 0.78 mg/dL).   Assessment: 87 years of age male with metastatic prostate cancer receiving radiation and on chronic Warfarin therapy for history of DVT and PE who was admitted post fall with new small SDH. Vitamin K 5mg  IV given x1 for INR of 8.8 on admission. INR now down to 1.5. CT Angio showing multiple small pulmonary embolisms in segmental and subsegmental right lower lobe. Discussed with Dr. Shearon Stalls. Pharmacy consult to start IV Heparin now that INR <2. No surgical intervention of small SDH recommended at this time.   Currently on IV heparin at 1600 units/hr. Repeat heparin level remains subtherapeutic at 0.15. No s/s of bleeding per RN and no issues with infusion.   Goal of Therapy:  Heparin level 0.3 to 0.5 units/ml Monitor platelets by  anticoagulation protocol: Yes   Plan:  Increase heparin to 1800 units/hr  F/u AM HL  Monitor daily CBC, s/sx bleeding   Albertina Parr, PharmD., BCPS, BCCCP Clinical Pharmacist Please refer to [facility] for unit-specific pharmacist

## 2021-06-12 ENCOUNTER — Inpatient Hospital Stay (HOSPITAL_COMMUNITY): Payer: Medicare Other

## 2021-06-12 DIAGNOSIS — S065XAA Traumatic subdural hemorrhage with loss of consciousness status unknown, initial encounter: Secondary | ICD-10-CM | POA: Diagnosis not present

## 2021-06-12 DIAGNOSIS — J9601 Acute respiratory failure with hypoxia: Secondary | ICD-10-CM | POA: Diagnosis not present

## 2021-06-12 LAB — CBC WITH DIFFERENTIAL/PLATELET
Abs Immature Granulocytes: 0.06 10*3/uL (ref 0.00–0.07)
Basophils Absolute: 0 10*3/uL (ref 0.0–0.1)
Basophils Relative: 0 %
Eosinophils Absolute: 0 10*3/uL (ref 0.0–0.5)
Eosinophils Relative: 0 %
HCT: 36.8 % — ABNORMAL LOW (ref 39.0–52.0)
Hemoglobin: 10.9 g/dL — ABNORMAL LOW (ref 13.0–17.0)
Immature Granulocytes: 1 %
Lymphocytes Relative: 4 %
Lymphs Abs: 0.3 10*3/uL — ABNORMAL LOW (ref 0.7–4.0)
MCH: 29 pg (ref 26.0–34.0)
MCHC: 29.6 g/dL — ABNORMAL LOW (ref 30.0–36.0)
MCV: 97.9 fL (ref 80.0–100.0)
Monocytes Absolute: 0.3 10*3/uL (ref 0.1–1.0)
Monocytes Relative: 4 %
Neutro Abs: 6.1 10*3/uL (ref 1.7–7.7)
Neutrophils Relative %: 91 %
Platelets: 235 10*3/uL (ref 150–400)
RBC: 3.76 MIL/uL — ABNORMAL LOW (ref 4.22–5.81)
RDW: 17 % — ABNORMAL HIGH (ref 11.5–15.5)
WBC: 6.7 10*3/uL (ref 4.0–10.5)
nRBC: 0 % (ref 0.0–0.2)

## 2021-06-12 LAB — BASIC METABOLIC PANEL
Anion gap: 8 (ref 5–15)
BUN: 14 mg/dL (ref 8–23)
CO2: 35 mmol/L — ABNORMAL HIGH (ref 22–32)
Calcium: 8.8 mg/dL — ABNORMAL LOW (ref 8.9–10.3)
Chloride: 94 mmol/L — ABNORMAL LOW (ref 98–111)
Creatinine, Ser: 0.71 mg/dL (ref 0.61–1.24)
GFR, Estimated: >60 mL/min (ref >60)
Glucose, Bld: 100 mg/dL — ABNORMAL HIGH (ref 70–99)
Potassium: 3.2 mmol/L — ABNORMAL LOW (ref 3.5–5.1)
Sodium: 137 mmol/L (ref 135–145)

## 2021-06-12 LAB — GLUCOSE, CAPILLARY
Glucose-Capillary: 114 mg/dL — ABNORMAL HIGH (ref 70–99)
Glucose-Capillary: 128 mg/dL — ABNORMAL HIGH (ref 70–99)
Glucose-Capillary: 176 mg/dL — ABNORMAL HIGH (ref 70–99)
Glucose-Capillary: 185 mg/dL — ABNORMAL HIGH (ref 70–99)
Glucose-Capillary: 93 mg/dL (ref 70–99)
Glucose-Capillary: 94 mg/dL (ref 70–99)

## 2021-06-12 LAB — PHOSPHORUS: Phosphorus: 2.8 mg/dL (ref 2.5–4.6)

## 2021-06-12 LAB — HEPARIN LEVEL (UNFRACTIONATED)
Heparin Unfractionated: 0.22 IU/mL — ABNORMAL LOW (ref 0.30–0.70)
Heparin Unfractionated: 0.3 IU/mL (ref 0.30–0.70)

## 2021-06-12 LAB — MAGNESIUM: Magnesium: 1.8 mg/dL (ref 1.7–2.4)

## 2021-06-12 IMAGING — DX DG CHEST 1V PORT
1 series · 1 of 1 positions shown · non-contrast
Comparison: Prior chest x-ray [DATE]

CLINICAL DATA: Abnormal respiration

EXAM:
PORTABLE CHEST 1 VIEW

[chest]
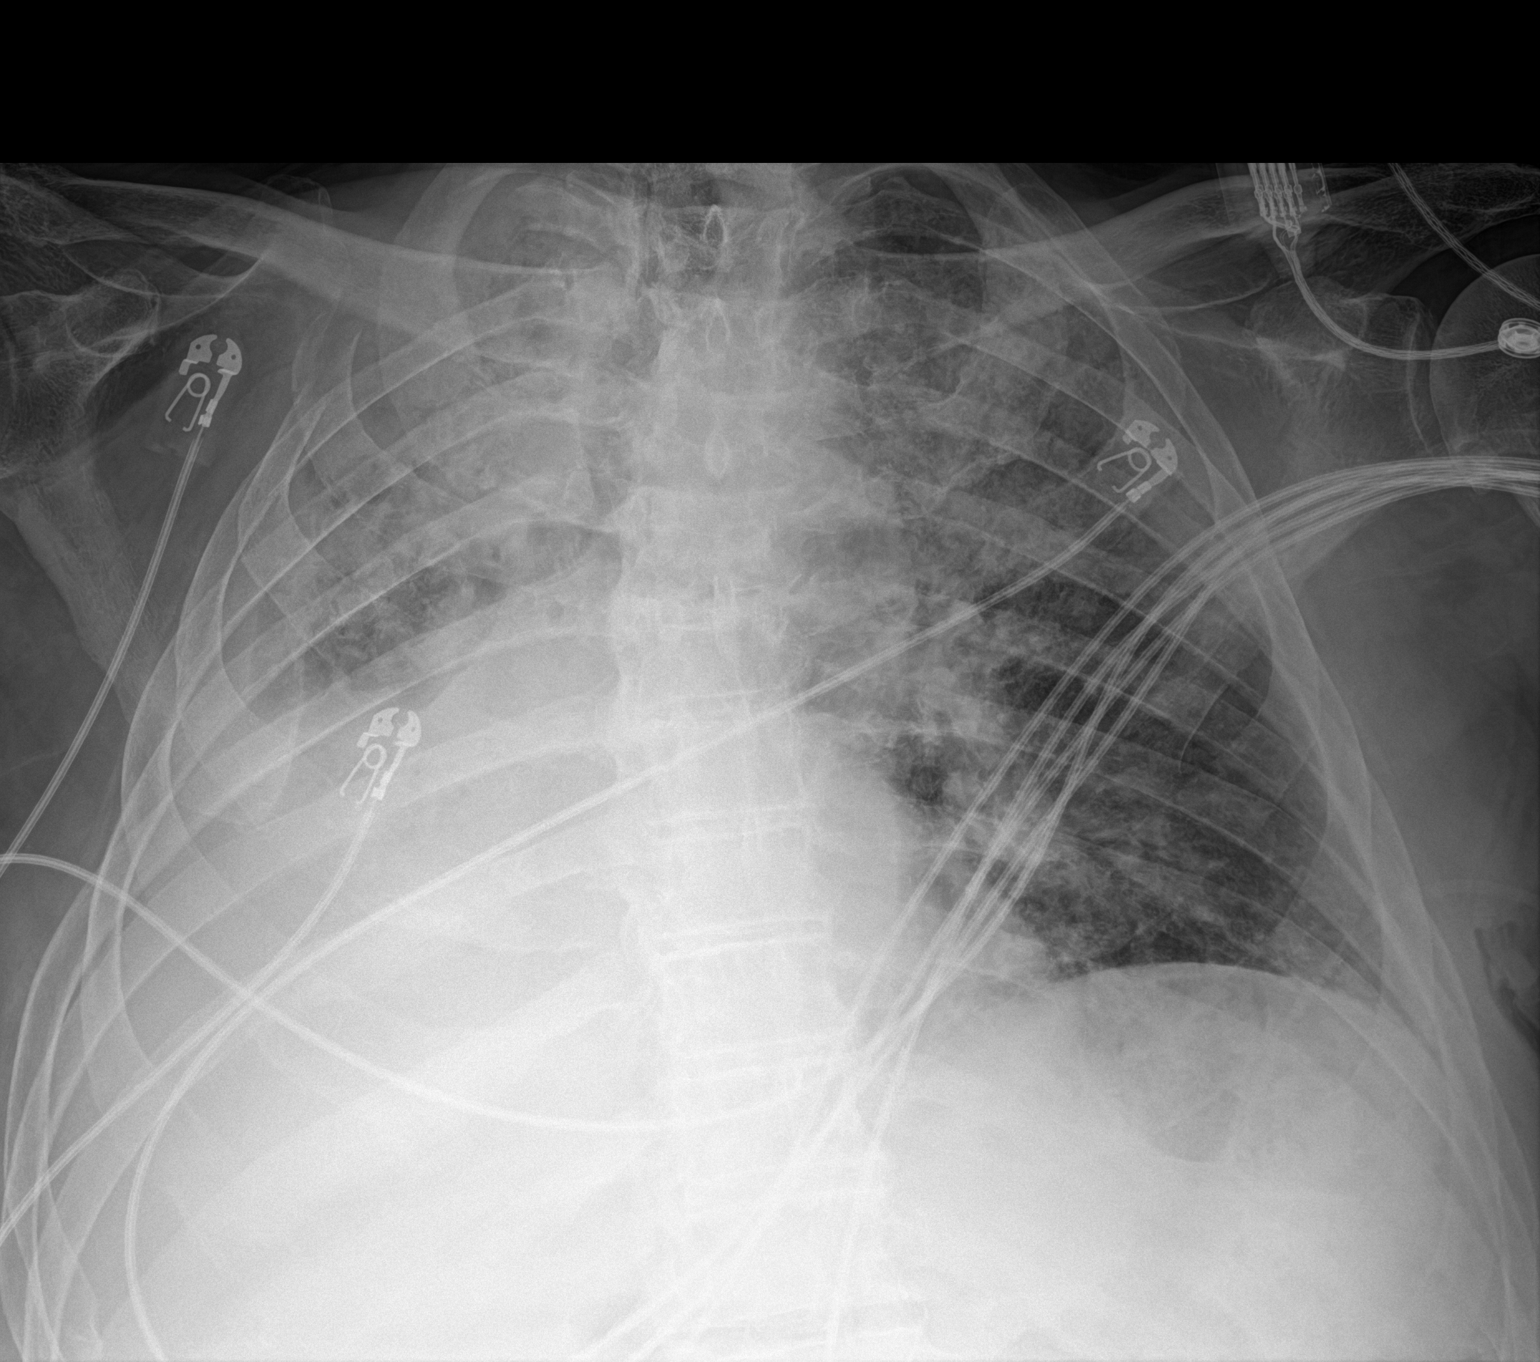

[1 of 1 positions shown; findings below may reference images not displayed]

FINDINGS: The patient has been extubated. Lower lung volumes, particularly on
the right with near-total atelectasis and/or opacification of the
right lung. Extensive bilateral upper lung predominant irregular and
interstitial airspace opacities. Right apical mass with destruction
of the right second and third ribs again noted. No pneumothorax.
IMPRESSION: Significantly diminished aeration, particularly on the right
following extubation.

Diffuse bilateral interstitial and airspace opacities with upper
lung predominance again noted concerning for multifocal pneumonia.

Right apical mass with osseous destruction of the posterolateral
aspects of the right second and third rib again noted.

## 2021-06-12 MED ORDER — ASPIRIN EC 81 MG PO TBEC
81.0000 mg | DELAYED_RELEASE_TABLET | Freq: Every day | ORAL | Status: DC
  Administered 2021-06-12 – 2021-06-23 (×10): 81 mg via ORAL
  Filled 2021-06-12 (×11): qty 1

## 2021-06-12 MED ORDER — WARFARIN SODIUM 5 MG PO TABS
5.0000 mg | ORAL_TABLET | Freq: Once | ORAL | Status: AC
  Administered 2021-06-12: 5 mg via ORAL
  Filled 2021-06-12: qty 1

## 2021-06-12 MED ORDER — ADULT MULTIVITAMIN W/MINERALS CH
1.0000 | ORAL_TABLET | Freq: Every day | ORAL | Status: DC
  Administered 2021-06-13 – 2021-06-23 (×9): 1 via ORAL
  Filled 2021-06-12 (×10): qty 1

## 2021-06-12 MED ORDER — MELATONIN 5 MG PO TABS
5.0000 mg | ORAL_TABLET | Freq: Once | ORAL | Status: DC
  Filled 2021-06-12: qty 1

## 2021-06-12 MED ORDER — PANTOPRAZOLE SODIUM 40 MG PO TBEC
40.0000 mg | DELAYED_RELEASE_TABLET | Freq: Every day | ORAL | Status: DC
  Administered 2021-06-13 – 2021-06-23 (×9): 40 mg via ORAL
  Filled 2021-06-12 (×10): qty 1

## 2021-06-12 MED ORDER — POTASSIUM CHLORIDE 10 MEQ/100ML IV SOLN
10.0000 meq | INTRAVENOUS | Status: AC
  Administered 2021-06-12 (×4): 10 meq via INTRAVENOUS
  Filled 2021-06-12 (×4): qty 100

## 2021-06-12 MED ORDER — WARFARIN - PHARMACIST DOSING INPATIENT: Freq: Every day | Status: DC

## 2021-06-12 MED ORDER — POTASSIUM CHLORIDE CRYS ER 20 MEQ PO TBCR
20.0000 meq | EXTENDED_RELEASE_TABLET | ORAL | Status: AC
  Administered 2021-06-12 (×2): 20 meq via ORAL
  Filled 2021-06-12 (×2): qty 1

## 2021-06-12 MED ORDER — SODIUM CHLORIDE 0.9 % IV SOLN
2.0000 g | Freq: Three times a day (TID) | INTRAVENOUS | Status: DC
  Administered 2021-06-12 – 2021-06-14 (×6): 2 g via INTRAVENOUS
  Filled 2021-06-12 (×6): qty 2

## 2021-06-12 NOTE — Evaluation (Signed)
Physical Therapy Evaluation Patient Details Name: Adrian Foster MRN: [id number] DOB: May 04, 1934 Today's Date: 06/12/2021  History of Present Illness  Patient is a 87 yo M w/ pertinent pmh of CAD s/p MI, DVT/PE on Coumadin, T2DM, HTN, metastatic prostate cancer w/ mets to lungs on active radiation presents to [facility] on 12/8 after mechanical fall.  CT head showed 5 mm parafalcine subdural hematoma. Found to be hypoxic with transient episodes of responsiveness once tx to ICU. CXR- RLL PNA, Chest CT- PEs. Intubated 12/9-12/10.  Clinical Impression  Patient presents with right sided weakness, impaired balance, impaired cognition and impaired mobility s/p above. Pt lives at home with wife and reports being Mod I for ambulation using RW. Has hx of falls. Pt needs some assist with ADLs esp LB dressing. Today, pt requires Mod A of 2 for transfers and taking steps towards the chair due to right lateral lean, right sided weakness and balance deficits. Pt with poor awareness of safety/deficits. Would benefit from CIR to maximize independence and mobility prior to return home. Will follow acutely.     Recommendations for follow up therapy are one component of a multi-disciplinary discharge planning process, led by the attending physician.  Recommendations may be updated based on patient status, additional functional criteria and insurance authorization.  Follow Up Recommendations Acute inpatient rehab (3hours/day)    Assistance Recommended at Discharge Frequent or constant Supervision/Assistance  Functional Status Assessment Patient has had a recent decline in their functional status and demonstrates the ability to make significant improvements in function in a reasonable and predictable amount of time.  Equipment Recommendations  Other (comment) (defer to next venue)    Recommendations for Other Services       Precautions / Restrictions Precautions Precautions: Fall;Other (comment) Precaution Comments:  keep SBP <160 Restrictions Weight Bearing Restrictions: No      Mobility  Bed Mobility Overal bed mobility: Needs Assistance Bed Mobility: Supine to Sit     Supine to sit: Min assist;HOB elevated     General bed mobility comments: MinA overall for trunk elevation; cues to scoot to EOB, posterior bias    Transfers Overall transfer level: Needs assistance Equipment used: Rolling walker (2 wheels) Transfers: Sit to/from Stand;Bed to chair/wheelchair/BSC Sit to Stand: Mod assist;+2 physical assistance;+2 safety/equipment;From elevated surface Stand pivot transfers: Mod assist;+2 physical assistance;+2 safety/equipment;From elevated surface         General transfer comment: assist to power to standing with cues for hand placement/technique, right lateral lean throughout. Able to take a few steps towards chair to right with right lateral lean and assist with balance/RW management.    Ambulation/Gait                  Stairs            Wheelchair Mobility    Modified Rankin (Stroke Patients Only) Modified Rankin (Stroke Patients Only) Pre-Morbid Rankin Score: Moderate disability Modified Rankin: Moderately severe disability     Balance Overall balance assessment: Needs assistance Sitting-balance support: Feet supported;Bilateral upper extremity supported Sitting balance-Leahy Scale: Poor Sitting balance - Comments: Rt lean   Standing balance support: During functional activity;Reliant on assistive device for balance Standing balance-Leahy Scale: Poor Standing balance comment: Mod A for static standing balance with right lateral lean.                             Pertinent Vitals/Pain Pain Assessment: Faces Pain Score: 0-No pain Faces Pain  Scale: No hurt Facial Expression: Relaxed, neutral Body Movements: Absence of movements Muscle Tension: Relaxed Compliance with ventilator (intubated pts.): N/A Vocalization (extubated pts.): Talking in  normal tone or no sound CPOT Total: 0 Pain Intervention(s): Monitored during session    Home Living Family/patient expects to be discharged to:: Private residence Living Arrangements: Spouse/significant other Available Help at Discharge: Family;Available 24 hours/day Type of Home: House Home Access: Stairs to enter Entrance Stairs-Rails: None Entrance Stairs-Number of Steps: 2 steps   Home Layout: One level;Other (Comment) Home Equipment: Shower seat;Rolling Walker (2 wheels);Cane - single point;Grab bars - toilet;Grab bars - tub/shower;Other (comment) Additional Comments: 2 steps to living room and bathroom; door facing hold,  RW on each level of home    Prior Function Prior Level of Function : Needs assist  Cognitive Assist : ADLs (cognitive);Mobility (cognitive) Mobility (Cognitive): Set up cues ADLs (Cognitive): Set up cues Physical Assist : ADLs (physical)   ADLs (physical): Dressing;Bathing Mobility Comments: falls, 2 steps to get around in home ADLs Comments: Pt's spouse was assist with LB ADL due to increased swelling and imbalance when leaving over.     Hand Dominance   Dominant Hand: Right    Extremity/Trunk Assessment   Upper Extremity Assessment Upper Extremity Assessment: Defer to OT evaluation    Lower Extremity Assessment Lower Extremity Assessment: RLE deficits/detail RLE Deficits / Details: Edema in foot worsened than baseline, decreased functional strrength noted with transfer RLE Sensation: WNL LLE Deficits / Details: Edema in foot worsened than baseline    Cervical / Trunk Assessment Cervical / Trunk Assessment: Kyphotic  Communication   Communication: HOH  Cognition Arousal/Alertness: Awake/alert Behavior During Therapy: WFL for tasks assessed/performed;Flat affect Overall Cognitive Status: Impaired/Different from baseline Area of Impairment: Safety/judgement;Awareness;Problem solving                         Safety/Judgement:  Decreased awareness of safety;Decreased awareness of deficits Awareness: Intellectual Problem Solving: Slow processing;Decreased initiation;Difficulty sequencing General Comments: Pt A/O x3; lack of insight into deficits and safety.        General Comments General comments (skin integrity, edema, etc.): Spouse and daughter present in room.    Exercises     Assessment/Plan    PT Assessment Patient needs continued PT services  PT Problem List Decreased strength;Decreased mobility;Decreased balance;Decreased range of motion;Decreased cognition;Decreased activity tolerance;Impaired tone;Decreased safety awareness       PT Treatment Interventions Therapeutic exercise;Patient/family education;Therapeutic activities;Functional mobility training;Neuromuscular re-education;Balance training;Gait training;DME instruction;Wheelchair mobility training    PT Goals (Current goals can be found in the Care Plan section)  Acute Rehab PT Goals Patient Stated Goal: per wife, get back to independence PT Goal Formulation: With patient Time For Goal Achievement: 06/26/21 Potential to Achieve Goals: Good    Frequency Min 4X/week   Barriers to discharge Decreased caregiver support      Co-evaluation PT/OT/SLP Co-Evaluation/Treatment: Yes Reason for Co-Treatment: To address functional/ADL transfers;For patient/therapist safety;Complexity of the patient's impairments (multi-system involvement) PT goals addressed during session: Mobility/safety with mobility;Balance;Strengthening/ROM         AM-PAC PT "6 Clicks" Mobility  Outcome Measure Help needed turning from your back to your side while in a flat bed without using bedrails?: A Little Help needed moving from lying on your back to sitting on the side of a flat bed without using bedrails?: A Little Help needed moving to and from a bed to a chair (including a wheelchair)?: Total Help needed standing up from a  chair using your arms (e.g.,  wheelchair or bedside chair)?: Total Help needed to walk in hospital room?: Total Help needed climbing 3-5 steps with a railing? : Total 6 Click Score: 10    End of Session Equipment Utilized During Treatment: Gait belt Activity Tolerance: Patient limited by fatigue Patient left: in chair;with call bell/phone within reach;with family/visitor present Nurse Communication: Mobility status;Other (comment) (2 person to return to bed) PT Visit Diagnosis: Hemiplegia and hemiparesis;Unsteadiness on feet (R26.81);Difficulty in walking, not elsewhere classified (R26.2) Hemiplegia - Right/Left: Right Hemiplegia - dominant/non-dominant: Dominant Hemiplegia - caused by: Cerebral infarction    Time: 2103-1281 PT Time Calculation (min) (ACUTE ONLY): 34 min   Charges:   PT Evaluation $PT Eval Moderate Complexity: 1 Mod          Marisa Severin, PT, DPT Acute Rehabilitation Services Pager [phone] Office [phone]     Marguarite Arbour A Sabra Heck 06/12/2021, 2:27 PM

## 2021-06-12 NOTE — Final Progress Note (Signed)
Inpatient Rehab Admissions Coordinator:   Per therapy recommendations,  patient was screened for CIR candidacy by Clemens Catholic, MS, CCC-SLP . At this time, Pt. Appears to demonstrate medical necessity, functional decline, and ability to tolerate intensity of CIR. Pt. is a potential candidate for CIR. I will request   order for rehab consult per protocol for full assessment. Please contact me any with questions.  Clemens Catholic, [place], [place] Admissions Coordinator  [phone] ([place]) [phone] (office)

## 2021-06-12 NOTE — Evaluation (Signed)
Clinical/Bedside Swallow Evaluation Patient Details  Name: Adrian Foster MRN: [id number] Date of Birth: February 05, 1934  Today's Date: 06/12/2021 Time: SLP Start Time (ACUTE ONLY): 1420 SLP Stop Time (ACUTE ONLY): 1430 SLP Time Calculation (min) (ACUTE ONLY): 10 min  Past Medical History:  Past Medical History:  Diagnosis Date   Diabetes mellitus without complication ([place])    DVT (deep venous thrombosis) ([facility])    Hypertension    MI (myocardial infarction) ([place])    Pulmonary embolism ([place])    Past Surgical History:  Past Surgical History:  Procedure Laterality Date   CHOLECYSTECTOMY     REPLACEMENT TOTAL KNEE BILATERAL     VENA CAVA FILTER PLACEMENT     HPI:  Patient is a 87 yo M w/ pertinent pmh of CAD s/p MI, DVT/PE on Coumadin, T2DM, HTN, metastatic prostate cancer w/ mets to lungs on active radiation presents to [facility] on 12/8 after mechanical fall.  CT head showed 5 mm parafalcine subdural hematoma. CXR shows possible RLL pneumonia; given ceftriaxone/azithromycin. Rn has observed pt to cough with large sips of water.    Assessment / Plan / Recommendation  Clinical Impression  Pt demonstrates signs of oropharyngeal dyspahgia characterized by multiple swallows and wet coughing after sips of water. Pt was able to consume purees without significant wet vocal quality or mulitple swallows. Given history of recent radaition and new neurologic impairment as well as dx of pneumonia, pt will need instrumental assessment to evaluate swallowing. Asked RN to give meds in puree and offer only small controlled sips of water as requested by pt until MBS tomorrow. SLP Visit Diagnosis: Dysphagia, oropharyngeal phase (R13.12)    Aspiration Risk  Severe aspiration risk    Diet Recommendation NPO except meds   Medication Administration: Whole meds with puree Supervision: Staff to assist with self feeding Compensations: Slow rate;Small sips/bites Postural Changes: Seated upright at 90 degrees     Other  Recommendations Oral Care Recommendations: Oral care BID    Recommendations for follow up therapy are one component of a multi-disciplinary discharge planning process, led by the attending physician.  Recommendations may be updated based on patient status, additional functional criteria and insurance authorization.  Follow up Recommendations Acute inpatient rehab (3hours/day)      Assistance Recommended at Discharge    Functional Status Assessment Patient has had a recent decline in their functional status and demonstrates the ability to make significant improvements in function in a reasonable and predictable amount of time.  Frequency and Duration min 2x/week  2 weeks       Prognosis        Swallow Study   General HPI: Patient is a 87 yo M w/ pertinent pmh of CAD s/p MI, DVT/PE on Coumadin, T2DM, HTN, metastatic prostate cancer w/ mets to lungs on active radiation presents to [facility] on 12/8 after mechanical fall.  CT head showed 5 mm parafalcine subdural hematoma. CXR shows possible RLL pneumonia; given ceftriaxone/azithromycin. Rn has observed pt to cough with large sips of water. Type of Study: Bedside Swallow Evaluation Previous Swallow Assessment: none Diet Prior to this Study: Regular;Thin liquids Temperature Spikes Noted: No Respiratory Status: Room air History of Recent Intubation: No Behavior/Cognition: Alert;Cooperative;Requires cueing Oral Cavity Assessment: Within Functional Limits Oral Care Completed by SLP: No Oral Cavity - Dentition: Dentures, top;Dentures, bottom Vision: Functional for self-feeding Self-Feeding Abilities: Able to feed self Patient Positioning: Upright in bed Baseline Vocal Quality: Normal Volitional Cough: Strong;Congested Volitional Swallow: Able to elicit    Oral/Motor/Sensory Function  Overall Oral Motor/Sensory Function: Within functional limits   Ice Chips     Thin Liquid Thin Liquid: Impaired Presentation: Cup;Straw Pharyngeal   Phase Impairments: Multiple swallows;Cough - Immediate    Nectar Thick Nectar Thick Liquid: Not tested   Honey Thick Honey Thick Liquid: Not tested   Puree Puree: Within functional limits   Solid     Solid: Not tested      Lynann Beaver 06/12/2021,3:04 PM

## 2021-06-12 NOTE — Progress Notes (Signed)
[facility] Progress Note Patient Name: Adrian Foster DOB: 10-Dec-1933 MRN: [id number]   Date of Service  06/12/2021  HPI/Events of Note  Patient restless and asking for sleep aid  eICU Interventions  Melatonin 5 mg ordered        Neola Worrall Rodman Pickle 06/12/2021, 9:55 PM

## 2021-06-12 NOTE — Progress Notes (Signed)
[facility] ADULT ICU REPLACEMENT PROTOCOL   The patient does apply for the [facility] Adult ICU Electrolyte Replacment Protocol based on the criteria listed below:   1.Exclusion criteria: TCTS patients, ECMO patients, and Dialysis patients 2. Is GFR >/= 30 ml/min? Yes.    Patient's GFR today is >60 3. Is SCr </= 2? Yes.   Patient's SCr is 0.71 mg/dL 4. Did SCr increase >/= 0.5 in 24 hours? No. 5.Pt's weight >40kg  Yes.   6. Abnormal electrolyte(s): K 3.2  7. Electrolytes replaced per protocol 8.  Call MD STAT for K+ </= 2.5, Phos </= 1, or Mag </= 1 Physician:    Ronda Fairly A 06/12/2021 5:06 AM

## 2021-06-12 NOTE — Progress Notes (Signed)
ANTICOAGULATION CONSULT NOTE - Follow Up Consult  Pharmacy Consult for IV Heparin Indication: pulmonary embolus  Allergies  Allergen Reactions   Azithromycin Nausea And Vomiting   Levofloxacin Diarrhea    Patient Measurements: Height: 6\' 3"  (190.5 cm) Weight: 108.9 kg (240 lb) IBW/kg (Calculated) : 84.5 Heparin Dosing Weight: 106.6 kg  Vital Signs: BP: 110/63 (12/11 0300) Pulse Rate: 83 (12/11 0300)  Labs: Recent Labs    06/10/2021 1303 06/10/2021 1525 06/10/21 0713 06/10/21 0905 06/10/21 2234 06/11/21 1034 06/11/21 1951 06/12/21 0312  HGB 12.7*  --  12.7* 13.3  --  10.7*  --  10.9*  HCT 41.1  --  43.8 39.0  --  34.8*  --  36.8*  PLT 333  --  279  --   --  230  --  235  LABPROT 72.4*  --  18.0*  --   --   --   --   --   INR 8.8*  --  1.5*  --   --   --   --   --   HEPARINUNFRC  --   --   --   --    < > 0.22* 0.15* 0.22*  CREATININE 0.78  --  0.78  --   --   --   --   --   TROPONINIHS 91* 67*  --   --   --   --   --   --    < > = values in this interval not displayed.     Estimated Creatinine Clearance: 90 mL/min (by C-G formula based on SCr of 0.78 mg/dL).   Assessment: 87 years of age male with metastatic prostate cancer receiving radiation and on chronic Warfarin therapy for history of DVT and PE who was admitted post fall with new small SDH. Vitamin K 5mg  IV given x1 for INR of 8.8 on admission. INR now down to 1.5. CT Angio showing multiple small pulmonary embolisms in segmental and subsegmental right lower lobe. Discussed with Dr. Shearon Stalls. Pharmacy consult to start IV Heparin now that INR <2. No surgical intervention of small SDH recommended at this time.   Currently on IV heparin at 1600 units/hr. Repeat heparin level remains subtherapeutic at 0.22   Goal of Therapy:  Heparin level 0.3 to 0.5 units/ml Monitor platelets by anticoagulation protocol: Yes   Plan:  Increase heparin to 1900 units/hr  Heparin level in 8 hours  Hildred Laser, PharmD Clinical  Pharmacist **Pharmacist phone directory can now be found on [email or web address] ([email or web address]).  Listed under [place].

## 2021-06-12 NOTE — Evaluation (Signed)
Occupational Therapy Evaluation Patient Details Name: Adrian Foster MRN: [id number] DOB: August 13, 1933 Today's Date: 06/12/2021   History of Present Illness Patient is a 87 yo M w/ pertinent pmh of CAD s/p MI, DVT/PE on Coumadin, T2DM, HTN, metastatic prostate cancer w/ mets to lungs on active radiation presents to [facility] on 12/8 after mechanical fall.  CT head showed 5 mm parafalcine subdural hematoma.   Clinical Impression   Pt PTA: Pt living with spouse, active radiation; was ambulatory in the home with RW and requiring assist for lower body ADL, but set-upA for upper body ADL. Pt had recent falls with R sided lean that has since worsened. Pt currently,  A/O x4; lack of insight into deficits and safety. Pt limited by decreased strength, decreased activity tolerance and decreased ability to care for self. Pt set-upA to maxA for ADL and minA for bed mobility; ModA+2 for transfers with RW. Pt would greatly benefit from continued OT skilled services. OT following acutely.     Recommendations for follow up therapy are one component of a multi-disciplinary discharge planning process, led by the attending physician.  Recommendations may be updated based on patient status, additional functional criteria and insurance authorization.   Follow Up Recommendations  Acute inpatient rehab (3hours/day)    Assistance Recommended at Discharge Frequent or constant Supervision/Assistance  Functional Status Assessment  Patient has had a recent decline in their functional status and demonstrates the ability to make significant improvements in function in a reasonable and predictable amount of time.  Equipment Recommendations  BSC/3in1    Recommendations for Other Services Rehab consult     Precautions / Restrictions Precautions Precautions: Fall Restrictions Weight Bearing Restrictions: No      Mobility Bed Mobility Overal bed mobility: Needs Assistance Bed Mobility: Supine to Sit     Supine to sit:  Min assist;HOB elevated     General bed mobility comments: MinA overall for trunk elevation; cues to scoot to EOB    Transfers Overall transfer level: Needs assistance Equipment used: Rolling walker (2 wheels) Transfers: Sit to/from Stand;Bed to chair/wheelchair/BSC Sit to Stand: Mod assist;+2 physical assistance;+2 safety/equipment;From elevated surface Stand pivot transfers: Mod assist;+2 physical assistance;+2 safety/equipment;From elevated surface         General transfer comment: bed elevated; chair next to bed heavy lean on R side      Balance Overall balance assessment: Needs assistance Sitting-balance support: Bilateral upper extremity supported;Feet supported Sitting balance-Leahy Scale: Poor Sitting balance - Comments: R lean   Standing balance support: Reliant on assistive device for balance;Bilateral upper extremity supported Standing balance-Leahy Scale: Poor Standing balance comment: R lean                           ADL either performed or assessed with clinical judgement   ADL Overall ADL's : Needs assistance/impaired Eating/Feeding: Minimal assistance;Sitting   Grooming: Minimal assistance;Sitting   Upper Body Bathing: Minimal assistance;Sitting   Lower Body Bathing: Maximal assistance;Cueing for safety;Cueing for sequencing;Sitting/lateral leans;Sit to/from stand   Upper Body Dressing : Minimal assistance;Sitting   Lower Body Dressing: Maximal assistance;Sitting/lateral leans;Sit to/from stand   Toilet Transfer: Rolling walker (2 wheels);Moderate assistance;+2 for physical assistance;+2 for safety/equipment;Cueing for safety;Cueing for sequencing;Stand-pivot;BSC/3in1 Toilet Transfer Details (indicate cue type and reason): stand pivot to recliner (simulation) Toileting- Clothing Manipulation and Hygiene: Maximal assistance;Sitting/lateral lean;Sit to/from stand       Functional mobility during ADLs: Minimal assistance;+2 for physical  assistance;+2 for safety/equipment;Standard walker;Cueing for safety;Cueing  for sequencing General ADL Comments: Pt limited by decreased strength, decreased activity tolerance and decreased ability to care for self.     Vision Baseline Vision/History: 1 Wears glasses Ability to See in Adequate Light: 0 Adequate Patient Visual Report: No change from baseline Vision Assessment?: No apparent visual deficits Additional Comments: able to read clock     Perception     Praxis      Pertinent Vitals/Pain Pain Assessment: 0-10 Pain Score: 0-No pain Faces Pain Scale: No hurt Facial Expression: Relaxed, neutral Body Movements: Absence of movements Muscle Tension: Relaxed Compliance with ventilator (intubated pts.): N/A Vocalization (extubated pts.): Talking in normal tone or no sound CPOT Total: 0 Pain Intervention(s): Monitored during session     Hand Dominance Right   Extremity/Trunk Assessment Upper Extremity Assessment Upper Extremity Assessment: Generalized weakness   Lower Extremity Assessment Lower Extremity Assessment: Generalized weakness;RLE deficits/detail;LLE deficits/detail RLE Deficits / Details: increased swelling LLE Deficits / Details: increased swelling   Cervical / Trunk Assessment Cervical / Trunk Assessment: Kyphotic   Communication Communication Communication: HOH   Cognition Arousal/Alertness: Awake/alert Behavior During Therapy: WFL for tasks assessed/performed;Flat affect Overall Cognitive Status: Impaired/Different from baseline Area of Impairment: Safety/judgement;Awareness;Problem solving                         Safety/Judgement: Decreased awareness of safety;Decreased awareness of deficits Awareness: Intellectual Problem Solving: Slow processing;Decreased initiation;Difficulty sequencing General Comments: Pt A/O x4; lack of insight into deficits and safety.     General Comments  spouse and daughter in room    Exercises      Shoulder [place] expects to be discharged to:: Private residence Living Arrangements: Spouse/significant other Available Help at Discharge: Family;Available 24 hours/day Type of Home: House Home Access: Stairs to enter [employer] of Steps: 2 steps Entrance Stairs-Rails: None Home Layout: One level;Other (Comment)     Bathroom Shower/Tub: Occupational psychologist: Handicapped height     Home Equipment: Advice worker (2 wheels);Cane - single point;Grab bars - toilet;Grab bars - tub/shower;Other (comment) (has 3 RWs)   Additional Comments: 2 steps to living room and bathroom; door facing hold      Prior Functioning/Environment Prior Level of Function : Needs assist  Cognitive Assist : ADLs (cognitive);Mobility (cognitive) Mobility (Cognitive): Set up cues ADLs (Cognitive): Set up cues Physical Assist : ADLs (physical)   ADLs (physical): Dressing;Bathing Mobility Comments: falls, 2 steps to get around in home ADLs Comments: Pt's spouse was assist with LB ADL due to increased swelling and imbalance when leaving over.        OT Problem List: Decreased strength;Decreased activity tolerance;Impaired balance (sitting and/or standing);Decreased coordination;Decreased cognition;Decreased safety awareness;Pain;Increased edema;Cardiopulmonary status limiting activity;Decreased knowledge of use of DME or AE      OT Treatment/Interventions: Self-care/ADL training;Therapeutic exercise;Energy conservation;Neuromuscular education;Therapeutic activities;Cognitive remediation/compensation;Patient/family education;Balance training;Manual therapy;DME and/or AE instruction    OT Goals(Current goals can be found in the care plan section) Acute Rehab OT Goals Patient Stated Goal: to go the CIR OT Goal Formulation: With patient/family Time For Goal Achievement: 06/26/21 Potential to Achieve Goals: Good ADL Goals Pt Will  Transfer to Toilet: with min assist;stand pivot transfer;bedside commode Pt/caregiver will Perform Home Exercise Program: Increased strength;Both right and left upper extremity;With minimal assist;With written HEP provided;With theraband Additional ADL Goal #1: Pt will increase to x6 mins of OOB ADL tasks in sitting with set-upA.  OT Frequency: Min 2X/week  Barriers to D/C:            Co-evaluation              AM-PAC OT "6 Clicks" Daily Activity     Outcome Measure Help from another person eating meals?: A Little Help from another person taking care of personal grooming?: A Little Help from another person toileting, which includes using toliet, bedpan, or urinal?: A Lot Help from another person bathing (including washing, rinsing, drying)?: A Lot Help from another person to put on and taking off regular upper body clothing?: A Little Help from another person to put on and taking off regular lower body clothing?: A Lot 6 Click Score: 15   End of Session Equipment Utilized During Treatment: Rolling walker (2 wheels);Gait belt Nurse Communication: Mobility status  Activity Tolerance: Patient tolerated treatment well Patient left: in chair;with call bell/phone within reach;with family/visitor present;with SCD's reapplied  OT Visit Diagnosis: Unsteadiness on feet (R26.81);Muscle weakness (generalized) (M62.81);Pain;Other symptoms and signs involving cognitive function                Time: 1035-1109 OT Time Calculation (min): 34 min Charges:  OT General Charges $OT Visit: 1 Visit OT Evaluation $OT Eval Moderate Complexity: 1 Mod  Jefferey Pica, OTR/L Acute Rehabilitation Services Pager: [phone] Office: [phone]   Traniece Boffa C 06/12/2021, 2:04 PM

## 2021-06-12 NOTE — Progress Notes (Signed)
ANTICOAGULATION CONSULT NOTE - Follow Up Consult  Pharmacy Consult for IV Heparin + warfarin Indication: pulmonary embolus  Allergies  Allergen Reactions   Azithromycin Nausea And Vomiting   Levofloxacin Diarrhea    Patient Measurements: Height: 6\' 3"  (190.5 cm) Weight: 108.9 kg (240 lb) IBW/kg (Calculated) : 84.5 Heparin Dosing Weight: 106.6 kg  Vital Signs: Temp: 96.1 F (35.6 C) (12/11 0754) Temp Source: Axillary (12/11 0754) BP: 102/66 (12/11 1300) Pulse Rate: 93 (12/11 1100)  Labs: Recent Labs    06/18/2021 1525 06/10/21 0713 06/10/21 0713 06/10/21 0905 06/10/21 2234 06/11/21 1034 06/11/21 1951 06/12/21 0312 06/12/21 1229  HGB  --  12.7*   < > 13.3  --  10.7*  --  10.9*  --   HCT  --  43.8   < > 39.0  --  34.8*  --  36.8*  --   PLT  --  279  --   --   --  230  --  235  --   LABPROT  --  18.0*  --   --   --   --   --   --   --   INR  --  1.5*  --   --   --   --   --   --   --   HEPARINUNFRC  --   --   --   --    < > 0.22* 0.15* 0.22* 0.30  CREATININE  --  0.78  --   --   --   --   --  0.71  --   TROPONINIHS 67*  --   --   --   --   --   --   --   --    < > = values in this interval not displayed.     Estimated Creatinine Clearance: 90 mL/min (by C-G formula based on SCr of 0.71 mg/dL).   Assessment: 87 years of age male with metastatic prostate cancer receiving radiation and on chronic warfarin therapy for history of DVT and PE who was admitted post fall with new small SDH. Vitamin K 5mg  IV given x1 for INR of 8.8 on admission 12/8. INR trended down to 1.5. CT Angio showing multiple small pulmonary embolisms in segmental and subsegmental right lower lobe. Pharmacy consulted to start IV Heparin 12/9. No surgical intervention of small SDH recommended at this time. CCM made decision to continue with warfarin - consulted Pharmacy to resume 12/11. PTA warfarin dose: 5mg  MWF and 2.5mg  all other days.  Last INR was 1.5 from 12/9. CBC stable.  IV heparin was increase  this morning for subtherapeutic level with recheck pending for this afternoon.  Goal of Therapy:  Heparin level 0.3 to 0.5 units/ml Monitor platelets by anticoagulation protocol: Yes   Plan:  Continue heparin bridge with INR subtherapeutic at 1900 units/hr for now F/u heparin level recheck scheduled for this afternoon Warfarin 5mg  PO x 1 dose at 1600 Monitor daily heparin level/INR/CBC, s/sx bleeding  Arturo Morton, PharmD, BCPS Please check AMION for all [place] contact numbers Clinical Pharmacist 06/12/2021 12:28 PM   ADDENDUM - Heparin level now therapeutic (0.3) at very low end of range after rate increase earlier today. No bleeding issues per discussion with RN; she does state heparin drip was paused for at least 15 minutes around noon while giving patient a bath (level drawn at 1229). Anticipate heparin level would likely have been slightly higher if heparin had not been  paused.  Plan: Continue heparin at 1900 units/hr Confirmatory heparin level with AM labs Warfarin 5mg  PO x 1 dose dose at 1600 Monitor daily heparin level/INR/CBC, s/sx bleeding   Arturo Morton, PharmD, BCPS Please check AMION for all [place] contact numbers Clinical Pharmacist 06/12/2021 2:11 PM

## 2021-06-12 NOTE — Progress Notes (Addendum)
NAME:  Adrian Foster, MRN:  [id number], DOB:  [birth year], LOS: 3 ADMISSION DATE:  06/02/2021, CONSULTATION DATE:  12/8 REFERRING MD:  Dr. Matilde Sprang, CHIEF COMPLAINT:  hypoxic respiratory failure/ PE  History of Present Illness:  Patient is a 87 yo M w/ pertinent pmh of CAD s/p MI, DVT/PE on Coumadin, T2DM, HTN, metastatic prostate cancer w/ mets to lungs on active radiation presents to [facility] on 12/8 after mechanical fall the night prior on coumadin oncologist recommended he come to ED.  On arrival to [facility] ED on 12/8, patient was found to be hypoxic in 70s on room air. Sats improved with supplemental O2. Patient has been taking Coumadin for subsegmental PE outpatient. INR was 8.8. CT chest shows PE within segmental and subsegmental branches on RLL. CT head showed 5 mm parafalcine subdural hematoma. Neurosurgery consulted and recommend no surgery and recommend not severe enough to reverse anticoagulation. Vitamin K given to improve INR to allow [place] to start patient on heparin drip. CXR shows possible RLL pneumonia; given ceftriaxone/azithromycin.  PCCM consulted for ICU admission and medical management.    Pertinent  Medical History   Past Medical History:  Diagnosis Date   Diabetes mellitus without complication ([place])    DVT (deep venous thrombosis) ([place])    Hypertension    MI (myocardial infarction) ([place])    Pulmonary embolism ([place])      Significant Hospital Events: Including procedures, antibiotic start and stop dates in addition to other pertinent events   12/8: admitted to [facility] on 12/8 for hypoxia 12/9 early morning intubated for encephalopathy and hypoxemic/hypercapnic respiratory failure 06/11/2021 extubating  Interim History / Subjective:  Extubated and stable  Objective   Blood pressure 113/65, pulse 79, temperature (!) 96.1 F (35.6 C), temperature source Axillary, resp. rate (!) 22, height 6\' 3"  (1.905 m), weight 108.9 kg, SpO2 97 %.    Vent Mode: PSV;CPAP FiO2 (%):  [30 %] 30  % PEEP:  [5 cmH20] 5 cmH20 Pressure Support:  [10 cmH20] 10 cmH20   Intake/Output Summary (Last 24 hours) at 06/12/2021 0929 Last data filed at 06/12/2021 0800 Gross per 24 hour  Intake 2610.91 ml  Output 1550 ml  Net 1060.91 ml   Filed Weights   06/30/2021 1241  Weight: 108.9 kg    Examination: General: Elderly male no acute distress follows commands HEENT: MM pink/moist no JVD is appreciated  neuro: Somewhat lethargic but awake alert follows commands CV: Heart sounds are regular PULM: Diminished breath sounds in the bases GU Extremities: warm/dry, 3+ left foot 1+ right foot edema  Skin: no rashes or lesions     Recent Labs  Lab 06/26/2021 1303 06/10/21 0713 06/10/21 0905 06/12/21 0312  NA 133* 135 134* 137  K 4.7 4.4 4.1 3.2*  CL 89* 91*  --  94*  CO2 35* 37*  --  35*  BUN 26* 20  --  14  CREATININE 0.78 0.78  --  0.71  GLUCOSE 161* 120*  --  100*   Recent Labs  Lab 06/10/21 0713 06/10/21 0905 06/11/21 1034 06/12/21 0312  HGB 12.7* 13.3 10.7* 10.9*  HCT 43.8 39.0 34.8* 36.8*  WBC 6.9  --  7.0 6.7  PLT 279  --  230 235      Assessment & Plan:   Acute metabolic encephalopathy  Awake more alert resolved    Acute hypoxemia and  hypercapnic respiratory failure Community Acquired Pneumonia Segmental/subsegmental RLL PE:    Transition to oral agent for anticoagulation(pharmacy coumadin) Continue  antimicrobial therapy Transition to stepdown unit to [facility] hospitalist service Change abx to cefepime       Small SDH: Ct head 12/8 5 mm parafalcine subdural hematoma  P:  MRIs noted Blood pressure control   -  Metastatic prostate cancer w/ mets to lungs: on active radiation Right ischial metastatic lesion: seen on CT 12/8 P: Supportive care Hypovolemic Hyponatremia Recent Labs  Lab 06/10/21 0713 06/10/21 0905 06/12/21 0312  NA 135 134* 137    P: Resolved  DMT2 CBG (last 3)  Recent Labs    06/12/21 0047 06/12/21 0438  06/12/21 0726  GLUCAP 114* 93 94    P: Sliding-scale insulin protocol Transition back to oral med's.  HTN HLD Hx of MI P: Resume home blood pressure medications  and ASA    Possible ileus vs. Enteritis P: Swallowing evaluation Reg diet   Indeterminate right renal lesion P: MRI as noted   Best Practice (right click and "Reselect all SmartList Selections" daily)   Diet/type: Regular consistency (see orders) and NPO w/ oral meds DVT prophylaxis: other; will start on heparin when INR therapeutic level GI prophylaxis: PPI Lines: N/A Foley:  N/A Code Status:  full code Last date of multidisciplinary goals of care discussion [discussed with wife at bedside. ]    Critical Care Time: App cct 20 min   Richardson Landry Minor ACNP Acute Care Nurse Practitioner [place] Please consult [place] 06/12/2021, 9:29 AM   Attending Note:  I have examined patient, reviewed labs, studies and notes.   Interval: -Extubated successfully yesterday 12/10 -Has not had a bowel movement -Tolerating p.o. diet without any evidence of dysphagia  Vitals:   06/12/21 0800 06/12/21 0820 06/12/21 1100 06/12/21 1300  BP: (!) 95/35 113/65 111/73 102/66  Pulse: 86 79 93   Resp: (!) 22  (!) 25 14  Temp:      TempSrc:      SpO2: 99% 97% 94%   Weight:      Height:      Elderly man, ill-appearing.  Awake, interacts, follows commands.  Oropharynx clear, strong voice, no stridor or secretions.  Lungs are clear bilaterally.  He is coughing some blood-tinged mucus, clears without difficulty.  Heart regular without a murmur.  Abdomen benign.  1+ lower extremity edema.  Acute respiratory failure due to acute encephalopathy.  Resolved.  Push pulmonary hygiene.  He is working with flutter valve and I-S.  He has some blood-tinged secretions that are clearing.  Continue to follow.  May need further work-up if persistent.  This is on anticoagulation for his history of PE, metastatic prostate  cancer to the lung.  History of pulmonary embolism.  He is on heparin infusion.  I do not think he is at Coumadin failure although his dosing has been complicated.  He may benefit from changing to an alternative.  Small SDH, no indication to stop anticoagulation as per neurosurgery recommendations.  Constipation/possible ileus.  He is tolerating p.o.  We will ramp up his bowel regimen again today.  Disposition.  He can transition out of the ICU and to [facility] as of 12/12.     Baltazar Apo, MD, PhD 06/12/2021, 4:49 PM [facility] [phone] or if no answer [phone]

## 2021-06-12 NOTE — Progress Notes (Signed)
ANTICOAGULATION CONSULT NOTE - Follow Up Consult  Pharmacy Consult for IV Heparin + warfarin Indication: pulmonary embolus  Allergies  Allergen Reactions   Azithromycin Nausea And Vomiting   Levofloxacin Diarrhea    Patient Measurements: Height: 6\' 3"  (190.5 cm) Weight: 108.9 kg (240 lb) IBW/kg (Calculated) : 84.5 Heparin Dosing Weight: 106.6 kg  Vital Signs: Temp: 96.1 F (35.6 C) (12/11 0754) Temp Source: Axillary (12/11 0754) BP: 113/65 (12/11 0820) Pulse Rate: 79 (12/11 0820)  Labs: Recent Labs    06/17/2021 1303 06/18/2021 1525 06/10/21 0713 06/10/21 0905 06/10/21 2234 06/11/21 1034 06/11/21 1951 06/12/21 0312  HGB 12.7*  --  12.7* 13.3  --  10.7*  --  10.9*  HCT 41.1  --  43.8 39.0  --  34.8*  --  36.8*  PLT 333  --  279  --   --  230  --  235  LABPROT 72.4*  --  18.0*  --   --   --   --   --   INR 8.8*  --  1.5*  --   --   --   --   --   HEPARINUNFRC  --   --   --   --    < > 0.22* 0.15* 0.22*  CREATININE 0.78  --  0.78  --   --   --   --  0.71  TROPONINIHS 91* 67*  --   --   --   --   --   --    < > = values in this interval not displayed.     Estimated Creatinine Clearance: 90 mL/min (by C-G formula based on SCr of 0.71 mg/dL).   Assessment: 87 years of age male with metastatic prostate cancer receiving radiation and on chronic warfarin therapy for history of DVT and PE who was admitted post fall with new small SDH. Vitamin K 5mg  IV given x1 for INR of 8.8 on admission 12/8. INR trended down to 1.5. CT Angio showing multiple small pulmonary embolisms in segmental and subsegmental right lower lobe. Pharmacy consulted to start IV Heparin 12/9. No surgical intervention of small SDH recommended at this time. CCM made decision to continue with warfarin - consulted Pharmacy to resume 12/11. PTA warfarin dose: 5mg  MWF and 2.5mg  all other days.  Last INR was 1.5 from 12/9. CBC stable.  IV heparin was increase this morning for subtherapeutic level with recheck  pending for this afternoon.  Goal of Therapy:  Heparin level 0.3 to 0.5 units/ml Monitor platelets by anticoagulation protocol: Yes   Plan:  Continue heparin bridge with INR subtherapeutic at 1900 units/hr for now F/u heparin level recheck scheduled for this afternoon Warfarin 5mg  PO x 1 dose at 1600 Monitor daily heparin level/INR/CBC, s/sx bleeding   Arturo Morton, PharmD, BCPS Please check AMION for all [place] contact numbers Clinical Pharmacist 06/12/2021 12:28 PM

## 2021-06-13 ENCOUNTER — Inpatient Hospital Stay (HOSPITAL_COMMUNITY): Payer: Medicare Other

## 2021-06-13 ENCOUNTER — Other Ambulatory Visit (HOSPITAL_COMMUNITY): Payer: Self-pay

## 2021-06-13 DIAGNOSIS — E43 Unspecified severe protein-calorie malnutrition: Secondary | ICD-10-CM

## 2021-06-13 DIAGNOSIS — G253 Myoclonus: Secondary | ICD-10-CM

## 2021-06-13 LAB — CBC
HCT: 35.7 % — ABNORMAL LOW (ref 39.0–52.0)
Hemoglobin: 10.7 g/dL — ABNORMAL LOW (ref 13.0–17.0)
MCH: 29.8 pg (ref 26.0–34.0)
MCHC: 30 g/dL (ref 30.0–36.0)
MCV: 99.4 fL (ref 80.0–100.0)
Platelets: 210 10*3/uL (ref 150–400)
RBC: 3.59 MIL/uL — ABNORMAL LOW (ref 4.22–5.81)
RDW: 16.5 % — ABNORMAL HIGH (ref 11.5–15.5)
WBC: 5.6 10*3/uL (ref 4.0–10.5)
nRBC: 0 % (ref 0.0–0.2)

## 2021-06-13 LAB — GLUCOSE, CAPILLARY
Glucose-Capillary: 121 mg/dL — ABNORMAL HIGH (ref 70–99)
Glucose-Capillary: 150 mg/dL — ABNORMAL HIGH (ref 70–99)

## 2021-06-13 LAB — BASIC METABOLIC PANEL
Anion gap: 6 (ref 5–15)
BUN: 10 mg/dL (ref 8–23)
CO2: 38 mmol/L — ABNORMAL HIGH (ref 22–32)
Calcium: 8.8 mg/dL — ABNORMAL LOW (ref 8.9–10.3)
Chloride: 95 mmol/L — ABNORMAL LOW (ref 98–111)
Creatinine, Ser: 0.55 mg/dL — ABNORMAL LOW (ref 0.61–1.24)
GFR, Estimated: >60 mL/min (ref >60)
Glucose, Bld: 121 mg/dL — ABNORMAL HIGH (ref 70–99)
Potassium: 3.9 mmol/L (ref 3.5–5.1)
Sodium: 139 mmol/L (ref 135–145)

## 2021-06-13 LAB — PROTIME-INR
INR: 2.5 — ABNORMAL HIGH (ref 0.8–1.2)
Prothrombin Time: 26.6 seconds — ABNORMAL HIGH (ref 11.4–15.2)

## 2021-06-13 LAB — HEPARIN LEVEL (UNFRACTIONATED): Heparin Unfractionated: 0.34 IU/mL (ref 0.30–0.70)

## 2021-06-13 LAB — MAGNESIUM: Magnesium: 1.8 mg/dL (ref 1.7–2.4)

## 2021-06-13 LAB — PHOSPHORUS: Phosphorus: 2.5 mg/dL (ref 2.5–4.6)

## 2021-06-13 IMAGING — DX DG CHEST 1V PORT
1 series · 1 of 1 positions shown · non-contrast
Comparison: [DATE], [DATE]

CLINICAL DATA: 87-year-old male with history of abnormal
respiration.

EXAM:
PORTABLE CHEST - 1 VIEW

[chest]
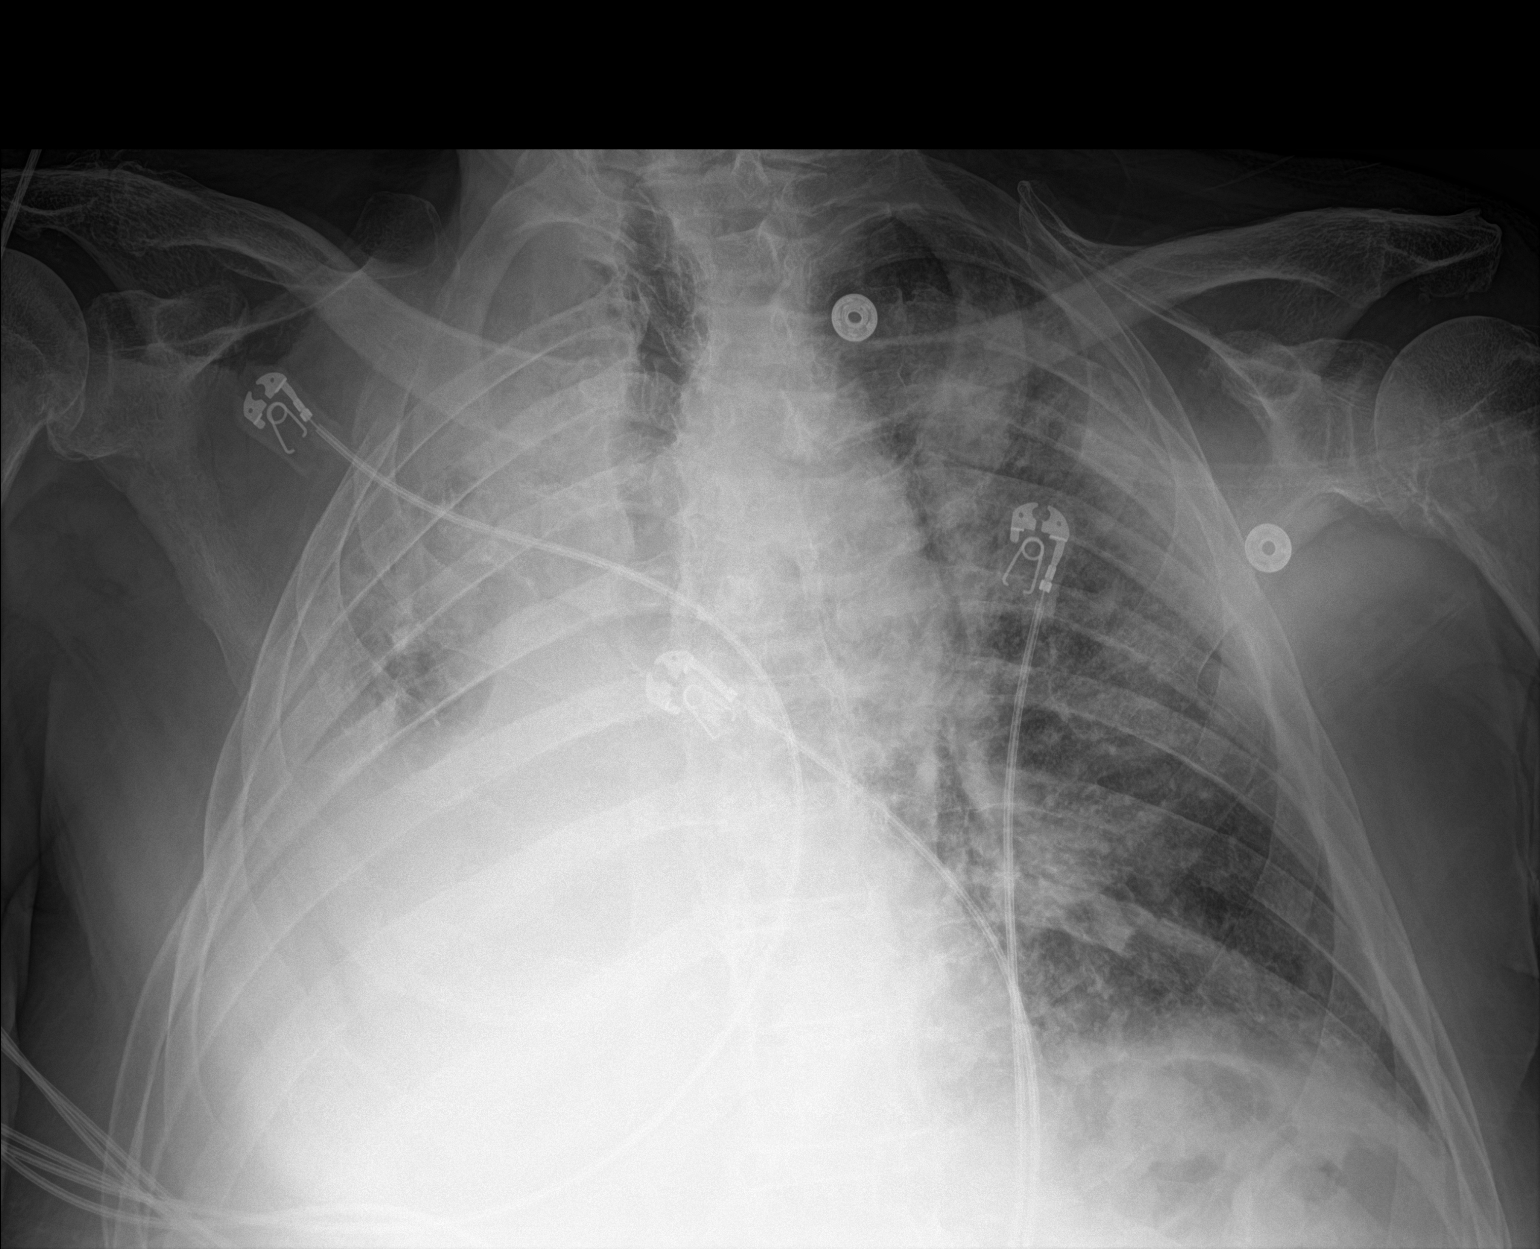

[1 of 1 positions shown; findings below may reference images not displayed]

FINDINGS: The patient is rotated to the right. Similar appearing obscuration
of the right cardiomediastinal silhouette and right costophrenic
angle. Slight interval increased near complete opacification of the
right hemithorax with scattered aeration of the right upper lobe.
Slight interval improvement and scattered hazy opacities most
prominent about the left hilum. No evidence of pneumothorax. Similar
appearing erosive changes of the proximal right second and third
ribs. No acute osseous abnormality.
IMPRESSION: 1. Slight interval worsening of near complete opacification of the
right hemithorax, likely partially due to at least a moderate right
pleural effusion.
2. Slight interval improved aeration of the left lung with scattered
persistent hazy perihilar opacities.

## 2021-06-13 MED ORDER — ALBUTEROL SULFATE (2.5 MG/3ML) 0.083% IN NEBU: 2.5000 mg | INHALATION_SOLUTION | Freq: Four times a day (QID) | RESPIRATORY_TRACT | Status: DC

## 2021-06-13 MED ORDER — LACOSAMIDE 50 MG PO TABS
100.0000 mg | ORAL_TABLET | Freq: Two times a day (BID) | ORAL | Status: DC
  Administered 2021-06-13 – 2021-06-14 (×2): 100 mg via ORAL
  Filled 2021-06-13 (×2): qty 2

## 2021-06-13 MED ORDER — WARFARIN SODIUM 2 MG PO TABS
2.0000 mg | ORAL_TABLET | Freq: Once | ORAL | Status: DC
  Filled 2021-06-13: qty 1

## 2021-06-13 MED ORDER — ALBUTEROL SULFATE (2.5 MG/3ML) 0.083% IN NEBU
2.5000 mg | INHALATION_SOLUTION | RESPIRATORY_TRACT | Status: AC
  Administered 2021-06-13 – 2021-06-15 (×12): 2.5 mg via RESPIRATORY_TRACT
  Filled 2021-06-13 (×12): qty 3

## 2021-06-13 NOTE — Progress Notes (Signed)
LTM EEG hooked up and running - no initial skin breakdown - push button tested - neuro notified. [facility] monitoring.  

## 2021-06-13 NOTE — Progress Notes (Signed)
RT on unit getting a scheduled breathing treatment for this patient when the RN called for RT to access the patient due to desat.  Patient was on 4L [place] sats low 80's.  RT placed patient on Albuterol neb treatment at 9L, sats improved to 98%.  Patient has rhonchi BS and able to move air.  Post neb treatment patient was placed on a [place] HFNC at 7L with sats 94-96%.  Rapid Response RN came to assess patient and assisted with placing patient on chest vest therapy. CPT by chest vest performed for 10 minutes. Patient tolerated well, coughed upon request and a small amount of clear/tan secretions where suctioned out with a Yankauer. RT will continue to monitor.

## 2021-06-13 NOTE — Progress Notes (Signed)
NAME:  Adrian Foster, MRN:  [id number], DOB:  05/07/1934, LOS: 4 ADMISSION DATE:  06/06/2021, CONSULTATION DATE:  06/13/2021 REFERRING MD:  Dr. [place] Pink, CHIEF COMPLAINT:  Pleural Effusion   History of Present Illness:  87 year old male presents to [facility] on 12/8 after mechanical fall the night prior on coumadin oncologist recommended he come to ED.   On arrival to [facility] ED on 12/8, patient was found to be hypoxic in 70s on room air. Sats improved with supplemental O2. Patient has been taking Coumadin for subsegmental PE outpatient. INR was 8.8. CT chest shows PE within segmental and subsegmental branches on RLL. CT head showed 5 mm parafalcine subdural hematoma. Neurosurgery consulted and recommend no surgery and recommend not severe enough to reverse anticoagulation. Vitamin K given to improve INR to allow [place] to start patient on heparin drip. CXR shows possible RLL pneumonia; given ceftriaxone/azithromycin. 12/9 intubated for encephalopathy and hypoxemic/hypercapnic respiratory failure. Extubated 12/10.  Patient transferred out of ICU 12/11. 12/12 pulmonary consulted for evaluation of worsening right hemithorax with near complete opacification.   Pertinent  Medical History  CAD s/p MI, DVT/PE on Coumadin, T2DM, HTN, metastatic prostate cancer w/ mets to lungs on active radiation  Significant Hospital Events: Including procedures, antibiotic start and stop dates in addition to other pertinent events   12/8: admitted to [facility] on 12/8 for hypoxia 12/9 early morning intubated for encephalopathy and hypoxemic/hypercapnic respiratory failure 12/10 extubated   Interim History / Subjective:  Overnight with confusion and agitation.   Objective   Blood pressure 106/64, pulse 96, temperature 98.1 F (36.7 C), temperature source Oral, resp. rate (!) 25, height 6\' 3"  (1.905 m), weight 108.9 kg, SpO2 (!) 86 %.        Intake/Output Summary (Last 24 hours) at 06/13/2021 1216 Last data filed at 06/12/2021  1300 Gross per 24 hour  Intake 672.31 ml  Output 550 ml  Net 122.31 ml   Filed Weights   06/02/2021 1241  Weight: 108.9 kg    Examination: General: ill appearing elderly male, lying in bed  HENT: Dry MM  Lungs: Rhonchi to upper, no use of accessory muscles  Cardiovascular: HR 95, Irregular, no mRG Abdomen: Soft, non-tender, active bowel sounds  Extremities: -edema Neuro: awakes with verbal stimulation, follows commands, oriented GU: external foley cath in place   Resolved Hospital Problem list     Assessment & Plan:   Acute Hypoxic/Hypercarbic Respiratory Failure in setting of CAP, +Segmental/Subsegmental RLL PE +Pseudomonas on tracheal aspirate  With Progressive Opacification of Right Lung, concern for moderate pleural effusion  Concern for Ongoing Aspiration with moderate dysphagia  Plan -Will Perform Bedside U/S to evaluate further >> bedside U/S with no fluid pocket noted, suspect opacification is aspiration, consolidation with PNA -Add scheduled albuterol and Chest PT, if no improvement will add hypertonic saline nebs -Treated with 3 days of Rocephin/Azithromycin, Now on Day 2 of Cefepime  -Speech Continues to follow  -Continues on Heparin gtt   Subdural Hematoma  Concern for Seizure > transient episode of decreased responsiveness and right upper extremity jerking  Plan -Neurosurgery Following  -Neurology Following >> Plan for EEG, continues on Keppra  Best Practice (right click and "Reselect all SmartList Selections" daily)   Remaining management per primary team.   Labs   CBC: Recent Labs  Lab 06/27/2021 1303 06/10/21 0713 06/10/21 0905 06/11/21 1034 06/12/21 0312 06/13/21 0309  WBC 9.9 6.9  --  7.0 6.7 5.6  NEUTROABS 8.9*  --   --   --  6.1  --   HGB 12.7* 12.7* 13.3 10.7* 10.9* 10.7*  HCT 41.1 43.8 39.0 34.8* 36.8* 35.7*  MCV 95.4 100.2*  --  96.1 97.9 99.4  PLT 333 279  --  230 235 678    Basic Metabolic Panel: Recent Labs  Lab 06/08/2021 1303  06/10/21 0713 06/10/21 0905 06/11/21 0756 06/12/21 0312 06/13/21 0309  NA 133* 135 134*  --  137 139  K 4.7 4.4 4.1  --  3.2* 3.9  CL 89* 91*  --   --  94* 95*  CO2 35* 37*  --   --  35* 38*  GLUCOSE 161* 120*  --   --  100* 121*  BUN 26* 20  --   --  14 10  CREATININE 0.78 0.78  --   --  0.71 0.55*  CALCIUM 9.6 9.8  --   --  8.8* 8.8*  MG  --  1.6*  --  2.0 1.8 1.8  PHOS  --  5.4*  --   --  2.8 2.5   GFR: Estimated Creatinine Clearance: 90 mL/min (A) (by C-G formula based on SCr of 0.55 mg/dL (L)). Recent Labs  Lab 06/10/21 0713 06/11/21 1034 06/12/21 0312 06/13/21 0309  WBC 6.9 7.0 6.7 5.6    Liver Function Tests: Recent Labs  Lab 06/16/2021 1303  AST 32  ALT 24  ALKPHOS 125  BILITOT 0.4  PROT 6.8  ALBUMIN 2.1*   No results for input(s): LIPASE, AMYLASE in the last 168 hours. No results for input(s): AMMONIA in the last 168 hours.  ABG    Component Value Date/Time   PHART 7.515 (H) 06/10/2021 0905   PCO2ART 50.8 (H) 06/10/2021 0905   PO2ART 414 (H) 06/10/2021 0905   HCO3 41.2 (H) 06/10/2021 0905   TCO2 43 (H) 06/10/2021 0905   O2SAT 100.0 06/10/2021 0905     Coagulation Profile: Recent Labs  Lab 06/17/2021 1303 06/10/21 0713 06/13/21 0309  INR 8.8* 1.5* 2.5*    Cardiac Enzymes: No results for input(s): CKTOTAL, CKMB, CKMBINDEX, TROPONINI in the last 168 hours.  HbA1C: No results found for: HGBA1C  CBG: Recent Labs  Lab 06/12/21 0438 06/12/21 0726 06/12/21 1125 06/12/21 1621 06/12/21 1700  GLUCAP 93 94 128* 176* 185*    Review of Systems:   +Cough.   Hayden Pedro, AGACNP-BC [facility]  PCCM Pgr: [phone]

## 2021-06-13 NOTE — Progress Notes (Signed)
ANTICOAGULATION CONSULT NOTE - Follow Up Consult  Pharmacy Consult for IV Heparin + warfarin Indication: pulmonary embolus  Allergies  Allergen Reactions   Azithromycin Nausea And Vomiting   Levofloxacin Diarrhea    Patient Measurements: Height: 6\' 3"  (190.5 cm) Weight: 108.9 kg (240 lb) IBW/kg (Calculated) : 84.5 Heparin Dosing Weight: 106.6 kg  Vital Signs: Temp: 97.5 F (36.4 C) (12/11 2300) Temp Source: Axillary (12/11 2300) BP: 102/58 (12/11 2300) Pulse Rate: 89 (12/11 2300)  Labs: Recent Labs    06/11/21 1034 06/11/21 1951 06/12/21 0312 06/12/21 1229 06/13/21 0309  HGB 10.7*  --  10.9*  --  10.7*  HCT 34.8*  --  36.8*  --  35.7*  PLT 230  --  235  --  210  LABPROT  --   --   --   --  26.6*  INR  --   --   --   --  2.5*  HEPARINUNFRC 0.22*   < > 0.22* 0.30 0.34  CREATININE  --   --  0.71  --  0.55*   < > = values in this interval not displayed.     Estimated Creatinine Clearance: 90 mL/min (A) (by C-G formula based on SCr of 0.55 mg/dL (L)).   Assessment: 87 years of age male with metastatic prostate cancer receiving radiation and on chronic warfarin therapy for history of DVT and PE who was admitted post fall with new small SDH. Vitamin K 5mg  IV given x1 for INR of 8.8 on admission 12/8. INR trended down to 1.5. CT Angio showing multiple small pulmonary embolisms in segmental and subsegmental right lower lobe. Pharmacy consulted to start IV Heparin 12/9. No surgical intervention of small SDH recommended at this time. CCM made decision to continue with warfarin - consulted Pharmacy to resume 12/11. PTA warfarin dose: 5mg  MWF and 2.5mg  all other days.  Last INR was 1.5 from 12/9. CBC stable.  IV heparin continued at current rate (19 ml/hr) and warfarin 2 mg po at 1600 will be given. INR 2.5 after a 5 mg dose on 12/11. Normal dosing regimen in mind and prior INR of 1.5 after 5 mg of vitamin K administered; I will give 2 mg and follow-up on INR/HL in the morning  of 12/13  Goal of Therapy:  Heparin level 0.3 to 0.5 units/ml Monitor platelets by anticoagulation protocol: Yes   Plan:  Continue heparin bridge with INR therapeutic at 1900 units/hr for now F/u heparin level recheck scheduled for this afternoon Warfarin 2 mg PO x 1 dose at 1600 Monitor daily heparin level/INR/CBC, s/sx bleeding    Zarek Relph BS, PharmD, BCPS Clinical Pharmacist Please check AMION for all [place] contact numbers Clinical Pharmacist 06/13/2021 8:21 AM

## 2021-06-13 NOTE — Progress Notes (Signed)
[facility] PROGRESS NOTE   Adrian Foster XBJ:[id number] DOB: 22-Oct-1933 DOA: 06/30/2021  PCP: Charleston Poot, MD  Brief History/Interval Summary: Patient is a 87 yo M w/ pertinent pmh of CAD s/p MI, DVT/PE on Coumadin, T2DM, HTN, metastatic prostate cancer w/ mets to lungs on active radiation presents to [facility] on 12/8 after mechanical fall the night prior on coumadin. Oncologist recommended he come to ED.   On arrival to [facility] ED on 12/8, patient was found to be hypoxic in 70s on room air. Sats improved with supplemental O2. Patient has been taking Coumadin for subsegmental PE outpatient. INR was 8.8. CT chest shows PE within segmental and subsegmental branches on RLL. CT head showed 5 mm parafalcine subdural hematoma. Neurosurgery consulted and recommend no surgery and recommend not severe enough to reverse anticoagulation. Vitamin K given to improve INR to allow [place] to start patient on heparin drip. CXR shows possible RLL pneumonia; given ceftriaxone/azithromycin. On 12/9 patient had worsening encephalopathy and hypoxemia requiring intubation. Extubated on 12/10.   Consultants: Critical care medicine.  Neurosurgery.  Procedures: Intubation followed by extubation the following day    Subjective/Interval History: Patient noted to be somewhat distracted this morning.  His wife is at the bedside.  Patient denies any headache.  No chest pain.  Some difficulty breathing has been present but no worse compared to the last 2 days.     Assessment/Plan:  Acute metabolic encephalopathy This was in the setting of hypoxemia and subdural hematoma.  Appears to have improved.  Still remains distracted however. Reorient daily.  Acute respiratory failure with hypoxia and hypercapnia Was on mechanical ventilation.  Extubated on 12/10.  Seems to be stable.  Currently on 3 L of oxygen by nasal cannula. Patient also with history of metastatic prostate cancer with lung metastasis which is also  contributing. Chest x-ray done this morning shows near complete opacification of the right hemithorax with scattered aeration of the right upper lobe.  A moderate pleural effusion is suspected. CT angiogram chest done on 12/8 that showed diffuse multifocal airspace opacity bilaterally.  Patchy right lower lobe airspace disease was also noted at that time.  There was a trace right pleural effusion at that time. It would be reasonable to attempt a thoracentesis.  Since he was seen by pulmonology up until yesterday will discuss with them.  Community-acquired pneumonia Pseudomonas noted on tracheal aspirate cultures.  Remains on antibiotics.  Noted to be on cefepime.  Segmental right lower lobe pulmonary embolism/previous history of DVT and PE Was on warfarin prior to admission.  Currently on heparin and warfarin.  Subdural hematoma was thought to be a small and it was okay to continue with anticoagulation based on critical care notes.  Subdural hematoma This was noted to be small in size.  Discussed with neurosurgery.  No intervention was necessary.  He was cleared to take anticoagulation. Was also started on Keppra for seizure prophylaxis.  Normocytic anemia No evidence of overt blood loss.  Continue to monitor.  History of metastatic prostate cancer with mets to lungs Gets treatment at [facility].  Has been getting radiation treatments.  Can follow-up with his oncologist at discharge.  Hyponatremia Resolved  Diabetes mellitus type 2, controlled On metformin prior to admission.  Continue SSI.  Essential hypertension Monitor blood pressures closely.  Noted to be on torsemide and metolazone prior to admission.  No other antihypertensive listed.  Dilated small bowel loops Noted incidentally on CT scan.  Abdomen is benign.  Continue  to monitor.  Indeterminate right renal lesion Will need outpatient MRI for same.  Severe protein calorie malnutrition Nutrition Problem: Severe  Malnutrition Etiology: chronic illness, cancer and cancer related treatments  Signs/Symptoms: severe muscle depletion, severe fat depletion  Interventions: Hormel Shake, MVI, Liberalize Diet   DVT Prophylaxis: On heparin and Coumadin Code Status: Full code Family Communication: Discussed with patient's wife Disposition Plan: Possibly to inpatient rehabilitation when medically stable  Status is: Inpatient  Remains inpatient appropriate because: Acute respiratory failure with hypoxia, acute encephalopathy     Medications: Scheduled:  aspirin EC  81 mg Oral Daily   Chlorhexidine Gluconate Cloth  6 each Topical Q0600   docusate sodium  100 mg Oral BID   melatonin  5 mg Oral Once   multivitamin with minerals  1 tablet Oral Daily   pantoprazole  40 mg Oral Daily   polyethylene glycol  17 g Oral Daily   warfarin  2 mg Oral ONCE-1600   Warfarin - Pharmacist Dosing Inpatient   Does not apply q1600   Continuous:  sodium chloride Stopped (06/10/2021 1831)   ceFEPime (MAXIPIME) IV 2 g (06/13/21 0418)   heparin 1,900 Units/hr (06/12/21 1106)   levETIRAcetam 500 mg (06/13/21 0630)   WUJ:WJXBJY chloride  Antibiotics: Anti-infectives (From admission, onward)    Start     Dose/Rate Route Frequency Ordered Stop   06/12/21 1200  ceFEPIme (MAXIPIME) 2 g in sodium chloride 0.9 % 100 mL IVPB        2 g 200 mL/hr over 30 Minutes Intravenous Every 8 hours 06/12/21 1113     06/11/21 1200  cefTRIAXone (ROCEPHIN) 2 g in sodium chloride 0.9 % 100 mL IVPB  Status:  Discontinued        2 g 200 mL/hr over 30 Minutes Intravenous Every 24 hours 06/11/21 1025 06/12/21 1113   06/10/21 1600  cefTRIAXone (ROCEPHIN) 1 g in sodium chloride 0.9 % 100 mL IVPB  Status:  Discontinued        1 g 200 mL/hr over 30 Minutes Intravenous Every 24 hours 06/10/21 1025 06/11/21 1025   06/10/21 1600  azithromycin (ZITHROMAX) 500 mg in sodium chloride 0.9 % 250 mL IVPB  Status:  Discontinued        500 mg 250 mL/hr  over 60 Minutes Intravenous Every 24 hours 06/10/21 1025 06/12/21 1113   06/06/2021 1615  cefTRIAXone (ROCEPHIN) 1 g in sodium chloride 0.9 % 100 mL IVPB        1 g 200 mL/hr over 30 Minutes Intravenous  Once 06/17/2021 1605 06/03/2021 1716   06/08/2021 1615  azithromycin (ZITHROMAX) 500 mg in sodium chloride 0.9 % 250 mL IVPB        500 mg 250 mL/hr over 60 Minutes Intravenous  Once 06/25/2021 1605 06/05/2021 1750       Objective:  Vital Signs  Vitals:   06/12/21 1300 06/12/21 1700 06/12/21 1900 06/12/21 2300  BP: 102/66 106/64 102/65 (!) 102/58  Pulse:  91  89  Resp: 14   20  Temp:  (!) 97.3 F (36.3 C)  (!) 97.5 F (36.4 C)  TempSrc:  Axillary  Axillary  SpO2:  96% 94% 100%  Weight:      Height:        Intake/Output Summary (Last 24 hours) at 06/13/2021 1058 Last data filed at 06/12/2021 1300 Gross per 24 hour  Intake 672.31 ml  Output 550 ml  Net 122.31 ml   Filed Weights   06/07/2021 1241  Weight:  108.9 kg    General appearance: Awake alert.  In no distress.  Mildly distracted Resp: Mildly tachypneic without any use of accessory muscles.  Diminished air entry at the right base.  No wheezing or rhonchi. Cardio: S1-S2 is normal regular.  No S3-S4.  No rubs murmurs or bruit GI: Abdomen is soft.  Nontender nondistended.  Bowel sounds are present normal.  No masses organomegaly Extremities: No edema.  Generalized deconditioning is noted Neurologic:   No focal neurological deficits.    Lab Results:  Data Reviewed: I have personally reviewed following labs and imaging studies  CBC: Recent Labs  Lab 06/15/2021 1303 06/10/21 0713 06/10/21 0905 06/11/21 1034 06/12/21 0312 06/13/21 0309  WBC 9.9 6.9  --  7.0 6.7 5.6  NEUTROABS 8.9*  --   --   --  6.1  --   HGB 12.7* 12.7* 13.3 10.7* 10.9* 10.7*  HCT 41.1 43.8 39.0 34.8* 36.8* 35.7*  MCV 95.4 100.2*  --  96.1 97.9 99.4  PLT 333 279  --  230 235 025    Basic Metabolic Panel: Recent Labs  Lab 06/15/2021 1303  06/10/21 0713 06/10/21 0905 06/11/21 0756 06/12/21 0312 06/13/21 0309  NA 133* 135 134*  --  137 139  K 4.7 4.4 4.1  --  3.2* 3.9  CL 89* 91*  --   --  94* 95*  CO2 35* 37*  --   --  35* 38*  GLUCOSE 161* 120*  --   --  100* 121*  BUN 26* 20  --   --  14 10  CREATININE 0.78 0.78  --   --  0.71 0.55*  CALCIUM 9.6 9.8  --   --  8.8* 8.8*  MG  --  1.6*  --  2.0 1.8 1.8  PHOS  --  5.4*  --   --  2.8 2.5    GFR: Estimated Creatinine Clearance: 90 mL/min (A) (by C-G formula based on SCr of 0.55 mg/dL (L)).  Liver Function Tests: Recent Labs  Lab 06/10/2021 1303  AST 32  ALT 24  ALKPHOS 125  BILITOT 0.4  PROT 6.8  ALBUMIN 2.1*    Coagulation Profile: Recent Labs  Lab 06/20/2021 1303 06/10/21 0713 06/13/21 0309  INR 8.8* 1.5* 2.5*    CBG: Recent Labs  Lab 06/12/21 0438 06/12/21 0726 06/12/21 1125 06/12/21 1621 06/12/21 1700  GLUCAP 93 94 128* 176* 185*     Recent Results (from the past 240 hour(s))  Resp Panel by RT-PCR (Flu A&B, Covid) Nasopharyngeal Swab     Status: None   Collection Time: 06/27/2021  4:54 PM   Specimen: Nasopharyngeal Swab; Nasopharyngeal(NP) swabs in vial transport medium  Result Value Ref Range Status   SARS Coronavirus 2 by RT PCR NEGATIVE NEGATIVE Final    Comment: (NOTE) SARS-CoV-2 target nucleic acids are NOT DETECTED.  The SARS-CoV-2 RNA is generally detectable in upper respiratory specimens during the acute phase of infection. The lowest concentration of SARS-CoV-2 viral copies this assay can detect is 138 copies/mL. A negative result does not preclude SARS-Cov-2 infection and should not be used as the sole basis for treatment or other patient management decisions. A negative result may occur with  improper specimen collection/handling, submission of specimen other than nasopharyngeal swab, presence of viral mutation(s) within the areas targeted by this assay, and inadequate number of viral copies(<138 copies/mL). A negative result  must be combined with clinical observations, patient history, and epidemiological information. The expected result is Negative.  Fact Sheet for Patients:  EntrepreneurPulse.com.au  Fact Sheet for Healthcare Providers:  IncredibleEmployment.be  This test is no t yet approved or cleared by the [place] FDA and  has been authorized for detection and/or diagnosis of SARS-CoV-2 by FDA under an Emergency Use Authorization (EUA). This EUA will remain  in effect (meaning this test can be used) for the duration of the COVID-19 declaration under Section 564(b)(1) of the Act, 21 U.S.C.section 360bbb-3(b)(1), unless the authorization is terminated  or revoked sooner.       Influenza A by PCR NEGATIVE NEGATIVE Final   Influenza B by PCR NEGATIVE NEGATIVE Final    Comment: (NOTE) The Xpert Xpress SARS-CoV-2/FLU/RSV plus assay is intended as an aid in the diagnosis of influenza from Nasopharyngeal swab specimens and should not be used as a sole basis for treatment. Nasal washings and aspirates are unacceptable for Xpert Xpress SARS-CoV-2/FLU/RSV testing.  Fact Sheet for Patients: EntrepreneurPulse.com.au  Fact Sheet for Healthcare Providers: IncredibleEmployment.be  This test is not yet approved or cleared by the [place] FDA and has been authorized for detection and/or diagnosis of SARS-CoV-2 by FDA under an Emergency Use Authorization (EUA). This EUA will remain in effect (meaning this test can be used) for the duration of the COVID-19 declaration under Section 564(b)(1) of the Act, 21 U.S.C. section 360bbb-3(b)(1), unless the authorization is terminated or revoked.  Performed at [facility], [address] [place], [place] [address]   Culture, Respiratory w Gram Stain     Status: None (Preliminary result)   Collection Time: 06/10/21 10:52 AM   Specimen: Tracheal Aspirate; Respiratory   Result Value Ref Range Status   Specimen Description TRACHEAL ASPIRATE  Final   Special Requests NONE  Final   Gram Stain   Final    MODERATE WBC PRESENT,BOTH PMN AND MONONUCLEAR RARE GRAM POSITIVE COCCI FEW GRAM NEGATIVE RODS    Culture   Final    RARE PSEUDOMONAS AERUGINOSA CULTURE REINCUBATED FOR BETTER GROWTH Performed at [facility], [place] [address] [place], [place] [address]    Report Status PENDING  Incomplete      Radiology Studies: DG Chest Port 1 View  Result Date: 06/13/2021 CLINICAL DATA:  87 year old male with history of abnormal respiration. EXAM: PORTABLE CHEST - 1 VIEW COMPARISON:  06/12/2021, 06/11/2021 FINDINGS: The patient is rotated to the right. Similar appearing obscuration of the right cardiomediastinal silhouette and right costophrenic angle. Slight interval increased near complete opacification of the right hemithorax with scattered aeration of the right upper lobe. Slight interval improvement and scattered hazy opacities most prominent about the left hilum. No evidence of pneumothorax. Similar appearing erosive changes of the proximal right second and third ribs. No acute osseous abnormality. IMPRESSION: 1. Slight interval worsening of near complete opacification of the right hemithorax, likely partially due to at least a moderate right pleural effusion. 2. Slight interval improved aeration of the left lung with scattered persistent hazy perihilar opacities. Electronically Signed   By: Ruthann Cancer M.D.   On: 06/13/2021 08:05   DG Chest Port 1 View  Result Date: 06/12/2021 CLINICAL DATA:  Abnormal respiration EXAM: PORTABLE CHEST 1 VIEW COMPARISON:  Prior chest x-ray 06/11/2021 FINDINGS: The patient has been extubated. Lower lung volumes, particularly on the right with near-total atelectasis and/or opacification of the right lung. Extensive bilateral upper lung predominant irregular and interstitial airspace opacities. Right apical mass with destruction  of the right second and third ribs again noted. No pneumothorax.  IMPRESSION: Significantly diminished aeration, particularly on the right following extubation. Diffuse bilateral interstitial and airspace opacities with upper lung predominance again noted concerning for multifocal pneumonia. Right apical mass with osseous destruction of the posterolateral aspects of the right second and third rib again noted. Electronically Signed   By: Jacqulynn Cadet M.D.   On: 06/12/2021 07:57   DG Swallowing Func-Speech Pathology  Result Date: 06/13/2021 Table formatting from the original result was not included. Objective Swallowing Evaluation: Type of Study: MBS-Modified Barium Swallow Study  Patient Details Name: Larico Dimock MRN: [id number] Date of Birth: 07/22/1933 Today's Date: 06/13/2021 Time: SLP Start Time (ACUTE ONLY): 0845 -SLP Stop Time (ACUTE ONLY): 0905 SLP Time Calculation (min) (ACUTE ONLY): 20 min Past Medical History: Past Medical History: Diagnosis Date  Diabetes mellitus without complication ([place])   DVT (deep venous thrombosis) ([facility])   Hypertension   MI (myocardial infarction) ([place])   Pulmonary embolism ([place])  Past Surgical History: Past Surgical History: Procedure Laterality Date  CHOLECYSTECTOMY    REPLACEMENT TOTAL KNEE BILATERAL    VENA CAVA FILTER PLACEMENT   HPI: Patient is a 87 yo M w/ pertinent pmh of CAD s/p MI, DVT/PE on Coumadin, T2DM, HTN, metastatic prostate cancer w/ mets to lungs on active radiation presents to [facility] on 12/8 after mechanical fall.  CT head showed 5 mm parafalcine subdural hematoma. CXR shows possible RLL pneumonia; given ceftriaxone/azithromycin. Rn has observed pt to cough with large sips of water.  No data recorded  Recommendations for follow up therapy are one component of a multi-disciplinary discharge planning process, led by the attending physician.  Recommendations may be updated based on patient status, additional functional criteria and insurance authorization.  Assessment / Plan / Recommendation Clinical Impressions 06/13/2021 Clinical Impression Pt demonstrates a moderate dysphagia, appearing to be exacerbated by lethargy and weakness.Oral phase is adequate, but there is a slight delay in swallow initiation and full airway protection as well as weakness of base of tongue. Initially pt tolerated straw sips of thin well, but one large sip resulted in spillage into the airway with weak cough response that pt never fully cleared and subsequent attempts to regulate bolus size still resulted in further penetration events that contributed to barium in the vestibule and airway. Nectar thick liquids given in smaller sips did not result in penetration or aspiration, though vallecular residual with nectar and solids were present. A cued effortful swallow or second swallow benefitted pt. Recommend regular solids and nectar thick liquids during rehabilitation. Pharyngeal strengthening and respiratory muscle strength training for more effective cough may be beneficial as well as behavioral strategies to limit to single cup sips. Recommend AIR at d/c for intensive rehabiltation and eventual diet upgrade. SLP Visit Diagnosis Dysphagia, pharyngeal phase (R13.13) Attention and concentration deficit following -- Frontal lobe and executive function deficit following -- Impact on safety and function Moderate aspiration risk   Treatment Recommendations 06/13/2021 Treatment Recommendations Therapy as outlined in treatment plan below   Prognosis 06/13/2021 Prognosis for Safe Diet Advancement Good Barriers to Reach Goals Cognitive deficits Barriers/Prognosis Comment -- Diet Recommendations 06/13/2021 SLP Diet Recommendations Regular solids;Nectar thick liquid Liquid Administration via Cup;No straw Medication Administration Whole meds with puree Compensations Slow rate;Small sips/bites;Effortful swallow;Multiple dry swallows after each bite/sip Postural Changes Remain semi-upright after after  feeds/meals (Comment);Seated upright at 90 degrees   Other Recommendations 06/13/2021 Recommended Consults -- Oral Care Recommendations Oral care BID Other Recommendations -- Follow Up Recommendations Acute inpatient rehab (3hours/day) Assistance recommended at discharge Frequent or  constant Supervision/Assistance Functional Status Assessment Patient has had a recent decline in their functional status and demonstrates the ability to make significant improvements in function in a reasonable and predictable amount of time. Frequency and Duration  06/13/2021 Speech Therapy Frequency (ACUTE ONLY) min 2x/week Treatment Duration 2 weeks   Oral Phase 06/13/2021 Oral Phase WFL Oral - Pudding Teaspoon -- Oral - Pudding Cup -- Oral - Honey Teaspoon -- Oral - Honey Cup -- Oral - Nectar Teaspoon -- Oral - Nectar Cup -- Oral - Nectar Straw -- Oral - Thin Teaspoon -- Oral - Thin Cup -- Oral - Thin Straw -- Oral - Puree -- Oral - Mech Soft -- Oral - Regular -- Oral - Multi-Consistency -- Oral - Pill -- Oral Phase - Comment --  Pharyngeal Phase 06/13/2021 Pharyngeal Phase Impaired Pharyngeal- Pudding Teaspoon -- Pharyngeal -- Pharyngeal- Pudding Cup -- Pharyngeal -- Pharyngeal- Honey Teaspoon -- Pharyngeal -- Pharyngeal- Honey Cup -- Pharyngeal -- Pharyngeal- Nectar Teaspoon Reduced tongue base retraction;Pharyngeal residue - valleculae Pharyngeal -- Pharyngeal- Nectar Cup -- Pharyngeal -- Pharyngeal- Nectar Straw Reduced tongue base retraction;Pharyngeal residue - valleculae Pharyngeal -- Pharyngeal- Thin Teaspoon -- Pharyngeal -- Pharyngeal- Thin Cup Reduced tongue base retraction;Pharyngeal residue - valleculae;Penetration/Aspiration before swallow;Delayed swallow initiation-pyriform sinuses Pharyngeal Material enters airway, remains ABOVE vocal cords and not ejected out;Material does not enter airway Pharyngeal- Thin Straw Penetration/Aspiration before swallow;Delayed swallow initiation-pyriform sinuses;Pharyngeal residue -  valleculae;Reduced tongue base retraction Pharyngeal Material enters airway, passes BELOW cords and not ejected out despite cough attempt by patient;Material does not enter airway Pharyngeal- Puree -- Pharyngeal -- Pharyngeal- Mechanical Soft -- Pharyngeal -- Pharyngeal- Regular -- Pharyngeal -- Pharyngeal- Multi-consistency -- Pharyngeal -- Pharyngeal- Pill -- Pharyngeal -- Pharyngeal Comment --  No flowsheet data found. De[place]is, Katherene Ponto 06/13/2021, 10:06 AM                         LOS: 4 days   [facility] Pager on [email or web address]  06/13/2021, 10:58 AM

## 2021-06-13 NOTE — Care Management Important Message (Signed)
Important Message  Patient Details  Name: Coltyn Hanning MRN: [id number] Date of Birth: 1934-03-23   Medicare Important Message Given:  Yes     Analiah Drum [address] 06/13/2021, 3:06 PM

## 2021-06-13 NOTE — Progress Notes (Signed)
Inpatient Rehabilitation Admissions Coordinator   I met at bedside with patient and his wife. Patient sleeping and receiving LTM EEG. I discussed goals and expectations of possible CIR admit pending his progress over the next several days to be able to tolerate the intensity required for admit. She is in agreement to continue discussions. I will follow his progress.  Danne Baxter, RN, MSN Rehab Admissions Coordinator [phone] 06/13/2021 3:26 PM

## 2021-06-13 NOTE — Plan of Care (Signed)
Pt is alert x 1. Pt has been restless, not cooperative with care pt stated he wanted to get up and leave. On call provide called and informed of restlessness. Provider ordered melatonin. Pt refused. Pt is upset about being in hospital, states his wife brought him here and wont help him. Pts wife has been at pts bedside attempting to keep him calm and comfortable. Pt refused oral care, suction. Pt refused PO meds. Pt refused to be turned in bed- pt was turned twice. Pt pulled out IV. Mittens applied. Pts wife also refused for pts to be awoken once pt was asleep for turns or other care. Education provided on importance for repositioning.  Problem: Safety: Goal: Non-violent Restraint(s) 06/13/2021 0836 by Allayne Stack, RN Outcome: Completed/Met 06/13/2021 0836 by Allayne Stack, RN Outcome: Progressing

## 2021-06-13 NOTE — Progress Notes (Signed)
Nutrition Follow-up  DOCUMENTATION CODES:   Severe malnutrition in context of chronic illness, Obesity unspecified  INTERVENTION:   Recommend initiation of bowel regimen as no BM documented since PTA  -Continue Vital Cuisine Shake po TID, each supplement provides 520 kcal and 22 grams of protein -Continue MVI with minerals daily  NUTRITION DIAGNOSIS:   Severe Malnutrition related to chronic illness, cancer and cancer related treatments as evidenced by severe muscle depletion, severe fat depletion.  ongoing  GOAL:   Patient will meet greater than or equal to 90% of their needs  progressing  MONITOR:   Diet advancement, PO intake, Supplement acceptance, Labs, Weight trends, I & O's  REASON FOR ASSESSMENT:   Ventilator    ASSESSMENT:   87 yo male with a PMH of CAD s/p MI, DVT/PE on Coumadin, T2DM, HTN, metastatic prostate cancer w/ mets to lungs on active radiation presents to [facility] on 12/8 after mechanical fall the night prior on coumadin oncologist recommended he come to ED.  12/09 - worsening encephalopathy and hypoxemia requiring intubation 12/10 - extubated   Pt unavailable at time of RD visit. Pt underwent MBSS yesterday. Per SLP, results of MBSS show pt demonstrates a moderate dysphagia which appears to be exacerbated by lethargy and weakness. Noted the oral phase is adeqaute, ut there is a slight delay in swallow initiatio and full airway proteinion. SLP recommended regular solids with nectar thick liquids.  No PO Intake documented   UOP: 683ml x24 hours I/O: +4571ml since admit  Recommend initiation of bowel regimen as no BM documented since PTA  Medications: Scheduled Meds:  albuterol  2.5 mg Nebulization Q4H   aspirin EC  81 mg Oral Daily   Chlorhexidine Gluconate Cloth  6 each Topical Q0600   docusate sodium  100 mg Oral BID   lacosamide  100 mg Oral BID   melatonin  5 mg Oral Once   multivitamin with minerals  1 tablet Oral Daily   pantoprazole  40  mg Oral Daily   polyethylene glycol  17 g Oral Daily   warfarin  2 mg Oral ONCE-1600   Warfarin - Pharmacist Dosing Inpatient   Does not apply q1600  Continuous Infusions:  sodium chloride Stopped (06/30/2021 1831)   ceFEPime (MAXIPIME) IV 200 mL/hr at 06/14/21 0430   heparin 2,100 Units/hr (06/14/21 0600)    Labs: Recent Labs  Lab 06/10/21 0713 06/10/21 0905 06/11/21 0756 06/12/21 0312 06/13/21 0309  NA 135 134*  --  137 139  K 4.4 4.1  --  3.2* 3.9  CL 91*  --   --  94* 95*  CO2 37*  --   --  35* 38*  BUN 20  --   --  14 10  CREATININE 0.78  --   --  0.71 0.55*  CALCIUM 9.8  --   --  8.8* 8.8*  MG 1.6*  --  2.0 1.8 1.8  PHOS 5.4*  --   --  2.8 2.5  GLUCOSE 120*  --   --  100* 121*  CBGs: 105-150 x24 hours   Diet Order:   Diet Order             Diet Heart Room service appropriate? Yes; Fluid consistency: Nectar Thick  Diet effective now                   EDUCATION NEEDS:   Education needs have been addressed  Skin:  Skin Assessment: Reviewed RN Assessment  Last BM:  PTA  Height:   Ht Readings from Last 1 Encounters:  06/08/2021 6\' 3"  (1.905 m)    Weight:   Wt Readings from Last 1 Encounters:  06/14/21 121.8 kg    BMI:  Body mass index is 33.56 kg/m.  Estimated Nutritional Needs:   Kcal:  2200-2400  Protein:  125-140 grams  Fluid:  >2.2 L     Theone Stanley., MS, RD, LDN (she/her/hers) RD pager number and weekend/on-call pager number located in [place].

## 2021-06-13 NOTE — Progress Notes (Signed)
  Transition of Care [facility]) Screening Note   Patient Details  Name: Adrian Foster Date of Birth: 12/26/1933   Transition of Care [facility]) CM/SW Contact:    Pollie Friar, RN Phone Number: 06/13/2021, 1:08 PM    Transition of Care Department [facility]) has reviewed patient. We will continue to monitor patient advancement through interdisciplinary progression rounds. If new patient transition needs arise, please place a TOC consult.

## 2021-06-13 NOTE — Progress Notes (Signed)
Physical Therapy Treatment Patient Details Name: Adrian Foster MRN: [id number] DOB: [birth year] Today's Date: 06/13/2021   History of Present Illness Patient is a 87 yo M w/ pertinent pmh of CAD s/p MI, DVT/PE on Coumadin, T2DM, HTN, metastatic prostate cancer w/ mets to lungs on active radiation presents to [facility] on 12/8 after mechanical fall.  CT head showed 5 mm parafalcine subdural hematoma. Found to be hypoxic with transient episodes of responsiveness once tx to ICU. CXR- RLL PNA, Chest CT- PEs. Intubated 12/9-12/10.    PT Comments    Patient with limited participation today c/o LE pain when sitting on EOB and somewhat lethargic on EEG continuous monitoring so performed in bed therex and mobility.  Patient disoriented to situation and joking about being in "[name]" initially prior to stating "[facility]".  Feel he remains appropriate for acute inpatient rehab and hopeful for increased mobility next session.    Recommendations for follow up therapy are one component of a multi-disciplinary discharge planning process, led by the attending physician.  Recommendations may be updated based on patient status, additional functional criteria and insurance authorization.  Follow Up Recommendations  Acute inpatient rehab (3hours/day)     Assistance Recommended at Discharge Frequent or constant Supervision/Assistance  Equipment Recommendations  Other (comment) (TBA)    Recommendations for Other Services       Precautions / Restrictions Precautions Precautions: Fall Precaution Comments: keep SBP <160     Mobility  Bed Mobility Overal bed mobility: Needs Assistance             General bed mobility comments: declined to sit up and wife in the room nervous for me to try without +2; attempted for pt to help with scooting up in bed, but unable due to cognition so NT in to assist wtih +2 total A    Transfers                        Ambulation/Gait                   Stairs              Wheelchair Mobility    Modified Rankin (Stroke Patients Only) Modified Rankin (Stroke Patients Only) Pre-Morbid Rankin Score: Moderate disability Modified Rankin: Moderately severe disability     Balance                                            Cognition Arousal/Alertness: Awake/alert Behavior During Therapy: WFL for tasks assessed/performed Overall Cognitive Status: Impaired/Different from baseline Area of Impairment: Orientation;Memory;Following commands;Safety/judgement;Problem solving;Attention                 Orientation Level: Situation Current Attention Level: Sustained Memory: Decreased short-term memory Following Commands: Follows one step commands with increased time;Follows one step commands consistently Safety/Judgement: Decreased awareness of deficits   Problem Solving: Slow processing;Decreased initiation;Difficulty sequencing;Requires verbal cues;Requires tactile cues          Exercises General Exercises - Upper Extremity Shoulder Flexion: 5 reps;Strengthening;Both;Supine Elbow Flexion: AROM;10 reps;Supine;Both General Exercises - Lower Extremity Ankle Circles/Pumps: AROM;Both;5 reps;Supine Heel Slides: AAROM;Both;5 reps;Supine Hip Flexion/Marching: AROM;AAROM;Both;5 reps;Supine    General Comments General comments (skin integrity, edema, etc.): wife in the room and supportive, pt on continuous EEG monitoring      Pertinent Vitals/Pain Faces Pain Scale: Hurts little more Pain Location:  LE's with movement Pain Descriptors / Indicators: Aching;Grimacing;Moaning Pain Intervention(s): Monitored during session;Repositioned;Limited activity within patient's tolerance    Home Living                          Prior Function            PT Goals (current goals can now be found in the care plan section) Progress towards PT goals: Not progressing toward goals - comment    Frequency    Min  4X/week      PT Plan Current plan remains appropriate    Co-evaluation              AM-PAC PT "6 Clicks" Mobility   Outcome Measure  Help needed turning from your back to your side while in a flat bed without using bedrails?: A Lot Help needed moving from lying on your back to sitting on the side of a flat bed without using bedrails?: Total Help needed moving to and from a bed to a chair (including a wheelchair)?: Total Help needed standing up from a chair using your arms (e.g., wheelchair or bedside chair)?: Total Help needed to walk in hospital room?: Total Help needed climbing 3-5 steps with a railing? : Total 6 Click Score: 7    End of Session   Activity Tolerance: Patient limited by fatigue;Patient limited by pain Patient left: in bed;with call bell/phone within reach;with family/visitor present   PT Visit Diagnosis: Hemiplegia and hemiparesis;Unsteadiness on feet (R26.81);Difficulty in walking, not elsewhere classified (R26.2) Hemiplegia - Right/Left: Right Hemiplegia - dominant/non-dominant: Dominant Hemiplegia - caused by: Cerebral infarction     Time: 1355-1418 PT Time Calculation (min) (ACUTE ONLY): 23 min  Charges:  $Therapeutic Exercise: 8-22 mins $Therapeutic Activity: 8-22 mins                     Adrian Foster, PT Acute Rehabilitation Services Pager:[phone] Office:[phone] 06/13/2021    Adrian Foster 06/13/2021, 3:24 PM

## 2021-06-13 NOTE — TOC Benefit Eligibility Note (Addendum)
Patient Teacher, English as a foreign language completed.    The patient is currently admitted and upon discharge could be taking Lacosamide (Vimpat) 50 mg.  The current 30 day co-pay is, $33.91   The patient is currently admitted and upon discharge could be taking Lacosamide (Vimpat) 100 mg.  The current 30 day co-pay is, $52.55   The patient is insured through [place], [place] Patient Advocate Specialist [place] Patient Advocate Team Direct Number: [phone]  Fax: [phone]

## 2021-06-13 NOTE — Progress Notes (Addendum)
Subjective: Patient by the bedside states he had right upper extremity jerking movements yesterday lasted for about 10 to 15 minutes.  Also states he has been very agitated.  ROS: negative except above  Examination  Vital signs in last 24 hours: Temp:  [97.3 F (36.3 C)-97.5 F (36.4 C)] 97.5 F (36.4 C) (12/11 2300) Pulse Rate:  [89-91] 89 (12/11 2300) Resp:  [14-20] 20 (12/11 2300) BP: (102-106)/(58-66) 102/58 (12/11 2300) SpO2:  [94 %-100 %] 100 % (12/11 2300)  General: lying in bed, NAD CVS: pulse-normal rate and rhythm RS: breathing comfortably, CTAB Extremities: warm, no edema  Neuro: MS: Alert, oriented, follows commands CN: pupils equal and reactive,  EOMI, face symmetric, tongue midline, normal sensation over face, Motor: 5/5 strength in RUE, 4+/5 in LUE, 4-/5 in BL LE  Basic Metabolic Panel: Recent Labs  Lab 06/27/2021 1303 06/10/21 0713 06/10/21 0905 06/11/21 0756 06/12/21 0312 06/13/21 0309  NA 133* 135 134*  --  137 139  K 4.7 4.4 4.1  --  3.2* 3.9  CL 89* 91*  --   --  94* 95*  CO2 35* 37*  --   --  35* 38*  GLUCOSE 161* 120*  --   --  100* 121*  BUN 26* 20  --   --  14 10  CREATININE 0.78 0.78  --   --  0.71 0.55*  CALCIUM 9.6 9.8  --   --  8.8* 8.8*  MG  --  1.6*  --  2.0 1.8 1.8  PHOS  --  5.4*  --   --  2.8 2.5    CBC: Recent Labs  Lab 06/18/2021 1303 06/10/21 0713 06/10/21 0905 06/11/21 1034 06/12/21 0312 06/13/21 0309  WBC 9.9 6.9  --  7.0 6.7 5.6  NEUTROABS 8.9*  --   --   --  6.1  --   HGB 12.7* 12.7* 13.3 10.7* 10.9* 10.7*  HCT 41.1 43.8 39.0 34.8* 36.8* 35.7*  MCV 95.4 100.2*  --  96.1 97.9 99.4  PLT 333 279  --  230 235 210     Coagulation Studies: Recent Labs    06/13/21 0309  LABPROT 26.6*  INR 2.5*    Imaging No new brain imaging overnight  ASSESSMENT AND PLAN: 87 year old male with transient episode of decreased responsiveness in the setting of a parafalcine subdural hematoma.     Transient alteration of  awareness - ddx include seizure vs due to hypooxygenation. Patient is at higher risk of seizures due to SDH.    Recommendation -Episode of right upper extremity jerking could be focal motor seizure.  Will obtain video EEG to see if we can capture an event -Patient has been agitated which could be due to [place].  We will discussed with case manager to find out patient's co-pay for Vimpat. -Patient most likely had provoked seizure within 2 weeks of SDH. Therefore, patient's risk of long term epilepsy is lower.  - Recommend neurology f/u in 2-3 months. Can consider weaning off antiseizure medications at that time - continue seizure precautions - management of rest of comorbidities per primary team  Addendum - Vimpat copay about $50. Will switch to vimpat 100mg  BID    I have spent a total of 36 minutes with the patient reviewing hospital notes,  test results, labs and examining the patient as well as establishing an assessment and plan that was discussed personally with the patient's wife at bedside.  > 50% of time was spent in direct  patient care.  Zeb Comfort Epilepsy [facility] For questions after 5pm please refer to AMION to reach the Neurologist on call

## 2021-06-13 NOTE — Progress Notes (Signed)
Modified Barium Swallow Progress Note  Patient Details  Name: Adrian Foster MRN: [id number] Date of Birth: 03/25/1934  Today's Date: 06/13/2021  Modified Barium Swallow completed.  Full report located under Chart Review in the Imaging Section.  Brief recommendations include the following:  Clinical Impression  Pt demonstrates a moderate dysphagia, appearing to be exacerbated by lethargy and weakness.Oral phase is adequate, but there is a slight delay in swallow initiation and full airway protection as well as weakness of base of tongue. Initially pt tolerated straw sips of thin well, but one large sip resulted in spillage into the airway with weak cough response that pt never fully cleared and subsequent attempts to regulate bolus size still resulted in further penetration events that contributed to barium in the vestibule and airway. Nectar thick liquids given in smaller sips did not result in penetration or aspiration, though vallecular residual with nectar and solids were present. A cued effortful swallow or second swallow benefitted pt. Recommend regular solids and nectar thick liquids during rehabilitation. Pharyngeal strengthening and respiratory muscle strength training for more effective cough may be beneficia as well as behavioral strategies to limit to single cup sips. Recommend AIR at d/c for intensive rehabiltation and eventual diet upgrade.   Swallow Evaluation Recommendations       SLP Diet Recommendations: Regular solids;Nectar thick liquid   Liquid Administration via: Cup;No straw   Medication Administration: Whole meds with puree   Supervision: Staff to assist with self feeding   Compensations: Slow rate;Small sips/bites;Effortful swallow;Multiple dry swallows after each bite/sip   Postural Changes: Remain semi-upright after after feeds/meals (Comment);Seated upright at 90 degrees   Oral Care Recommendations: Oral care BID     Herbie [place], MA [place] Pager [phone] Office [phone]    Lynann Beaver 06/13/2021,9:50 AM

## 2021-06-13 NOTE — Significant Event (Signed)
Rapid Response Event Note   Reason for Call :  O2 sat mid 80s on [place]  Initial Focused Assessment:  RT has placed patient on [place] 7L  O2 sats 94-98% Patient is drowsy but responds easily and answers questions.   Lung sounds decreased bases He will cough on command,  moderate strength  BP 98/51  HR 98  RR 24  O2 sat 100% on 7L [place]  EEG in place Wife at bedside   Interventions:  Albuterol nebulizer Chest PT  Post PT patient with congested cough.  Mostly coughs secretions upto the back of his throat.  Plan of Care:  Oral care Pulmonary toilet Mobility progression   Event Summary:   MD Notified: per RN Call Time: [place] Time: 6016 End Time: [place]  Raliegh Ip, RN

## 2021-06-14 ENCOUNTER — Inpatient Hospital Stay (HOSPITAL_COMMUNITY): Payer: Medicare Other

## 2021-06-14 DIAGNOSIS — J690 Pneumonitis due to inhalation of food and vomit: Secondary | ICD-10-CM

## 2021-06-14 LAB — BLOOD GAS, ARTERIAL
Acid-Base Excess: 14.4 mmol/L — ABNORMAL HIGH (ref 0.0–2.0)
Acid-Base Excess: 15.2 mmol/L — ABNORMAL HIGH (ref 0.0–2.0)
Bicarbonate: 41.6 mmol/L — ABNORMAL HIGH (ref 20.0–28.0)
Bicarbonate: 40.2 mmol/L — ABNORMAL HIGH (ref 20.0–28.0)
Drawn by: 34762
FIO2: 44
FIO2: 30
O2 Saturation: 97.9 %
O2 Saturation: 92.6 %
Patient temperature: 36.7
Patient temperature: 37
pCO2 arterial: 90.9 mmHg (ref 32.0–48.0)
pCO2 arterial: 57.5 mmHg — ABNORMAL HIGH (ref 32.0–48.0)
pH, Arterial: 7.281 — ABNORMAL LOW (ref 7.350–7.450)
pH, Arterial: 7.458 — ABNORMAL HIGH (ref 7.350–7.450)
pO2, Arterial: 107 mmHg (ref 83.0–108.0)
pO2, Arterial: 56.3 mmHg — ABNORMAL LOW (ref 83.0–108.0)

## 2021-06-14 LAB — CBC
HCT: 33.9 % — ABNORMAL LOW (ref 39.0–52.0)
Hemoglobin: 9.9 g/dL — ABNORMAL LOW (ref 13.0–17.0)
MCH: 29.6 pg (ref 26.0–34.0)
MCHC: 29.2 g/dL — ABNORMAL LOW (ref 30.0–36.0)
MCV: 101.2 fL — ABNORMAL HIGH (ref 80.0–100.0)
Platelets: 200 10*3/uL (ref 150–400)
RBC: 3.35 MIL/uL — ABNORMAL LOW (ref 4.22–5.81)
RDW: 16.5 % — ABNORMAL HIGH (ref 11.5–15.5)
WBC: 5 10*3/uL (ref 4.0–10.5)
nRBC: 0 % (ref 0.0–0.2)

## 2021-06-14 LAB — PROTIME-INR
INR: 2.5 — ABNORMAL HIGH (ref 0.8–1.2)
Prothrombin Time: 27 seconds — ABNORMAL HIGH (ref 11.4–15.2)

## 2021-06-14 LAB — GLUCOSE, CAPILLARY
Glucose-Capillary: 105 mg/dL — ABNORMAL HIGH (ref 70–99)
Glucose-Capillary: 102 mg/dL — ABNORMAL HIGH (ref 70–99)
Glucose-Capillary: 105 mg/dL — ABNORMAL HIGH (ref 70–99)

## 2021-06-14 LAB — HEPARIN LEVEL (UNFRACTIONATED)
Heparin Unfractionated: 0.23 IU/mL — ABNORMAL LOW (ref 0.30–0.70)
Heparin Unfractionated: 0.44 IU/mL (ref 0.30–0.70)

## 2021-06-14 IMAGING — CT CT HEAD W/O CM
4 of 5 series · 15 of 47 positions shown, 16 images · non-contrast
Comparison: MRI [DATE].  CT studies [DATE] and [DATE].

CLINICAL DATA: Mental status changes of unknown cause

EXAM:
CT HEAD WITHOUT CONTRAST
TECHNIQUE: Contiguous axial images were obtained from the base of the skull
through the vertex without intravenous contrast.

[Series 3: head without · axial · non-contrast · 0.47mm/px · z∈[-179,-69]mm · 4 of 38 slices shown, 5 images]
[im 8/38  brain]
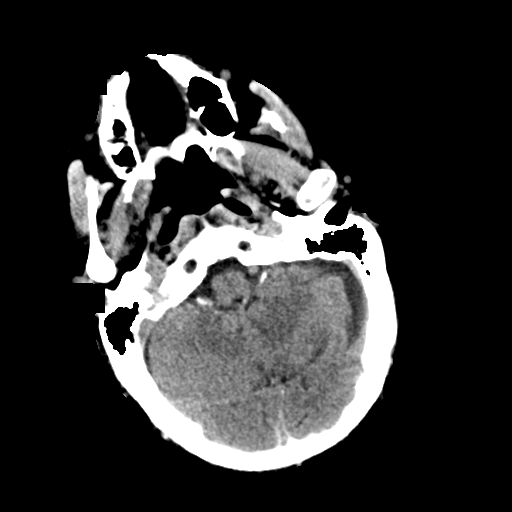
[im 8/38  bone]
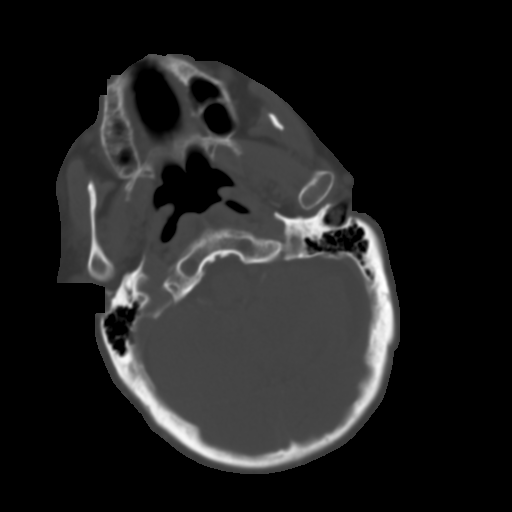
[im 15/38  brain]
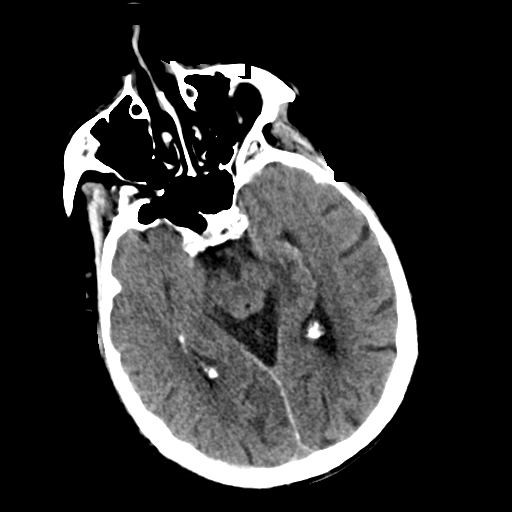
[im 23/38  brain]
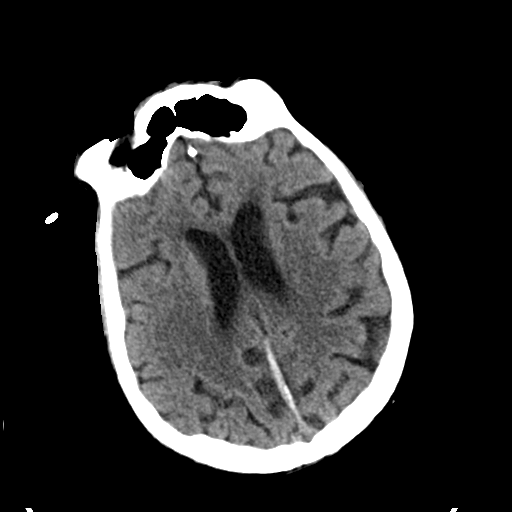
[im 30/38  brain]
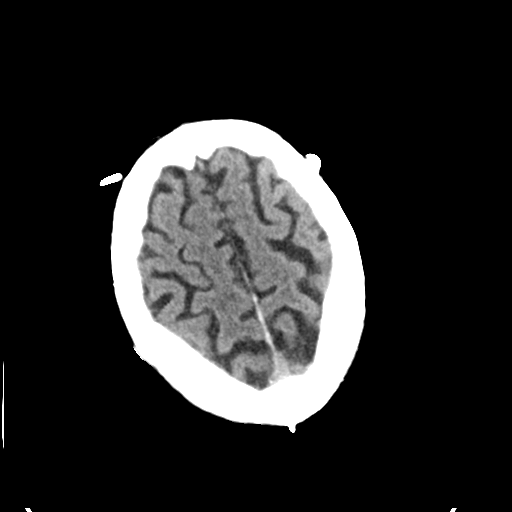

[Series 5: head without cor · coronal · non-contrast · 0.35mm/px · 3 of 77 slices shown]
[im 30/77  brain]
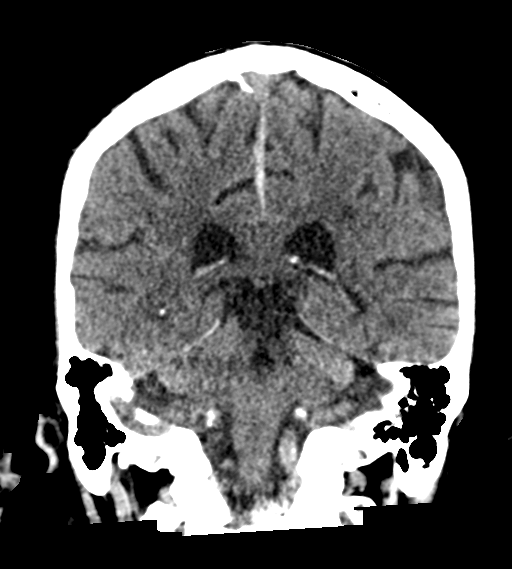
[im 36/77  brain]
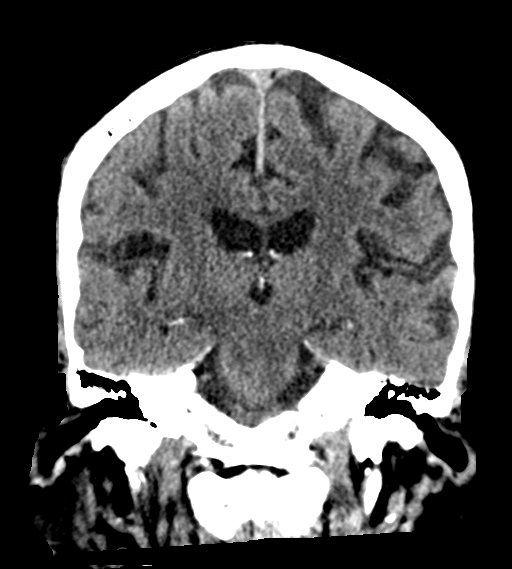
[im 42/77  brain]
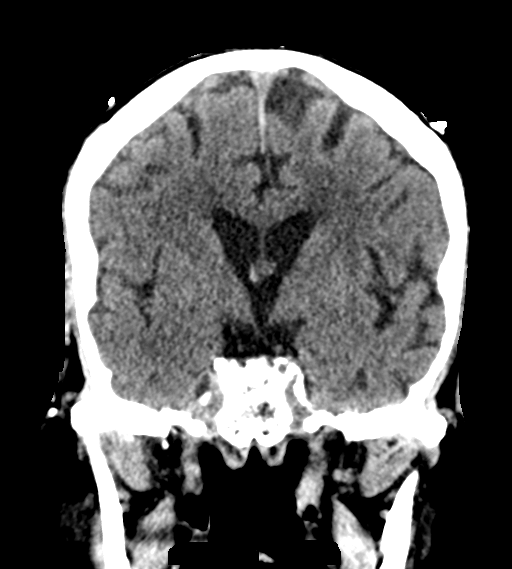

[Series 6: head without sag · sagittal · non-contrast · 0.38mm/px · 3 of 62 slices shown]
[im 21/62  brain]
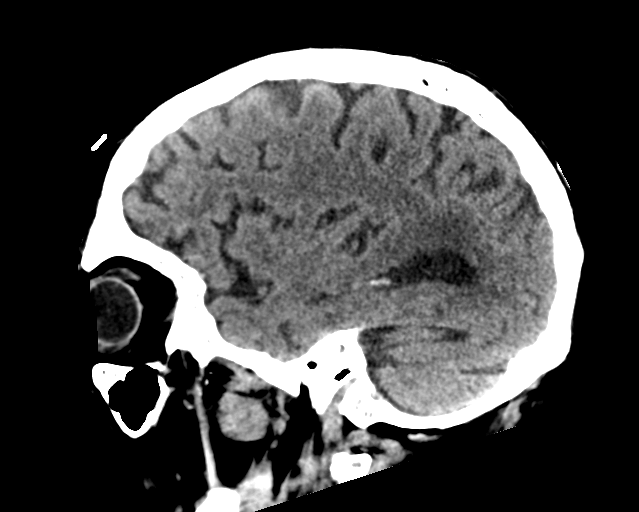
[im 31/62  brain]
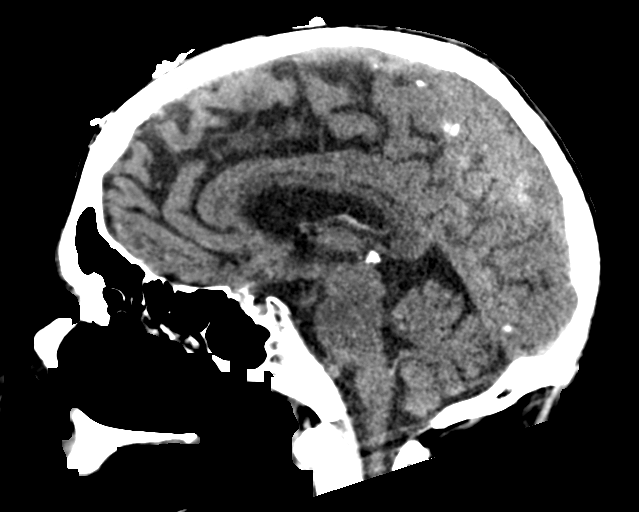
[im 41/62  brain]
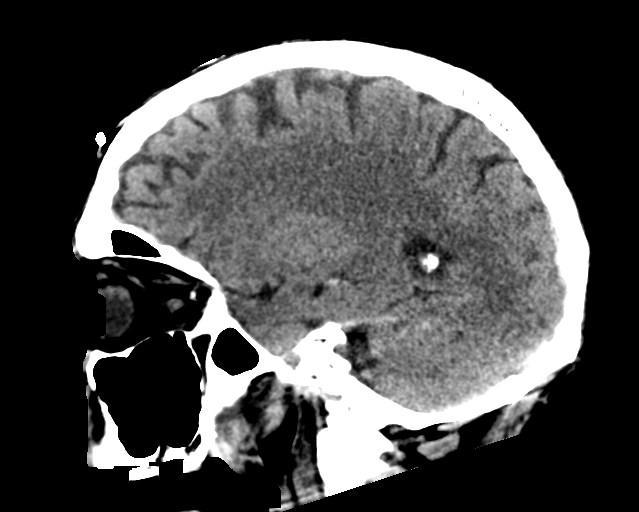

[Series 7: head without ax · axial · non-contrast · 0.35mm/px · z∈[-151,-37]mm · 5 of 38 slices shown]
[im 7/38  brain]
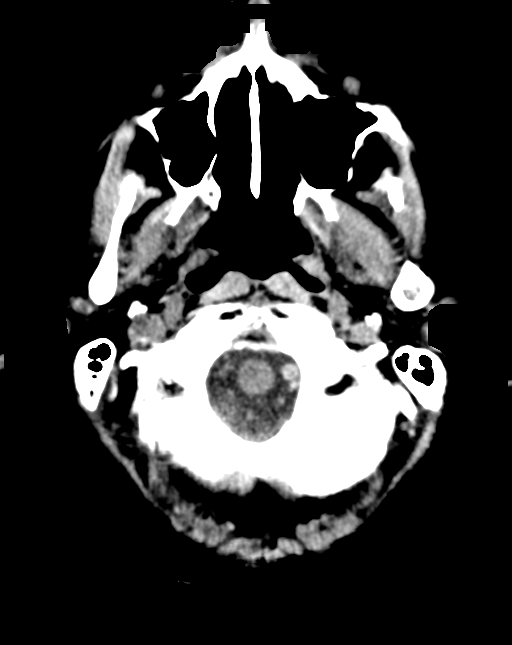
[im 13/38  brain]
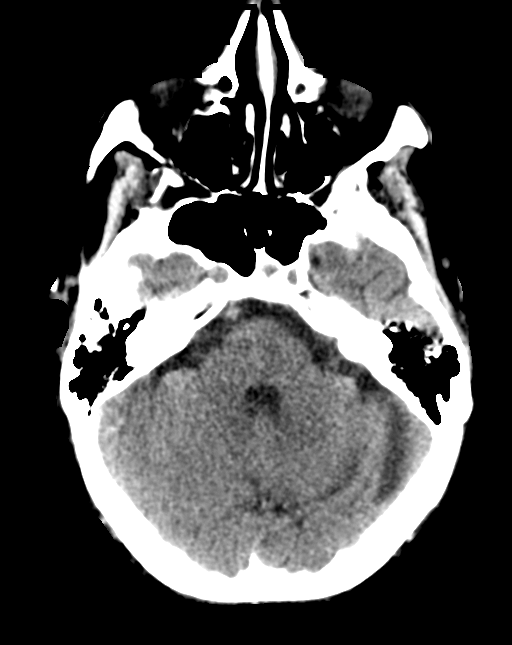
[im 19/38  brain]
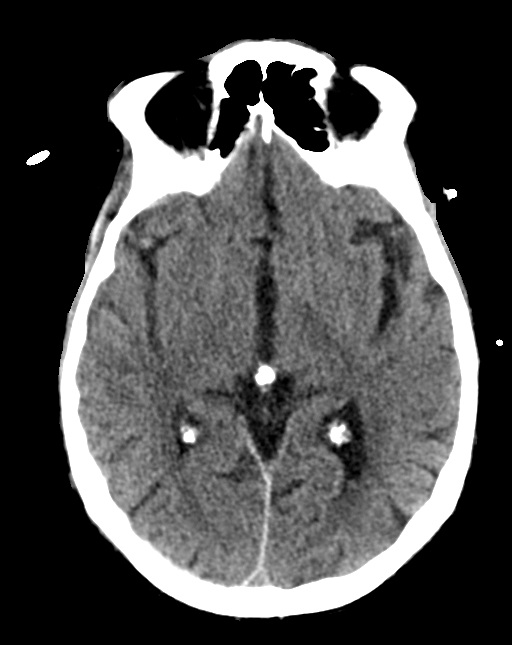
[im 25/38  brain]
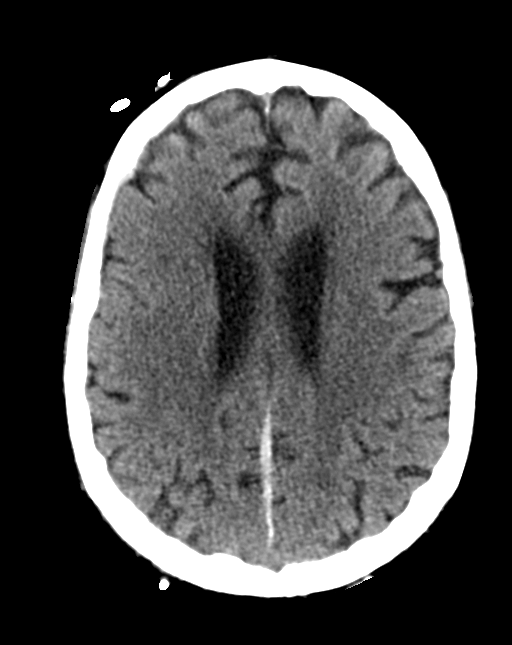
[im 31/38  brain]
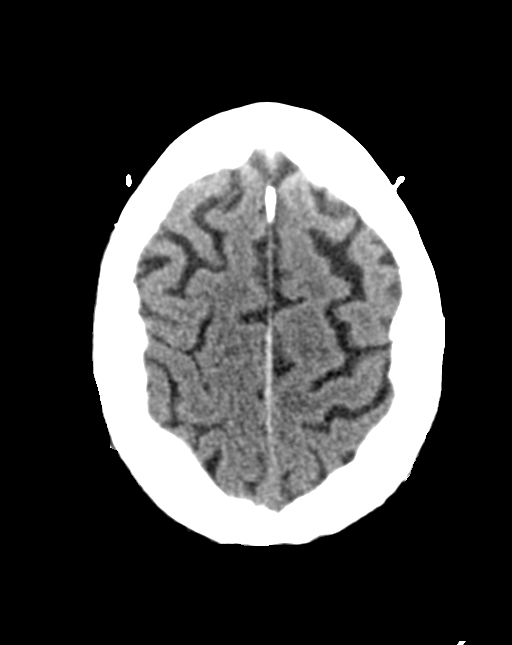

[15 of 47 positions shown; findings below may reference images not displayed]

FINDINGS: Brain: No change or acute finding. Generalized atrophy. Chronic
small-vessel ischemic changes affecting the cerebral hemispheric
white matter. No sign of acute infarction, mass lesion or
hydrocephalus. Subdural hematoma along the right side of the
posterior falx and right posterior and anterior convexity is no
larger and could be a mm smaller. No mass effect.

Vascular: There is atherosclerotic calcification of the major
vessels at the base of the brain.

Skull: Negative

Sinuses/Orbits: Clear/normal

Other: None
IMPRESSION: No change since the prior exam. Atrophy and chronic small-vessel
ischemic changes. No evidence of acute infarction.

Stable or slightly diminishing small subdural hematoma along the
right side of the posterior falx and right posterior and anterior
convexity, maximal thickness 3-4 mm.

## 2021-06-14 MED ORDER — PIPERACILLIN-TAZOBACTAM 3.375 G IVPB
3.3750 g | Freq: Three times a day (TID) | INTRAVENOUS | Status: DC
  Administered 2021-06-14 – 2021-06-23 (×26): 3.375 g via INTRAVENOUS
  Filled 2021-06-14 (×26): qty 50

## 2021-06-14 MED ORDER — LACOSAMIDE 50 MG PO TABS: 50.0000 mg | ORAL_TABLET | Freq: Two times a day (BID) | ORAL | Status: DC

## 2021-06-14 MED ORDER — PIPERACILLIN-TAZOBACTAM 4.5 G IVPB
4.5000 g | Freq: Once | INTRAVENOUS | Status: AC
  Administered 2021-06-14: 4.5 g via INTRAVENOUS
  Filled 2021-06-14 (×3): qty 100

## 2021-06-14 MED ORDER — WARFARIN SODIUM 2 MG PO TABS
2.0000 mg | ORAL_TABLET | Freq: Once | ORAL | Status: DC
  Filled 2021-06-14: qty 1

## 2021-06-14 MED ORDER — SODIUM CHLORIDE 0.9 % IV SOLN
50.0000 mg | Freq: Two times a day (BID) | INTRAVENOUS | Status: DC
  Administered 2021-06-14 – 2021-06-16 (×4): 50 mg via INTRAVENOUS
  Filled 2021-06-14 (×5): qty 5

## 2021-06-14 NOTE — Plan of Care (Signed)

## 2021-06-14 NOTE — Progress Notes (Signed)
ANTICOAGULATION CONSULT NOTE  Pharmacy Consult for Heparin and Warfarin Indication: pulmonary embolus  Allergies  Allergen Reactions   Azithromycin Nausea And Vomiting   Levofloxacin Diarrhea    Patient Measurements: Height: 6\' 3"  (190.5 cm) Weight: 121.8 kg (268 lb 8.3 oz) IBW/kg (Calculated) : 84.5  Heparin Dosing Weight: 106.6 kg  Vital Signs: Temp: 98.1 F (36.7 C) (12/13 0400) Temp Source: Axillary (12/13 0400) BP: 100/64 (12/13 1155) Pulse Rate: 95 (12/13 1244)  Labs: Recent Labs    06/12/21 0312 06/12/21 1229 06/13/21 0309 06/14/21 0201 06/14/21 1245  HGB 10.9*  --  10.7* 9.9*  --   HCT 36.8*  --  35.7* 33.9*  --   PLT 235  --  210 200  --   LABPROT  --   --  26.6* 27.0*  --   INR  --   --  2.5* 2.5*  --   HEPARINUNFRC 0.22*   < > 0.34 0.23* 0.44  CREATININE 0.71  --  0.55*  --   --    < > = values in this interval not displayed.    Estimated Creatinine Clearance: 94.9 mL/min (A) (by C-G formula based on SCr of 0.55 mg/dL (L)).  Assessment: 87 years of age male with metastatic prostate cancer receiving radiation and on chronic warfarin therapy for history of DVT and PE who was admitted post fall with new small SDH. INR of 8.8 on admission 12/08, Vitamin K 5mg  IV given x1. INR trended down to 1.5. CT showed multiple small pulmonary embolisms. Pharmacy consulted to start IV Heparin 12/09. No surgical intervention of small SDH recommended at this time. CCM made decision to continue with warfarin - consulted Pharmacy to resume 12/11. PTA warfarin dose: 5mg  MWF and 2.5mg  all other days.   INR has been 2.5 12/12 and 12/13. CBC stable, no s/sx bleeding reported. Per discussion w/ provider, ok to stop heparin infusion. Patient AMS improved, is able to take PO meds again per discussion with RN.  Goal of Therapy:  INR 2-3 Heparin level 0.3-0.7 units/ml Monitor platelets by anticoagulation protocol: Yes   Plan:  Stop heparin infusion at 16:00 Give warfarin 2mg  x1  today  Continue to monitor INR, H&H and platelets    Thank you for allowing pharmacy to be a part of this patients care.  Ardyth Harps, PharmD Clinical Pharmacist

## 2021-06-14 NOTE — Progress Notes (Signed)
Patient gradually declined in mentation today. ABG was drawn and pco2 was critically elevated. Respiratory started patient on bipap and patient was made NPO. HR currently 92 and o2 95. RN will continue to monitor closely.

## 2021-06-14 NOTE — Progress Notes (Signed)
Patient slightly agitated. Covering doctor aware. No acute distress noted. Will continue to monitor  06/14/21 2305  Vitals  Temp 98.5 F (36.9 C)  Temp Source Axillary  BP 108/66  MAP (mmHg) 79  BP Location Left Arm  BP Method Automatic  Patient Position (if appropriate) Lying  Pulse Rate 93  Pulse Rate Source Monitor  ECG Heart Rate 94  Resp (!) 26  Level of Consciousness  Level of Consciousness Responds to Voice  MEWS COLOR  MEWS Score Color Yellow  Oxygen Therapy  SpO2 96 %  O2 Device Bi-PAP  MEWS Score  MEWS Temp 0  MEWS Systolic 0  MEWS Pulse 0  MEWS RR 2  MEWS LOC 1  MEWS Score 3

## 2021-06-14 NOTE — Progress Notes (Signed)
EEG maintenance performed. No skin breakdown noted.

## 2021-06-14 NOTE — Progress Notes (Signed)
Occupational Therapy Treatment Patient Details Name: Adrian Foster MRN: [id number] DOB: 1934-01-08 Today's Date: 06/14/2021   History of present illness Patient is a 87 yo M presents to [facility] on 12/8 after mechanical fall.  CT head showed 5 mm parafalcine subdural hematoma. Found to be hypoxic with transient episodes of responsiveness once tx to ICU. CXR- RLL PNA, Chest CT- PEs. Intubated 12/9-12/10.  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   OT comments  Adrian Foster is making limited progress this session with increased lethargy and poor active participation. Per his wife, he has been less alert today. Session completed with PT for pt and therapist safety. Pt tolerated PROM exercises well at bed level. He required total A +2 for bed mobility, once sitting EOB he was mod A for sitting balance. He kept his eyes closed for the majority of the session despite max cues. He also required max A +2 fro grooming EOB for balance and hand over hand. He will benefit from OT acutely. D/c recommendation remains appropriate.    Recommendations for follow up therapy are one component of a multi-disciplinary discharge planning process, led by the attending physician.  Recommendations may be updated based on patient status, additional functional criteria and insurance authorization.    Follow Up Recommendations  Acute inpatient rehab (3hours/day)    Assistance Recommended at Discharge Frequent or constant Supervision/Assistance  Equipment Recommendations  BSC/3in1       Precautions / Restrictions Precautions Precautions: Fall Precaution Comments: keep SBP <160, large hematoma on sacrum Restrictions Weight Bearing Restrictions: No       Mobility Bed Mobility Overal bed mobility: Needs Assistance Bed Mobility: Rolling;Sidelying to Sit;Sit to Sidelying Rolling: Total assist;+2 for physical assistance Sidelying to sit: Total assist;+2 for  physical assistance;HOB elevated     Sit to sidelying: Total assist;+2 for physical assistance General bed mobility comments: pt with no initiation despite max cues due to lethargy, needs hand over hand assist to roll but not pulling up on side rail to assist, +2 totalA for BLE and trunk to log roll to L EOB.    Transfers                   General transfer comment: pt too lethargic to attempt, not following cues for seated scooting and needs external support for seated balance/trunk.     Balance Overall balance assessment: Needs assistance Sitting-balance support: Feet supported;Bilateral upper extremity supported Sitting balance-Leahy Scale: Poor Sitting balance - Comments: Pt c/o increased discomfort when leaning to L side/WB through L hip (hematoma on sacrum); constant modA for trunk support while seated Postural control: Right lateral lean                                 ADL either performed or assessed with clinical judgement   ADL Overall ADL's : Needs assistance/impaired     Grooming: Maximal assistance;Sitting Grooming Details (indicate cue type and reason): max A for hand over hand. pt participating minimally in task                             Functional mobility during ADLs: Maximal assistance;+2 for physical assistance;+2 for safety/equipment General ADL Comments: pt spent at bed level and EOB only. max-total A +2 fro all tasks assessed. Pt with minimal participation, would say short  simple words intermittently    Extremity/Trunk Assessment Upper Extremity Assessment Upper Extremity Assessment: Generalized weakness;Difficult to assess due to impaired cognition (PROM is overall WFL. Pt abel to lean onto both UEs in sitting and push himself back to midline. Otherwise difficult to assess due to lethargy & cognition)   Lower Extremity Assessment Lower Extremity Assessment: Defer to PT evaluation        Vision   Vision Assessment?: No  apparent visual deficits Additional Comments: difficult to assess - pt kep eye closed 98% of session despite max cues and position change   Perception Perception Perception: Not tested   Praxis Praxis Praxis: Not tested    Cognition Arousal/Alertness: Lethargic;Suspect due to medications Behavior During Therapy: Flat affect Overall Cognitive Status: Impaired/Different from baseline Area of Impairment: Orientation;Memory;Following commands;Safety/judgement;Problem solving;Attention;Awareness                 Orientation Level:  (pt too lethargic to answer orientation questions or state his name) Current Attention Level: Focused   Following Commands: Follows one step commands with increased time;Follows one step commands inconsistently Safety/Judgement: Decreased awareness of deficits;Decreased awareness of safety Awareness: Intellectual Problem Solving: Slow processing;Decreased initiation;Difficulty sequencing;Requires verbal cues;Requires tactile cues General Comments: pt maintains eyes closed, only opens 1 eye briefly (about a second) when cued, only opens both eyes once despite max cues and positional change to upright. Poor initiation and participation this date. Spouse present in room initially but left room during session.          Exercises Exercises: Other exercises General Exercises - Upper Extremity Shoulder Flexion: PROM;Both;Supine;Seated (x2 reps ea position) General Exercises - Lower Extremity Ankle Circles/Pumps: PROM;AAROM;Both;10 reps;Supine Heel Slides: PROM;Both;5 reps;Supine Other Exercises Other Exercises: encouraged pt's wife to complete PROM/AAROM exercises & maintain arousal if able   Shoulder Instructions       General Comments pt on continuous EEG monitoring, no overt seizure but non-verbal seated EOB will nod/shake head to max cues/questions and minor tremors at times in UE/LE unclear if seizure activity so did not press button; BP 100/64 auto  checked while seated EOB, HR 94 bpm and SpO2 100% on 6.5L HF [place] throughout; RR 23 rpm while seated    Pertinent Vitals/ Pain       Pain Assessment: Faces Faces Pain Scale: Hurts little more Pain Location: when leaning toward L side/hip while seated (likely due to hematoma, pt unable to localize pain) Pain Descriptors / Indicators: Grimacing;Moaning Pain Intervention(s): Limited activity within patient's tolerance  Frequency  Min 2X/week        Progress Toward Goals  OT Goals(current goals can now be found in the care plan section)  Progress towards OT goals: Progressing toward goals  Acute Rehab OT Goals Patient Stated Goal: did not state OT Goal Formulation: With patient/family Time For Goal Achievement: 06/26/21 Potential to Achieve Goals: Good ADL Goals Pt Will Transfer to Toilet: with min assist;stand pivot transfer;bedside commode Pt/caregiver will Perform Home Exercise Program: Increased strength;Both right and left upper extremity;With minimal assist;With written HEP provided;With theraband Additional ADL Goal #1: Pt will increase to x6 mins of OOB ADL tasks in sitting with set-upA.  Plan Discharge plan remains appropriate    Co-evaluation    PT/OT/SLP Co-Evaluation/Treatment: Yes Reason for Co-Treatment: Complexity of the patient's impairments (multi-system involvement);Necessary to address cognition/behavior during functional activity;For patient/therapist safety;To address functional/ADL transfers PT goals addressed during session: Mobility/safety with mobility;Strengthening/ROM;Balance OT goals addressed during session: ADL's and self-care;Proper use of Adaptive equipment and DME  AM-PAC OT "6 Clicks" Daily Activity     Outcome Measure   Help from another person eating meals?: A Lot Help from another person taking care of personal grooming?: A Lot Help from another person toileting, which includes using toliet, bedpan, or urinal?: A Lot Help from another  person bathing (including washing, rinsing, drying)?: A Lot Help from another person to put on and taking off regular upper body clothing?: A Lot Help from another person to put on and taking off regular lower body clothing?: A Lot 6 Click Score: 12    End of Session Equipment Utilized During Treatment: Oxygen  OT Visit Diagnosis: Unsteadiness on feet (R26.81);Muscle weakness (generalized) (M62.81);Pain;Other symptoms and signs involving cognitive function   Activity Tolerance Patient limited by lethargy   Patient Left in bed;with call bell/phone within reach;with bed alarm set   Nurse Communication Mobility status        Time: 3668-1594 OT Time Calculation (min): 28 min  Charges: OT General Charges $OT Visit: 1 Visit OT Treatments $Therapeutic Activity: 8-22 mins   Taylen Wendland A Jefferie Holston 06/14/2021, 2:51 PM

## 2021-06-14 NOTE — Progress Notes (Signed)
CPT  held at this time due to patient on BIPAP and continuous EEG.

## 2021-06-14 NOTE — Progress Notes (Addendum)
[facility] PROGRESS NOTE   Adrian Foster TOI:[id number] DOB: Dec 08, 1933 DOA: 06/02/2021  PCP: Charleston Poot, MD  Brief History/Interval Summary: Patient is a 87 yo M w/ pertinent pmh of CAD s/p MI, DVT/PE on Coumadin, T2DM, HTN, metastatic prostate cancer w/ mets to lungs on active radiation presents to [facility] on 12/8 after mechanical fall the night prior on coumadin. Oncologist recommended he come to ED.   On arrival to [facility] ED on 12/8, patient was found to be hypoxic in 70s on room air. Sats improved with supplemental O2. Patient has been taking Coumadin for subsegmental PE outpatient. INR was 8.8. CT chest shows PE within segmental and subsegmental branches on RLL. CT head showed 5 mm parafalcine subdural hematoma. Neurosurgery consulted and recommend no surgery and recommend not severe enough to reverse anticoagulation. Vitamin K given to improve INR to allow [place] to start patient on heparin drip. CXR shows possible RLL pneumonia; given ceftriaxone/azithromycin. On 12/9 patient had worsening encephalopathy and hypoxemia requiring intubation. Extubated on 12/10. Subsequently transferred to the floor.  Chest x-ray showed worsening aeration in the right lung.  Pulmonology was consulted.   Consultants: Critical care medicine.  Neurosurgery.  Procedures: Intubation followed by extubation the following day    Subjective/Interval History: Patient noted to be somnolent this morning.  Wife is at the bedside.  Had a reasonably good night last night.       Assessment/Plan:  Acute metabolic encephalopathy/concern for seizure This was in the setting of hypoxemia and subdural hematoma.  Seems to be stable.  Somnolent this morning.  Wonder if Keppra could be contributing.  Patient was changed over to Vimpat yesterday.  Neurology has been following.  Acute respiratory failure with hypoxia and hypercapnia Was on mechanical ventilation.  Extubated on 12/10.   Patient also with history of  metastatic prostate cancer with lung metastasis which is also contributing. Chest x-ray done on 12/12 showed near complete opacification of the right hemithorax with scattered aeration of the right upper lobe.  A moderate pleural effusion was suspected. CT angiogram chest done on 12/8 that showed diffuse multifocal airspace opacity bilaterally.  Patchy right lower lobe airspace disease was also noted at that time.  There was a trace right pleural effusion at that time. Pulmonology was consulted.  Ultrasound actually showed consolidation rather than pleural effusion.  It is felt that patient has significant aspiration pneumonia on the right side.  Chest physiotherapy is in progress.  Nebulizer treatments have been ordered. Oxygen requirements have worsened.  Currently on 7 L of oxygen by high flow nasal cannula.  He remains tenuous.  Continue to monitor closely.  ADDENDUM: Called by RN that patient was hypoxic and less responsive. Patient seen at bedside. Opens his eyes when name is called but goes right back to sleep. Receiving CPT. ABG ordered which shows Hypercapneic resp failure. Discussed with Dr. Erin Fulling. Will keep NPO and start Bipap. If no response then we may need to move to ICU. Cortrak can be ordered tomorrow.  Aspiration pneumonia/Pseudomonas infection Pseudomonas noted on tracheal aspirate cultures.  Remains on cefepime. Patient has been seen by speech therapy.  Underwent modified barium swallow yesterday.  On regular diet with nectar thick liquids.  Use aspiration precaution.  This is communicated to patient's wife as well.  She was told that he needs to be upright and fully awake before he is fed. Due to persistent encephalopathy cefepime will be changed over to Zosyn.  Segmental right lower lobe pulmonary embolism/previous history of  DVT and PE Was on warfarin prior to admission.  Currently on heparin and warfarin.  Subdural hematoma was thought to be a small and it was okay to continue  with anticoagulation based on critical care notes.  Subdural hematoma This was noted to be small in size.  This was discussed with neurosurgery.  No intervention was necessary.  He was cleared to take anticoagulation. Was initially placed on Keppra.  Now on Vimpat.  Normocytic anemia No evidence of overt blood loss.  Continue to monitor.  History of metastatic prostate cancer with mets to lungs Gets treatment at [facility].  Has been getting radiation treatments.  Can follow-up with his oncologist at discharge.  Hyponatremia Resolved  Diabetes mellitus type 2, controlled On metformin prior to admission.  Continue SSI.  Essential hypertension Noted to be on torsemide and metolazone prior to admission, both of which are currently on hold.  Blood pressure is reasonably well controlled.  Dilated small bowel loops Noted incidentally on CT scan.  Abdomen is benign.  Continue to monitor.  Indeterminate right renal lesion Will need outpatient MRI for same.  Severe protein calorie malnutrition Nutrition Problem: Severe Malnutrition Etiology: chronic illness, cancer and cancer related treatments  Signs/Symptoms: severe muscle depletion, severe fat depletion  Interventions: Hormel Shake, MVI, Liberalize Diet   DVT Prophylaxis: On heparin and Coumadin Code Status: Full code Family Communication: Discussed with patient's wife at bedside. Disposition Plan: To be determined.  Will likely need skilled nursing facility.  Status is: Inpatient  Remains inpatient appropriate because: Acute respiratory failure with hypoxia, acute encephalopathy     Medications: Scheduled:  albuterol  2.5 mg Nebulization Q4H   aspirin EC  81 mg Oral Daily   Chlorhexidine Gluconate Cloth  6 each Topical Q0600   docusate sodium  100 mg Oral BID   lacosamide  100 mg Oral BID   melatonin  5 mg Oral Once   multivitamin with minerals  1 tablet Oral Daily   pantoprazole  40 mg Oral Daily   polyethylene  glycol  17 g Oral Daily   warfarin  2 mg Oral ONCE-1600   Warfarin - Pharmacist Dosing Inpatient   Does not apply q1600   Continuous:  sodium chloride Stopped (06/11/2021 1831)   ceFEPime (MAXIPIME) IV 200 mL/hr at 06/14/21 0430   heparin 2,100 Units/hr (06/14/21 0600)   TDS:KAJGOT chloride  Antibiotics: Anti-infectives (From admission, onward)    Start     Dose/Rate Route Frequency Ordered Stop   06/12/21 1200  ceFEPIme (MAXIPIME) 2 g in sodium chloride 0.9 % 100 mL IVPB        2 g 200 mL/hr over 30 Minutes Intravenous Every 8 hours 06/12/21 1113     06/11/21 1200  cefTRIAXone (ROCEPHIN) 2 g in sodium chloride 0.9 % 100 mL IVPB  Status:  Discontinued        2 g 200 mL/hr over 30 Minutes Intravenous Every 24 hours 06/11/21 1025 06/12/21 1113   06/10/21 1600  cefTRIAXone (ROCEPHIN) 1 g in sodium chloride 0.9 % 100 mL IVPB  Status:  Discontinued        1 g 200 mL/hr over 30 Minutes Intravenous Every 24 hours 06/10/21 1025 06/11/21 1025   06/10/21 1600  azithromycin (ZITHROMAX) 500 mg in sodium chloride 0.9 % 250 mL IVPB  Status:  Discontinued        500 mg 250 mL/hr over 60 Minutes Intravenous Every 24 hours 06/10/21 1025 06/12/21 1113   06/16/2021 1615  cefTRIAXone (  ROCEPHIN) 1 g in sodium chloride 0.9 % 100 mL IVPB        1 g 200 mL/hr over 30 Minutes Intravenous  Once 06/06/2021 1605 06/13/2021 1716   06/20/2021 1615  azithromycin (ZITHROMAX) 500 mg in sodium chloride 0.9 % 250 mL IVPB        500 mg 250 mL/hr over 60 Minutes Intravenous  Once 06/02/2021 1605 06/07/2021 1750       Objective:  Vital Signs  Vitals:   06/14/21 0413 06/14/21 0500 06/14/21 0737 06/14/21 0832  BP:   114/65 114/65  Pulse:  99 96 86  Resp:   (!) 23 17  Temp:      TempSrc:      SpO2:  100% 100% 100%  Weight: 121.8 kg     Height:        Intake/Output Summary (Last 24 hours) at 06/14/2021 1002 Last data filed at 06/14/2021 0430 Gross per 24 hour  Intake 1374.61 ml  Output 650 ml  Net 724.61 ml     Filed Weights   06/08/2021 1241 06/14/21 0413  Weight: 108.9 kg 121.8 kg    General appearance: somnolent this morning Resp: Mildly tachypneic.  Coarse breath sound bilaterally.  Diminished air entry at the right base.  No wheezing appreciated. Cardio: S1-S2 is normal regular.  No S3-S4.  No rubs murmurs or bruit GI: Abdomen is soft.  Nontender nondistended.  Bowel sounds are present normal.  No masses organomegaly Extremities: Mild edema noted in the lower extremities Neurologic: No obvious focal neurological deficits appreciated   Lab Results:  Data Reviewed: I have personally reviewed following labs and imaging studies  CBC: Recent Labs  Lab 06/14/2021 1303 06/10/21 0713 06/10/21 0905 06/11/21 1034 06/12/21 0312 06/13/21 0309 06/14/21 0201  WBC 9.9 6.9  --  7.0 6.7 5.6 5.0  NEUTROABS 8.9*  --   --   --  6.1  --   --   HGB 12.7* 12.7* 13.3 10.7* 10.9* 10.7* 9.9*  HCT 41.1 43.8 39.0 34.8* 36.8* 35.7* 33.9*  MCV 95.4 100.2*  --  96.1 97.9 99.4 101.2*  PLT 333 279  --  230 235 210 200     Basic Metabolic Panel: Recent Labs  Lab 06/28/2021 1303 06/10/21 0713 06/10/21 0905 06/11/21 0756 06/12/21 0312 06/13/21 0309  NA 133* 135 134*  --  137 139  K 4.7 4.4 4.1  --  3.2* 3.9  CL 89* 91*  --   --  94* 95*  CO2 35* 37*  --   --  35* 38*  GLUCOSE 161* 120*  --   --  100* 121*  BUN 26* 20  --   --  14 10  CREATININE 0.78 0.78  --   --  0.71 0.55*  CALCIUM 9.6 9.8  --   --  8.8* 8.8*  MG  --  1.6*  --  2.0 1.8 1.8  PHOS  --  5.4*  --   --  2.8 2.5     GFR: Estimated Creatinine Clearance: 94.9 mL/min (A) (by C-G formula based on SCr of 0.55 mg/dL (L)).  Liver Function Tests: Recent Labs  Lab 06/25/2021 1303  AST 32  ALT 24  ALKPHOS 125  BILITOT 0.4  PROT 6.8  ALBUMIN 2.1*     Coagulation Profile: Recent Labs  Lab 06/25/2021 1303 06/10/21 0713 06/13/21 0309 06/14/21 0201  INR 8.8* 1.5* 2.5* 2.5*     CBG: Recent Labs  Lab 06/12/21 1621  06/12/21  1700 06/13/21 2120 06/13/21 2331 06/14/21 0401  GLUCAP 176* 185* 150* 121* 105*      Recent Results (from the past 240 hour(s))  Resp Panel by RT-PCR (Flu A&B, Covid) Nasopharyngeal Swab     Status: None   Collection Time: 06/05/2021  4:54 PM   Specimen: Nasopharyngeal Swab; Nasopharyngeal(NP) swabs in vial transport medium  Result Value Ref Range Status   SARS Coronavirus 2 by RT PCR NEGATIVE NEGATIVE Final    Comment: (NOTE) SARS-CoV-2 target nucleic acids are NOT DETECTED.  The SARS-CoV-2 RNA is generally detectable in upper respiratory specimens during the acute phase of infection. The lowest concentration of SARS-CoV-2 viral copies this assay can detect is 138 copies/mL. A negative result does not preclude SARS-Cov-2 infection and should not be used as the sole basis for treatment or other patient management decisions. A negative result may occur with  improper specimen collection/handling, submission of specimen other than nasopharyngeal swab, presence of viral mutation(s) within the areas targeted by this assay, and inadequate number of viral copies(<138 copies/mL). A negative result must be combined with clinical observations, patient history, and epidemiological information. The expected result is Negative.  Fact Sheet for Patients:  EntrepreneurPulse.com.au  Fact Sheet for Healthcare Providers:  IncredibleEmployment.be  This test is no t yet approved or cleared by the [place] FDA and  has been authorized for detection and/or diagnosis of SARS-CoV-2 by FDA under an Emergency Use Authorization (EUA). This EUA will remain  in effect (meaning this test can be used) for the duration of the COVID-19 declaration under Section 564(b)(1) of the Act, 21 U.S.C.section 360bbb-3(b)(1), unless the authorization is terminated  or revoked sooner.       Influenza A by PCR NEGATIVE NEGATIVE Final   Influenza B by PCR NEGATIVE  NEGATIVE Final    Comment: (NOTE) The Xpert Xpress SARS-CoV-2/FLU/RSV plus assay is intended as an aid in the diagnosis of influenza from Nasopharyngeal swab specimens and should not be used as a sole basis for treatment. Nasal washings and aspirates are unacceptable for Xpert Xpress SARS-CoV-2/FLU/RSV testing.  Fact Sheet for Patients: EntrepreneurPulse.com.au  Fact Sheet for Healthcare Providers: IncredibleEmployment.be  This test is not yet approved or cleared by the [place] FDA and has been authorized for detection and/or diagnosis of SARS-CoV-2 by FDA under an Emergency Use Authorization (EUA). This EUA will remain in effect (meaning this test can be used) for the duration of the COVID-19 declaration under Section 564(b)(1) of the Act, 21 U.S.C. section 360bbb-3(b)(1), unless the authorization is terminated or revoked.  Performed at [facility], [address] [place], [place] [address]   Culture, Respiratory w Gram Stain     Status: None (Preliminary result)   Collection Time: 06/10/21 10:52 AM   Specimen: Tracheal Aspirate; Respiratory  Result Value Ref Range Status   Specimen Description TRACHEAL ASPIRATE  Final   Special Requests NONE  Final   Gram Stain   Final    MODERATE WBC PRESENT,BOTH PMN AND MONONUCLEAR RARE GRAM POSITIVE COCCI FEW GRAM NEGATIVE RODS    Culture   Final    RARE PSEUDOMONAS AERUGINOSA CULTURE REINCUBATED FOR BETTER GROWTH Performed at [facility], [place] [address], [place], [place] [address]    Report Status PENDING  Incomplete       Radiology Studies: DG Chest Port 1 View  Result Date: 06/13/2021 CLINICAL DATA:  87 year old male with history of abnormal respiration. EXAM: PORTABLE CHEST - 1 VIEW COMPARISON:  06/12/2021,  06/11/2021 FINDINGS: The patient is rotated to the right. Similar appearing obscuration of the right cardiomediastinal silhouette and right costophrenic angle.  Slight interval increased near complete opacification of the right hemithorax with scattered aeration of the right upper lobe. Slight interval improvement and scattered hazy opacities most prominent about the left hilum. No evidence of pneumothorax. Similar appearing erosive changes of the proximal right second and third ribs. No acute osseous abnormality. IMPRESSION: 1. Slight interval worsening of near complete opacification of the right hemithorax, likely partially due to at least a moderate right pleural effusion. 2. Slight interval improved aeration of the left lung with scattered persistent hazy perihilar opacities. Electronically Signed   By: Ruthann Cancer M.D.   On: 06/13/2021 08:05   DG Swallowing Func-Speech Pathology  Result Date: 06/13/2021 Table formatting from the original result was not included. Objective Swallowing Evaluation: Type of Study: MBS-Modified Barium Swallow Study  Patient Details Name: Shadoe Bethel MRN: [id number] Date of Birth: 08-31-1933 Today's Date: 06/13/2021 Time: SLP Start Time (ACUTE ONLY): 0845 -SLP Stop Time (ACUTE ONLY): 0905 SLP Time Calculation (min) (ACUTE ONLY): 20 min Past Medical History: Past Medical History: Diagnosis Date  Diabetes mellitus without complication ([place])   DVT (deep venous thrombosis) ([facility])   Hypertension   MI (myocardial infarction) ([place])   Pulmonary embolism ([place])  Past Surgical History: Past Surgical History: Procedure Laterality Date  CHOLECYSTECTOMY    REPLACEMENT TOTAL KNEE BILATERAL    VENA CAVA FILTER PLACEMENT   HPI: Patient is a 87 yo M w/ pertinent pmh of CAD s/p MI, DVT/PE on Coumadin, T2DM, HTN, metastatic prostate cancer w/ mets to lungs on active radiation presents [facility] on 12/8 after mechanical fall.  CT head showed 5 mm parafalcine subdural hematoma. CXR shows possible RLL pneumonia; given ceftriaxone/azithromycin. Rn has observed pt to cough with large sips of water.  No data recorded  Recommendations for follow up therapy are one  component of a multi-disciplinary discharge planning process, led by the attending physician.  Recommendations may be updated based on patient status, additional functional criteria and insurance authorization. Assessment / Plan / Recommendation Clinical Impressions 06/13/2021 Clinical Impression Pt demonstrates a moderate dysphagia, appearing to be exacerbated by lethargy and weakness.Oral phase is adequate, but there is a slight delay in swallow initiation and full airway protection as well as weakness of base of tongue. Initially pt tolerated straw sips of thin well, but one large sip resulted in spillage into the airway with weak cough response that pt never fully cleared and subsequent attempts to regulate bolus size still resulted in further penetration events that contributed to barium in the vestibule and airway. Nectar thick liquids given in smaller sips did not result in penetration or aspiration, though vallecular residual with nectar and solids were present. A cued effortful swallow or second swallow benefitted pt. Recommend regular solids and nectar thick liquids during rehabilitation. Pharyngeal strengthening and respiratory muscle strength training for more effective cough may be beneficial as well as behavioral strategies to limit to single cup sips. Recommend AIR at d/c for intensive rehabiltation and eventual diet upgrade. SLP Visit Diagnosis Dysphagia, pharyngeal phase (R13.13) Attention and concentration deficit following -- Frontal lobe and executive function deficit following -- Impact on safety and function Moderate aspiration risk   Treatment Recommendations 06/13/2021 Treatment Recommendations Therapy as outlined in treatment plan below   Prognosis 06/13/2021 Prognosis for Safe Diet Advancement Good Barriers to Reach Goals Cognitive deficits Barriers/Prognosis Comment -- Diet Recommendations 06/13/2021 SLP Diet Recommendations Regular  solids;Nectar thick liquid Liquid Administration via  Cup;No straw Medication Administration Whole meds with puree Compensations Slow rate;Small sips/bites;Effortful swallow;Multiple dry swallows after each bite/sip Postural Changes Remain semi-upright after after feeds/meals (Comment);Seated upright at 90 degrees   Other Recommendations 06/13/2021 Recommended Consults -- Oral Care Recommendations Oral care BID Other Recommendations -- Follow Up Recommendations Acute inpatient rehab (3hours/day) Assistance recommended at discharge Frequent or constant Supervision/Assistance Functional Status Assessment Patient has had a recent decline in their functional status and demonstrates the ability to make significant improvements in function in a reasonable and predictable amount of time. Frequency and Duration  06/13/2021 Speech Therapy Frequency (ACUTE ONLY) min 2x/week Treatment Duration 2 weeks   Oral Phase 06/13/2021 Oral Phase WFL Oral - Pudding Teaspoon -- Oral - Pudding Cup -- Oral - Honey Teaspoon -- Oral - Honey Cup -- Oral - Nectar Teaspoon -- Oral - Nectar Cup -- Oral - Nectar Straw -- Oral - Thin Teaspoon -- Oral - Thin Cup -- Oral - Thin Straw -- Oral - Puree -- Oral - Mech Soft -- Oral - Regular -- Oral - Multi-Consistency -- Oral - Pill -- Oral Phase - Comment --  Pharyngeal Phase 06/13/2021 Pharyngeal Phase Impaired Pharyngeal- Pudding Teaspoon -- Pharyngeal -- Pharyngeal- Pudding Cup -- Pharyngeal -- Pharyngeal- Honey Teaspoon -- Pharyngeal -- Pharyngeal- Honey Cup -- Pharyngeal -- Pharyngeal- Nectar Teaspoon Reduced tongue base retraction;Pharyngeal residue - valleculae Pharyngeal -- Pharyngeal- Nectar Cup -- Pharyngeal -- Pharyngeal- Nectar Straw Reduced tongue base retraction;Pharyngeal residue - valleculae Pharyngeal -- Pharyngeal- Thin Teaspoon -- Pharyngeal -- Pharyngeal- Thin Cup Reduced tongue base retraction;Pharyngeal residue - valleculae;Penetration/Aspiration before swallow;Delayed swallow initiation-pyriform sinuses Pharyngeal Material enters  airway, remains ABOVE vocal cords and not ejected out;Material does not enter airway Pharyngeal- Thin Straw Penetration/Aspiration before swallow;Delayed swallow initiation-pyriform sinuses;Pharyngeal residue - valleculae;Reduced tongue base retraction Pharyngeal Material enters airway, passes BELOW cords and not ejected out despite cough attempt by patient;Material does not enter airway Pharyngeal- Puree -- Pharyngeal -- Pharyngeal- Mechanical Soft -- Pharyngeal -- Pharyngeal- Regular -- Pharyngeal -- Pharyngeal- Multi-consistency -- Pharyngeal -- Pharyngeal- Pill -- Pharyngeal -- Pharyngeal Comment --  No flowsheet data found. DeBlois, Katherene Ponto 06/13/2021, 10:06 AM                         LOS: 5 days   [facility] Pager on [email or web address]  06/14/2021, 10:02 AM

## 2021-06-14 NOTE — Progress Notes (Signed)
NAME:  Adrian Foster, MRN:  [id number], DOB:  1934/01/08, LOS: 5 ADMISSION DATE:  06/08/2021, CONSULTATION DATE:  06/13/2021 REFERRING MD:  Dr. Maryland Pink, CHIEF COMPLAINT:  Pleural Effusion   History of Present Illness:  87 year old male presents to [facility] on 12/8 after mechanical fall the night prior on coumadin oncologist recommended he come to ED.   On arrival to [facility] ED on 12/8, patient was found to be hypoxic in 70s on room air. Sats improved with supplemental O2. Patient has been taking Coumadin for subsegmental PE outpatient. INR was 8.8. CT chest shows PE within segmental and subsegmental branches on RLL. CT head showed 5 mm parafalcine subdural hematoma. Neurosurgery consulted and recommend no surgery and recommend not severe enough to reverse anticoagulation. Vitamin K given to improve INR to allow [place] to start patient on heparin drip. CXR shows possible RLL pneumonia; given ceftriaxone/azithromycin. 12/9 intubated for encephalopathy and hypoxemic/hypercapnic respiratory failure. Extubated 12/10.  Patient transferred out of ICU 12/11. 12/12 pulmonary consulted for evaluation of worsening right hemithorax with near complete opacification.   Pertinent  Medical History  CAD s/p MI, DVT/PE on Coumadin, T2DM, HTN, metastatic prostate cancer w/ mets to lungs on active radiation  Significant Hospital Events: Including procedures, antibiotic start and stop dates in addition to other pertinent events   12/8: admitted to [facility] on 12/8 for hypoxia 12/9 early morning intubated for encephalopathy and hypoxemic/hypercapnic respiratory failure 12/10 extubated   Interim History / Subjective:  No acute events overnight. This AM patient more somnolent.  Wife at the bedside.   Objective   Blood pressure 114/65, pulse 86, temperature 98.1 F (36.7 C), temperature source Axillary, resp. rate 17, height 6\' 3"  (1.905 m), weight 121.8 kg, SpO2 100 %.        Intake/Output Summary (Last 24 hours) at 06/14/2021  1125 Last data filed at 06/14/2021 0430 Gross per 24 hour  Intake 1374.61 ml  Output 650 ml  Net 724.61 ml   Filed Weights   06/29/2021 1241 06/14/21 0413  Weight: 108.9 kg 121.8 kg    Examination: General: ill appearing elderly male, lying in bed HENT: moist mucous membranes, sclera anicteric, downward gaze Lungs: diminished right base. No wheezing or rhonchi Cardiovascular: irregular, no murmur Abdomen: Soft, non-tender, active bowel sounds  Extremities: no edema Neuro: somnolent, intermittently follows commands GU: external foley cath in place   Resolved Hospital Problem list     Assessment & Plan:   Acute Hypoxic/Hypercarbic Respiratory Failure in setting pseudomonas pneumonia and aspiration pneumonia, +Segmental/Subsegmental RLL PE  Plan - Will change cefepime to zosyn due to possible neurologic side effects - Continue nebulizer treatments, flutter valve and chest physiotherapy for mucous clearance. - Aspiration precautions - heparin gtt for PE  Subdural Hematoma  Concern for Seizure > transient episode of decreased responsiveness and right upper extremity jerking  Plan -Neurosurgery Following  -Neurology Following > repeat CT head today   Best Practice (right click and "Reselect all SmartList Selections" daily)   Remaining management per primary team.   Labs   CBC: Recent Labs  Lab 06/22/2021 1303 06/10/21 0713 06/10/21 0905 06/11/21 1034 06/12/21 0312 06/13/21 0309 06/14/21 0201  WBC 9.9 6.9  --  7.0 6.7 5.6 5.0  NEUTROABS 8.9*  --   --   --  6.1  --   --   HGB 12.7* 12.7* 13.3 10.7* 10.9* 10.7* 9.9*  HCT 41.1 43.8 39.0 34.8* 36.8* 35.7* 33.9*  MCV 95.4 100.2*  --  96.1 97.9  99.4 101.2*  PLT 333 279  --  230 235 210 412    Basic Metabolic Panel: Recent Labs  Lab 06/30/2021 1303 06/10/21 0713 06/10/21 0905 06/11/21 0756 06/12/21 0312 06/13/21 0309  NA 133* 135 134*  --  137 139  K 4.7 4.4 4.1  --  3.2* 3.9  CL 89* 91*  --   --  94* 95*  CO2  35* 37*  --   --  35* 38*  GLUCOSE 161* 120*  --   --  100* 121*  BUN 26* 20  --   --  14 10  CREATININE 0.78 0.78  --   --  0.71 0.55*  CALCIUM 9.6 9.8  --   --  8.8* 8.8*  MG  --  1.6*  --  2.0 1.8 1.8  PHOS  --  5.4*  --   --  2.8 2.5   GFR: Estimated Creatinine Clearance: 94.9 mL/min (A) (by C-G formula based on SCr of 0.55 mg/dL (L)). Recent Labs  Lab 06/11/21 1034 06/12/21 0312 06/13/21 0309 06/14/21 0201  WBC 7.0 6.7 5.6 5.0    Liver Function Tests: Recent Labs  Lab 06/29/2021 1303  AST 32  ALT 24  ALKPHOS 125  BILITOT 0.4  PROT 6.8  ALBUMIN 2.1*   No results for input(s): LIPASE, AMYLASE in the last 168 hours. No results for input(s): AMMONIA in the last 168 hours.  ABG    Component Value Date/Time   PHART 7.515 (H) 06/10/2021 0905   PCO2ART 50.8 (H) 06/10/2021 0905   PO2ART 414 (H) 06/10/2021 0905   HCO3 41.2 (H) 06/10/2021 0905   TCO2 43 (H) 06/10/2021 0905   O2SAT 100.0 06/10/2021 0905     Coagulation Profile: Recent Labs  Lab 06/08/2021 1303 06/10/21 0713 06/13/21 0309 06/14/21 0201  INR 8.8* 1.5* 2.5* 2.5*    Cardiac Enzymes: No results for input(s): CKTOTAL, CKMB, CKMBINDEX, TROPONINI in the last 168 hours.  HbA1C: No results found for: HGBA1C  CBG: Recent Labs  Lab 06/12/21 1621 06/12/21 1700 06/13/21 2120 06/13/21 2331 06/14/21 0401  GLUCAP 176* 185* 150* 121* 105*    Freda Jackson, MD [facility] Office: [phone]   See Amion for personal pager PCCM on call pager [phone] until 7pm. Please call Elink 7p-7a. [phone]

## 2021-06-14 NOTE — Progress Notes (Signed)
Physical Therapy Treatment Patient Details Name: Adrian Foster MRN: [id number] DOB: 01-16-1934 Today's Date: 06/14/2021   History of Present Illness Patient is a 87 yo M presents to [facility] on 12/8 after mechanical fall.  CT head showed 5 mm parafalcine subdural hematoma. Found to be hypoxic with transient episodes of responsiveness once tx to ICU. CXR- RLL PNA, Chest CT- PEs. Intubated 12/9-12/10.  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    PT Comments    Pt received in supine, spouse present, pt lethargic and unable to answer orientation questions. Pt needing increased assist (totalA +2) for rolling and bed mobility and minimally able to support trunk while seated (needs modA external support to maintain upright). Emphasis on importance of continued mobility, supine LE/UE exercises for ROM/strengthening, pressure relief strategies, bed mobility and balance strategies. Co-treatment with OT due to pt multidisciplinary therapy needs and significant lethargy for pt/staff safety. Pt continues to benefit from PT services to progress toward functional mobility goals.     Recommendations for follow up therapy are one component of a multi-disciplinary discharge planning process, led by the attending physician.  Recommendations may be updated based on patient status, additional functional criteria and insurance authorization.  Follow Up Recommendations  Acute inpatient rehab (3hours/day)     Assistance Recommended at Discharge Frequent or constant Supervision/Assistance  Equipment Recommendations  Other (comment) (TBA)    Recommendations for Other Services       Precautions / Restrictions Precautions Precautions: Fall Precaution Comments: keep SBP <160, large hematoma on sacrum Restrictions Weight Bearing Restrictions: No     Mobility  Bed Mobility Overal bed mobility: Needs Assistance Bed Mobility: Rolling;Sidelying to  Sit;Sit to Sidelying Rolling: Total assist;+2 for physical assistance Sidelying to sit: Total assist;+2 for physical assistance;HOB elevated     Sit to sidelying: Total assist;+2 for physical assistance General bed mobility comments: pt with no initiation despite max cues due to lethargy, needs hand over hand assist to roll but not pulling up on side rail to assist, +2 totalA for BLE and trunk to log roll to L EOB.    Transfers                   General transfer comment: pt too lethargic to attempt, not following cues for seated scooting and needs external support for seated balance/trunk.    Ambulation/Gait                   Stairs             Wheelchair Mobility    Modified Rankin (Stroke Patients Only) Modified Rankin (Stroke Patients Only) Pre-Morbid Rankin Score: Moderate disability Modified Rankin: Severe disability     Balance Overall balance assessment: Needs assistance Sitting-balance support: Feet supported;Bilateral upper extremity supported Sitting balance-Leahy Scale: Poor Sitting balance - Comments: Pt c/o increased discomfort when leaning to L side/WB through L hip (hematoma on sacrum); constant modA for trunk support while seated Postural control: Right lateral lean                                  Cognition Arousal/Alertness: Lethargic;Suspect due to medications Behavior During Therapy: Flat affect Overall Cognitive Status: Impaired/Different from baseline Area of Impairment: Orientation;Memory;Following commands;Safety/judgement;Problem solving;Attention;Awareness                 Orientation Level:  (pt too  lethargic to answer orientation questions or state his name) Current Attention Level: Focused   Following Commands: Follows one step commands with increased time;Follows one step commands inconsistently Safety/Judgement: Decreased awareness of deficits;Decreased awareness of safety Awareness:  Intellectual Problem Solving: Slow processing;Decreased initiation;Difficulty sequencing;Requires verbal cues;Requires tactile cues General Comments: pt maintains eyes closed, only opens 1 eye briefly (about a second) when cued, only opens both eyes once despite max cues and positional change to upright. Poor initiation and participation this date. Spouse present in room initially but left room during session.        Exercises General Exercises - Upper Extremity Shoulder Flexion: PROM;Both;Supine;Seated (x2 reps ea position) General Exercises - Lower Extremity Ankle Circles/Pumps: PROM;AAROM;Both;10 reps;Supine Heel Slides: PROM;Both;5 reps;Supine Other Exercises Other Exercises: bed in chair position: PROM SAQ x5 reps ea    General Comments General comments (skin integrity, edema, etc.): pt on continuous EEG monitoring, no overt seizure but non-verbal seated EOB will nod/shake head to max cues/questions and minor tremors at times in UE/LE unclear if seizure activity so did not press button; BP 100/64 auto checked while seated EOB, HR 94 bpm and SpO2 100% on 6.5L HF [place] throughout; RR 23 rpm while seated      Pertinent Vitals/Pain Pain Assessment: Faces Faces Pain Scale: Hurts little more Pain Location: when leaning toward L side/hip while seated (likely due to hematoma, pt unable to localize pain) Pain Descriptors / Indicators: Grimacing;Moaning Pain Intervention(s): Limited activity within patient's tolerance;Monitored during session;Repositioned    Home Living                          Prior Function            PT Goals (current goals can now be found in the care plan section) Acute Rehab PT Goals Patient Stated Goal: per wife, get back to independence PT Goal Formulation: With patient Time For Goal Achievement: 06/26/21 Progress towards PT goals: Progressing toward goals    Frequency    Min 4X/week      PT Plan Current plan remains appropriate (pending  progress)    Co-evaluation PT/OT/SLP Co-Evaluation/Treatment: Yes Reason for Co-Treatment: Complexity of the patient's impairments (multi-system involvement);Necessary to address cognition/behavior during functional activity;For patient/therapist safety;To address functional/ADL transfers PT goals addressed during session: Mobility/safety with mobility;Strengthening/ROM;Balance        AM-PAC PT "6 Clicks" Mobility   Outcome Measure  Help needed turning from your back to your side while in a flat bed without using bedrails?: Total Help needed moving from lying on your back to sitting on the side of a flat bed without using bedrails?: Total Help needed moving to and from a bed to a chair (including a wheelchair)?: Total Help needed standing up from a chair using your arms (e.g., wheelchair or bedside chair)?: Total Help needed to walk in hospital room?: Total Help needed climbing 3-5 steps with a railing? : Total 6 Click Score: 6    End of Session Equipment Utilized During Treatment: Gait belt;Oxygen Activity Tolerance: Patient limited by lethargy Patient left: in bed;with call bell/phone within reach;with family/visitor present;with bed alarm set;with SCD's reapplied (heels floated, bed in chair position although spouse requesting to keep his LE elevated/flat and not down) Nurse Communication: Mobility status;Other (comment) (pt lethargy) PT Visit Diagnosis: Hemiplegia and hemiparesis;Unsteadiness on feet (R26.81);Difficulty in walking, not elsewhere classified (R26.2) Hemiplegia - Right/Left: Right Hemiplegia - dominant/non-dominant: Dominant Hemiplegia - caused by: Cerebral infarction  Time: 7471-5953 PT Time Calculation (min) (ACUTE ONLY): 28 min  Charges:  $Therapeutic Activity: 8-22 mins                     Mayla Biddy P., PTA Acute Rehabilitation Services Pager: [phone] Office: [place] 06/14/2021, 12:27 PM

## 2021-06-14 NOTE — Progress Notes (Signed)
ANTICOAGULATION CONSULT NOTE - Follow Up Consult  Pharmacy Consult for IV Heparin + warfarin Indication: pulmonary embolus  Allergies  Allergen Reactions   Azithromycin Nausea And Vomiting   Levofloxacin Diarrhea    Patient Measurements: Height: 6\' 3"  (190.5 cm) Weight: 108.9 kg (240 lb) IBW/kg (Calculated) : 84.5 Heparin Dosing Weight: 106.6 kg  Vital Signs: Temp: 97.8 F (36.6 C) (12/12 2329) Temp Source: Axillary (12/12 2329) BP: 104/53 (12/12 2329) Pulse Rate: 96 (12/12 2329)  Labs: Recent Labs    06/12/21 0312 06/12/21 1229 06/13/21 0309 06/14/21 0201  HGB 10.9*  --  10.7* 9.9*  HCT 36.8*  --  35.7* 33.9*  PLT 235  --  210 200  LABPROT  --   --  26.6* 27.0*  INR  --   --  2.5* 2.5*  HEPARINUNFRC 0.22* 0.30 0.34 0.23*  CREATININE 0.71  --  0.55*  --      Estimated Creatinine Clearance: 90 mL/min (A) (by C-G formula based on SCr of 0.55 mg/dL (L)).   Assessment: 87 years of age male with metastatic prostate cancer receiving radiation and on chronic warfarin therapy for history of DVT and PE who was admitted post fall with new small SDH. Vitamin K 5mg  IV given x1 for INR of 8.8 on admission 12/8. INR trended down to 1.5. CT Angio showing multiple small pulmonary embolisms in segmental and subsegmental right lower lobe. Pharmacy consulted to start IV Heparin 12/9. No surgical intervention of small SDH recommended at this time. CCM made decision to continue with warfarin - consulted Pharmacy to resume 12/11. PTA warfarin dose: 5mg  MWF and 2.5mg  all other days. -INR= 2.5, hg= 9.9 -heparin level= 0.23 on 1900 units/hr -warfarin was not given 12/12   Goal of Therapy:  Heparin level 0.3 to 0.5 units/ml Monitor platelets by anticoagulation protocol: Yes   Plan:  -Increase heparin to 2100 units/hr -heparin level in 8 hrs -Warfarin 2mg  today -Daily PT/INR  Hildred Laser, PharmD Clinical Pharmacist **Pharmacist phone directory can now be found on [email or web address] ([email or web address]  [email or web address]).  Listed under [place].

## 2021-06-14 NOTE — Progress Notes (Signed)
Patient more sedated and confused upon assessment. Patient is not awake enough or able to follow commands to safely take po meds. Neurologist is changing seziure medications to 1/2. That was successfully taken by patient. Will notify other medical team that meds were held for safety of patient. RN will continue to closely monitor.

## 2021-06-14 NOTE — Progress Notes (Signed)
Pharmacy Antibiotic Note  Adrian Foster is a 87 y.o. male admitted on 07/01/2021 with pneumonia.  Pharmacy has been consulted for zosyn dosing.  Plan: Zosyn 4.5 grams IV x 1 dose followed by Zosyn 3.375g IV q8h (4 hour infusion).  Height: 6\' 3"  (190.5 cm) Weight: 121.8 kg (268 lb 8.3 oz) IBW/kg (Calculated) : 84.5  Temp (24hrs), Avg:98.2 F (36.8 C), Min:97.8 F (36.6 C), Max:98.7 F (37.1 C)  Recent Labs  Lab 06/03/2021 1303 06/10/21 0713 06/11/21 1034 06/12/21 0312 06/13/21 0309 06/14/21 0201  WBC 9.9 6.9 7.0 6.7 5.6 5.0  CREATININE 0.78 0.78  --  0.71 0.55*  --     Estimated Creatinine Clearance: 94.9 mL/min (A) (by C-G formula based on SCr of 0.55 mg/dL (L)).    Allergies  Allergen Reactions   Azithromycin Nausea And Vomiting   Levofloxacin Diarrhea    Antimicrobials this admission: 12/8 azith >>12/11 12/9 CTX >> 12/11 12/11 Cefepime >> 12/13 12/13 Zosyn >>    Microbiology results: 12/9 Trach asp: rare GPC, Few GNB, rare pseudomonas   Thank you for allowing pharmacy to be a part of this patients care.  Vaughan Basta BS, PharmD, BCPS Clinical Pharmacist 06/14/2021 12:12 PM

## 2021-06-14 NOTE — Progress Notes (Signed)
Subjective: No seizures overnight.  However patient is more lethargic now per wife at bedside  ROS: Unable to obtain due to poor mental status  Examination  Vital signs in last 24 hours: Temp:  [97.8 F (36.6 C)-98.7 F (37.1 C)] 98.1 F (36.7 C) (12/13 0400) Pulse Rate:  [85-102] 95 (12/13 1244) Resp:  [17-27] 27 (12/13 1244) BP: (98-114)/(51-65) 100/64 (12/13 1155) SpO2:  [93 %-100 %] 93 % (12/13 1244) Weight:  [121.8 kg] 121.8 kg (12/13 0413)  General: lying in bed, NAD CVS: pulse-normal rate and rhythm RS: breathing comfortably, CTAB Extremities: warm, no edema   Neuro: MS: Opens eyes to command, oriented to place and person, follows commands CN: pupils equal and reactive,  EOMI, face symmetric, tongue midline, normal sensation over face, Motor: Antigravity strength in all extremities with immediate drift to bed  Basic Metabolic Panel: Recent Labs  Lab 06/18/2021 1303 06/10/21 0713 06/10/21 0905 06/11/21 0756 06/12/21 0312 06/13/21 0309  NA 133* 135 134*  --  137 139  K 4.7 4.4 4.1  --  3.2* 3.9  CL 89* 91*  --   --  94* 95*  CO2 35* 37*  --   --  35* 38*  GLUCOSE 161* 120*  --   --  100* 121*  BUN 26* 20  --   --  14 10  CREATININE 0.78 0.78  --   --  0.71 0.55*  CALCIUM 9.6 9.8  --   --  8.8* 8.8*  MG  --  1.6*  --  2.0 1.8 1.8  PHOS  --  5.4*  --   --  2.8 2.5    CBC: Recent Labs  Lab 06/12/2021 1303 06/10/21 0713 06/10/21 0905 06/11/21 1034 06/12/21 0312 06/13/21 0309 06/14/21 0201  WBC 9.9 6.9  --  7.0 6.7 5.6 5.0  NEUTROABS 8.9*  --   --   --  6.1  --   --   HGB 12.7* 12.7* 13.3 10.7* 10.9* 10.7* 9.9*  HCT 41.1 43.8 39.0 34.8* 36.8* 35.7* 33.9*  MCV 95.4 100.2*  --  96.1 97.9 99.4 101.2*  PLT 333 279  --  230 235 210 200     Coagulation Studies: Recent Labs    06/13/21 0309 06/14/21 0201  LABPROT 26.6* 27.0*  INR 2.5* 2.5*    Imaging No new brain imaging overnight   ASSESSMENT AND PLAN: 87 year old male with transient episode of  decreased responsiveness in the setting of a parafalcine subdural hematoma.     Transient alteration of awareness Acute encephalopathy - ddx include seizure vs due to hypooxygenation. Patient is at higher risk of seizures due to SDH.  -Encephalopathy likely due to medications, cefepime neurotoxicity   Recommendation -Continue LTM EEG to look for intermittent seizures -We will reduce Vimpat to 50 mg p.o. twice daily to minimize sedation -We will obtain CT head without contrast to assess for any change in the subdural hematoma -Will discuss with medicine team to see if we can switch cefepime to a different antibiotic - continue seizure precautions - management of rest of comorbidities per primary team    I have spent a total of 37 minutes with the patient reviewing hospital notes,  test results, labs and examining the patient as well as establishing an assessment and plan that was discussed personally with the patient's wife at bedside.  > 50% of time was spent in direct patient care.  Zeb Comfort Epilepsy [facility] For questions after 5pm please refer to  AMION to reach the Neurologist on call

## 2021-06-14 NOTE — Procedures (Addendum)
Patient Name: Adrian Foster  MRN: [id number]  Epilepsy Attending: Lora Havens  Referring Physician/Provider: Dr Zeb Comfort Duration: 06/13/2021 1241 to 06/14/2021 1241   Patient history:  87 year old male with transient episode of decreased responsiveness in the setting of a parafalcine subdural hematoma. EEG to evaluate for seizure   Level of alertness: Awake, asleep   AEDs during EEG study: LCM   Technical aspects: This EEG study was done with scalp electrodes positioned according to the 10-20 International system of electrode placement. Electrical activity was acquired at a sampling rate of 500Hz  and reviewed with a high frequency filter of 70Hz  and a low frequency filter of 1Hz . EEG data were recorded continuously and digitally stored.    Description: The posterior dominant rhythm consists of 8-9 Hz activity of moderate voltage (25-35 uV) seen predominantly in posterior head regions, symmetric and reactive to eye opening and eye closing. Sleep was characterized by vertex waves, sleep spindles (12 to 14 Hz), maximal frontocentral region. EEG showed continuous generalized 3 to 6 Hz theta-delta slowing, at times with triphasic morphology. Hyperventilation and photic stimulation were not performed.      ABNORMALITY -Continuous slow, generalized   IMPRESSION: This study is suggestive of moderate diffuse encephalopathy, nonspecific etiology but could be secondary to toxic -metabolic causes. No seizures or definite epileptiform discharges were seen throughout the recording.   Erinne Gillentine Barbra Sarks

## 2021-06-15 ENCOUNTER — Inpatient Hospital Stay (HOSPITAL_COMMUNITY): Payer: Medicare Other

## 2021-06-15 LAB — GLUCOSE, CAPILLARY
Glucose-Capillary: 94 mg/dL (ref 70–99)
Glucose-Capillary: 96 mg/dL (ref 70–99)
Glucose-Capillary: 99 mg/dL (ref 70–99)
Glucose-Capillary: 99 mg/dL (ref 70–99)

## 2021-06-15 LAB — BASIC METABOLIC PANEL
Anion gap: 7 (ref 5–15)
BUN: 10 mg/dL (ref 8–23)
CO2: 41 mmol/L — ABNORMAL HIGH (ref 22–32)
Calcium: 8.4 mg/dL — ABNORMAL LOW (ref 8.9–10.3)
Chloride: 92 mmol/L — ABNORMAL LOW (ref 98–111)
Creatinine, Ser: 0.64 mg/dL (ref 0.61–1.24)
GFR, Estimated: >60 mL/min (ref >60)
Glucose, Bld: 96 mg/dL (ref 70–99)
Potassium: 3.6 mmol/L (ref 3.5–5.1)
Sodium: 140 mmol/L (ref 135–145)

## 2021-06-15 LAB — CULTURE, RESPIRATORY W GRAM STAIN

## 2021-06-15 LAB — CBC
HCT: 31.6 % — ABNORMAL LOW (ref 39.0–52.0)
Hemoglobin: 9.4 g/dL — ABNORMAL LOW (ref 13.0–17.0)
MCH: 29.7 pg (ref 26.0–34.0)
MCHC: 29.7 g/dL — ABNORMAL LOW (ref 30.0–36.0)
MCV: 100 fL (ref 80.0–100.0)
Platelets: 198 10*3/uL (ref 150–400)
RBC: 3.16 MIL/uL — ABNORMAL LOW (ref 4.22–5.81)
RDW: 16.1 % — ABNORMAL HIGH (ref 11.5–15.5)
WBC: 5.4 10*3/uL (ref 4.0–10.5)
nRBC: 0 % (ref 0.0–0.2)

## 2021-06-15 LAB — PROTIME-INR
INR: 3.8 — ABNORMAL HIGH (ref 0.8–1.2)
Prothrombin Time: 37.4 seconds — ABNORMAL HIGH (ref 11.4–15.2)

## 2021-06-15 IMAGING — DX DG CHEST 1V PORT
1 series · 1 of 1 positions shown · non-contrast
Comparison: [DATE]

CLINICAL DATA: Pneumonia

EXAM:
PORTABLE CHEST 1 VIEW

[chest ap]
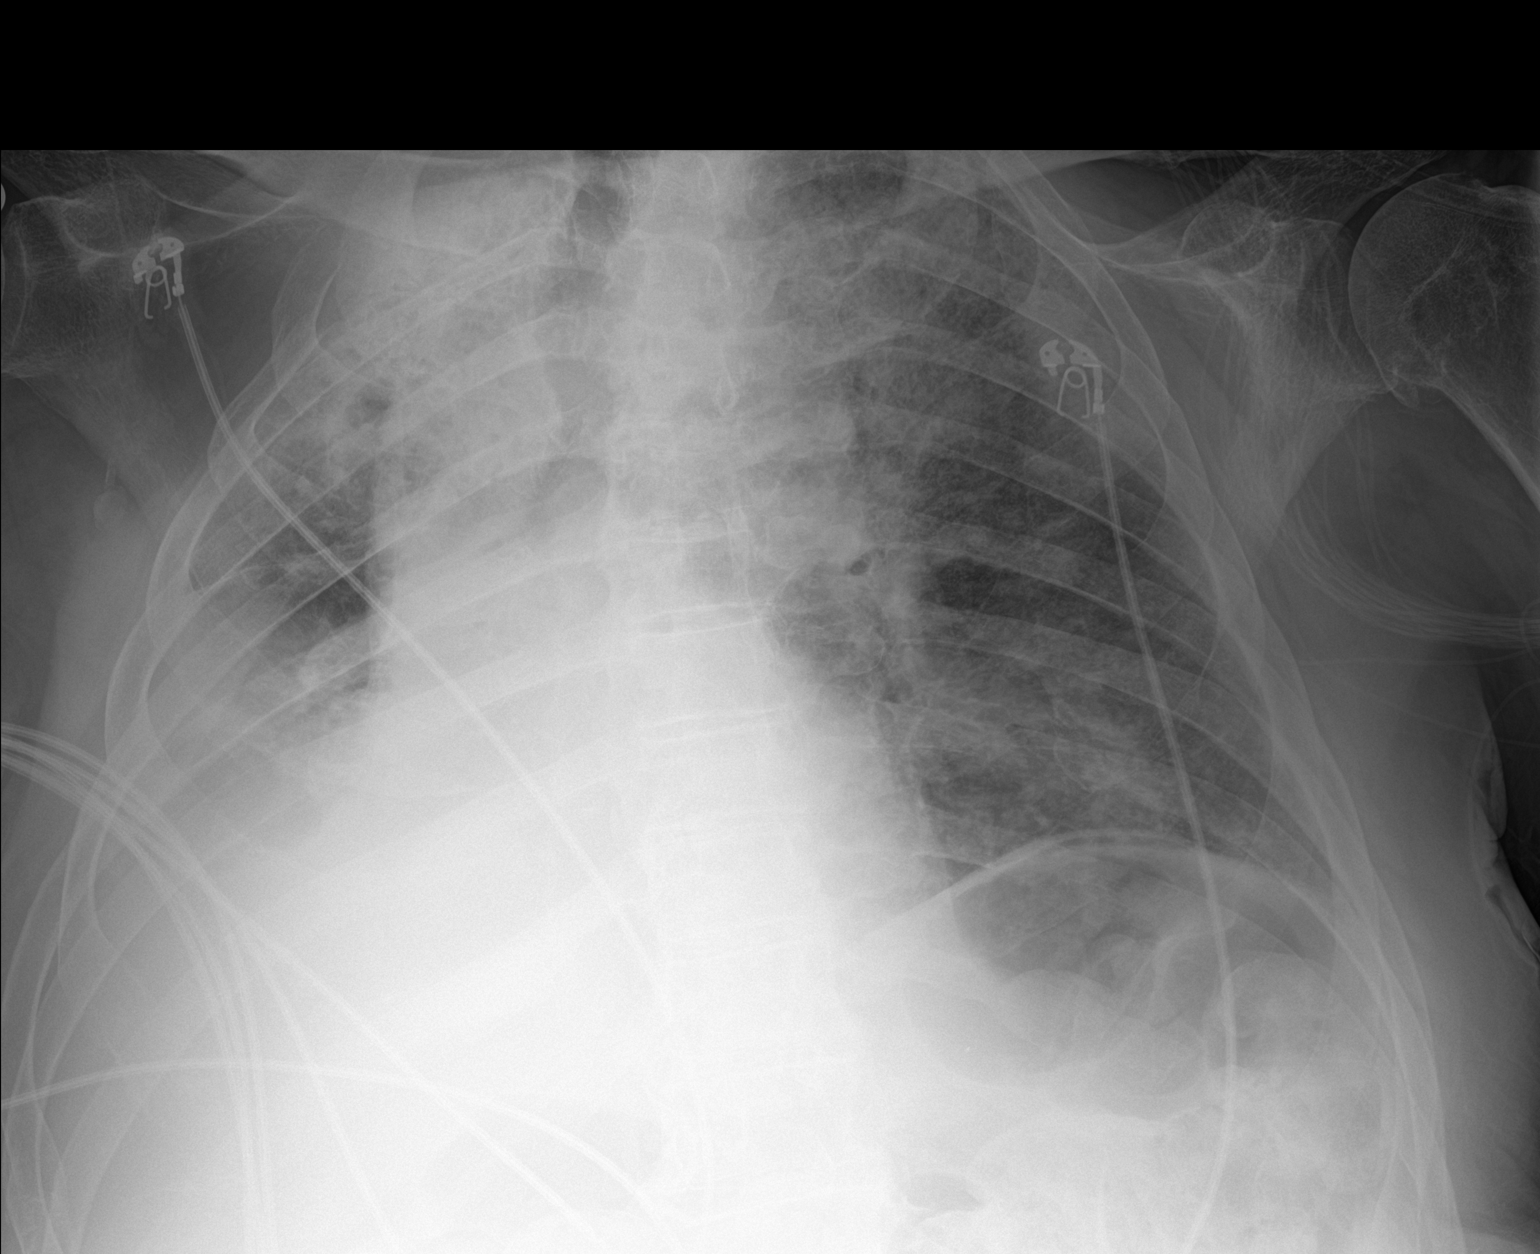

[1 of 1 positions shown; findings below may reference images not displayed]

FINDINGS: Interval improvement in aeration of the left lung. Significant
opacities still remains in the medial left upper lobe. Near complete
opacification of the right thorax is not significantly changed since
prior examination.
IMPRESSION: Interval improvement of aeration of the left lung with airspace
opacities still remaining in the medial upper lobe. Unchanged near
complete opacification of the right hemithorax.

## 2021-06-15 MED ORDER — HALOPERIDOL LACTATE 5 MG/ML IJ SOLN
2.0000 mg | Freq: Four times a day (QID) | INTRAMUSCULAR | Status: DC | PRN
  Filled 2021-06-15: qty 1

## 2021-06-15 NOTE — Procedures (Addendum)
Patient Name: Adrian Foster  MRN: [id number]  Epilepsy Attending: Lora Havens  Referring Physician/Provider: Dr Zeb Comfort Duration: 06/14/2021 1241 to 06/15/2021 0932   Patient history:  87 year old male with transient episode of decreased responsiveness in the setting of a parafalcine subdural hematoma. EEG to evaluate for seizure   Level of alertness: lethargic   AEDs during EEG study: LCM   Technical aspects: This EEG study was done with scalp electrodes positioned according to the 10-20 International system of electrode placement. Electrical activity was acquired at a sampling rate of 500Hz  and reviewed with a high frequency filter of 70Hz  and a low frequency filter of 1Hz . EEG data were recorded continuously and digitally stored.    Description: EEG showed continuous generalized 3 to 6 Hz theta-delta slowing, at times with triphasic morphology. Hyperventilation and photic stimulation were not performed.     Of note, parts of study were difficult to interpret due to significant electrode artifact.   ABNORMALITY -Continuous slow, generalized   IMPRESSION: This study is suggestive of moderate diffuse encephalopathy, nonspecific etiology but could be secondary to toxic -metabolic causes. No seizures or definite epileptiform discharges were seen throughout the recording.   Adrian Foster

## 2021-06-15 NOTE — Progress Notes (Signed)
Speech Language Pathology Treatment: Dysphagia  Patient Details Name: Adrian Foster MRN: [id number] DOB: 07-25-1933 Today's Date: 06/15/2021 Time: 9417-4081 SLP Time Calculation (min) (ACUTE ONLY): 17 min  Assessment / Plan / Recommendation Clinical Impression  Pt was made NPO 12/13 due to hypoxia, hypercapneic respiratory failure.  Started on BiPAP. By this afternoon he had transitioned back to [place] and during my visit was very interactive. He is still NPO.  Pt was repositioned in bed and provided with toothbrush and toothpaste to complete his own oral care.  He was able to do so and expectorate/use suctioning with minimal physical assist. No POs were offered. Recommend SLP re assessment at bedside.    Adrian Foster was provided with EMST to initiate treatment to address swallowing and cough.  Required mod verbal/visual cues to use trainer appropriately - he endorsed feeling some resistance at 35 cm H20 but responses were not reliable when resistance was dialed up and back.  Will follow-up with further assessment to ascertain best pressures at which to begin his exercises.    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  CT head showed 5 mm parafalcine subdural hematoma. CXR shows possible RLL pneumonia; given ceftriaxone/azithromycin. Intubated 12/9-10. MBS 12/12, rec regular solids, nectar thick liquids      SLP Plan  Other (Comment);Continue with current plan of care      Recommendations for follow up therapy are one component of a multi-disciplinary discharge planning process, led by the attending physician.  Recommendations may be updated based on patient status, additional functional criteria and insurance authorization.    Recommendations                   Oral Care Recommendations: Oral care QID Follow Up Recommendations:  Acute inpatient rehab (3hours/day) Assistance recommended at discharge: Frequent or constant Supervision/Assistance SLP Visit Diagnosis: Dysphagia, pharyngeal phase (R13.13) Plan: Other (Comment);Continue with current plan of care         Brigette Hopfer L. Tivis Ringer, [place] CCC/SLP Acute Rehabilitation Services Office number [phone] Pager [phone]   [name]  06/15/2021, 3:19 PM

## 2021-06-15 NOTE — Plan of Care (Signed)

## 2021-06-15 NOTE — Progress Notes (Signed)
Physical Therapy Treatment Patient Details Name: Adrian Foster MRN: [id number] DOB: Jul 08, 1933 Today's Date: 06/15/2021   History of Present Illness Patient is a 87 yo M presents to [facility] on 12/8 after mechanical fall.  CT head showed 5 mm parafalcine subdural hematoma. Found to be hypoxic with transient episodes of responsiveness once tx to ICU. CXR- RLL PNA, Chest CT- PEs. Intubated 12/9-12/10.  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    PT Comments    Pt received in supine, alert and pleasantly cooperative. Defer EOB transfer due to lack of +2 assist and pt recently had increased respiratory stress so instead emphasis on pt self-assist with bed mobility/rolling (maxA today with transfer pad assist) and supine UE/LE exercises for strengthening/improved ROM. Discussed pressure relief strategies but will need to reinforce with spouse in future sessions as she was on phone call during session and pt with noted short term memory deficit (he reports he does not remember previous day's session and was lethargic at that time). Plan to assess seated balance and progress sit<>stand transfers next session if medically stable. Pt continues to benefit from PT services to progress toward functional mobility goals. Continue to recommend AIR, pt making slow but improved progress toward goals this date.  Recommendations for follow up therapy are one component of a multi-disciplinary discharge planning process, led by the attending physician.  Recommendations may be updated based on patient status, additional functional criteria and insurance authorization.  Follow Up Recommendations  Acute inpatient rehab (3hours/day)     Assistance Recommended at Discharge Frequent or constant Supervision/Assistance  Equipment Recommendations  Other (comment) (TBA pending progress)    Recommendations for Other Services       Precautions /  Restrictions Precautions Precautions: Fall Precaution Comments: keep SBP <160, large hematoma on sacrum Restrictions Weight Bearing Restrictions: No     Mobility  Bed Mobility Overal bed mobility: Needs Assistance Bed Mobility: Rolling Rolling: Max assist         General bed mobility comments: rolling partially to each side for repositioning of cushions for pressure relief. Defer EOB due to lack of +2 assist and per RN he was needing increased O2 so defer EOB for pt/staff safety. Pt unable to pull himself into long sit in bed with railings and +1 assist.    Transfers                   General transfer comment: defer, pt fatigued after supine exercises and bed mobility    Ambulation/Gait                   Stairs             Wheelchair Mobility    Modified Rankin (Stroke Patients Only) Modified Rankin (Stroke Patients Only) Pre-Morbid Rankin Score: Moderate disability Modified Rankin: Severe disability     Balance                                            Cognition Arousal/Alertness: Awake/alert Behavior During Therapy: WFL for tasks assessed/performed Overall Cognitive Status: Impaired/Different from baseline Area of Impairment: Memory;Following commands;Safety/judgement;Problem solving;Attention;Awareness                   Current Attention Level: Focused Memory: Decreased short-term memory Following Commands: Follows one step commands with  increased time Safety/Judgement: Decreased awareness of deficits;Decreased awareness of safety Awareness: Intellectual Problem Solving: Slow processing;Difficulty sequencing;Requires verbal cues;Requires tactile cues General Comments: pt more alert today, able to follow >75% of cues during supine exercises and for repositioning in bed, but has difficulty with a few unfamiliar instructions despite multimodal cues and repetition (quad sets).        Exercises General Exercises  - Upper Extremity Shoulder Flexion: AROM;Both;10 reps;Supine (cues for deep breaths) Elbow Flexion: AROM;Both;10 reps;Supine General Exercises - Lower Extremity Ankle Circles/Pumps: PROM;AAROM;Both;10 reps;Supine Heel Slides: AROM;Both;15 reps;Supine Hip Flexion/Marching: AROM;AAROM;Both;10 reps;Seated (bed in chair posture) Other Exercises Other Exercises: supine BLE AROM: hip ab/adduction, SAQ, quad sets, glute sets x10 reps ea pt given HEP to reinforce these and items above    General Comments General comments (skin integrity, edema, etc.): VSS on 4L O2 [place] with supine exercises/repositioning. Reviewed pressure relief strategies/frequency but pt will need reinforcement due to decreased STM      Pertinent Vitals/Pain Pain Assessment: Faces Faces Pain Scale: Hurts little more Pain Location: coughing, sacrum from hematoma Pain Descriptors / Indicators: Grimacing Pain Intervention(s): Limited activity within patient's tolerance;Monitored during session;Repositioned    Home Living                          Prior Function            PT Goals (current goals can now be found in the care plan section) Acute Rehab PT Goals Patient Stated Goal: per wife, get back to independence PT Goal Formulation: With patient Time For Goal Achievement: 06/26/21 Progress towards PT goals: Progressing toward goals    Frequency    Min 4X/week      PT Plan Current plan remains appropriate    Co-evaluation              AM-PAC PT "6 Clicks" Mobility   Outcome Measure  Help needed turning from your back to your side while in a flat bed without using bedrails?: A Lot Help needed moving from lying on your back to sitting on the side of a flat bed without using bedrails?: Total Help needed moving to and from a bed to a chair (including a wheelchair)?: Total Help needed standing up from a chair using your arms (e.g., wheelchair or bedside chair)?: Total Help needed to walk in  hospital room?: Total Help needed climbing 3-5 steps with a railing? : Total 6 Click Score: 7    End of Session Equipment Utilized During Treatment: Oxygen Activity Tolerance: Patient tolerated treatment well Patient left: in bed;with call bell/phone within reach;with bed alarm set;Other (comment);with family/visitor present (pt spouse re-entering room) Nurse Communication: Other (comment);Need for lift equipment;Mobility status (needs mechanical lift for OOB) PT Visit Diagnosis: Hemiplegia and hemiparesis;Unsteadiness on feet (R26.81);Difficulty in walking, not elsewhere classified (R26.2) Hemiplegia - Right/Left: Right Hemiplegia - dominant/non-dominant: Dominant Hemiplegia - caused by: Cerebral infarction     Time: 5790-3833 PT Time Calculation (min) (ACUTE ONLY): 24 min  Charges:  $Therapeutic Exercise: 8-22 mins $Therapeutic Activity: 8-22 mins                     Latice Waitman P., PTA Acute Rehabilitation Services Pager: [phone] Office: [place] 06/15/2021, 3:10 PM

## 2021-06-15 NOTE — Progress Notes (Signed)
NAME:  Adrian Foster, MRN:  [id number], DOB:  10-29-1933, LOS: 6 ADMISSION DATE:  07/01/2021, CONSULTATION DATE:  06/13/2021 REFERRING MD:  Dr. [place] Pink, CHIEF COMPLAINT:  Pleural Effusion   History of Present Illness:  87 year old male presents to [facility] on 12/8 after mechanical fall the night prior on coumadin oncologist recommended he come to ED.   On arrival to [facility] ED on 12/8, patient was found to be hypoxic in 70s on room air. Sats improved with supplemental O2. Patient has been taking Coumadin for subsegmental PE outpatient. INR was 8.8. CT chest shows PE within segmental and subsegmental branches on RLL. CT head showed 5 mm parafalcine subdural hematoma. Neurosurgery consulted and recommend no surgery and recommend not severe enough to reverse anticoagulation. Vitamin K given to improve INR to allow [place] to start patient on heparin drip. CXR shows possible RLL pneumonia; given ceftriaxone/azithromycin. 12/9 intubated for encephalopathy and hypoxemic/hypercapnic respiratory failure. Extubated 12/10.  Patient transferred out of ICU 12/11. 12/12 pulmonary consulted for evaluation of worsening right hemithorax with near complete opacification.   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 Patient started on BiPAP late yesterday afternoon for hypercapnia noted on ABG.  Patient is more alert and able to follow commands this morning.  Objective   Blood pressure 113/64, pulse 95, temperature 98.5 F (36.9 C), temperature source Oral,  resp. rate 17, height 6\' 3"  (1.905 m), weight 121.8 kg, SpO2 100 %.    FiO2 (%):  [30 %-45 %] 45 %   Intake/Output Summary (Last 24 hours) at 06/15/2021 1520 Last data filed at 06/15/2021 1324 Gross per 24 hour  Intake 162.06 ml  Output 1600 ml  Net -1437.94 ml   Filed Weights   06/25/2021 1241 06/14/21 0413  Weight: 108.9 kg 121.8 kg    Examination: General: ill appearing elderly male, lying in bed HENT: moist mucous membranes, sclera anicteric, downward gaze Lungs: diminished right base. No wheezing or rhonchi Cardiovascular: irregular, no murmur Abdomen: Soft, non-tender, active bowel sounds  Extremities: no edema Neuro: somnolent, intermittently follows commands GU: external foley cath in place   Resolved Hospital Problem list     Assessment & Plan:   Acute Hypoxic/Hypercarbic Respiratory Failure in setting pseudomonas pneumonia and aspiration pneumonia, +Segmental/Subsegmental RLL PE  Plan - Continue zosyn for pneumonia coverage - Continue nebulizer treatments, flutter valve and chest physiotherapy for mucous clearance. - Aspiration precautions - heparin gtt for PE - Bipap PRN during the day and to be used at night.  Subdural Hematoma  Concern for Seizure > transient episode of decreased responsiveness and right upper extremity jerking  Altered Mental Status  Plan -Neurology Following - Repeat Head CT with no changes on 12/13 - Hypercapnia likely contributing to AMS along with neurologic insults and seizure medications.   PCCM will continue to follow  Best Practice (right click and "Reselect all SmartList Selections" daily)   Remaining management per primary team.   Labs   CBC: Recent Labs  Lab 06/14/2021 1303 06/10/21 0713 06/11/21 1034 06/12/21 0312 06/13/21 0309 06/14/21 0201 06/15/21 0401  WBC  9.9   < > 7.0 6.7 5.6 5.0 5.4  NEUTROABS 8.9*  --   --  6.1  --   --   --   HGB 12.7*   < > 10.7* 10.9* 10.7* 9.9* 9.4*  HCT 41.1   < > 34.8* 36.8*  35.7* 33.9* 31.6*  MCV 95.4   < > 96.1 97.9 99.4 101.2* 100.0  PLT 333   < > 230 235 210 200 198   < > = values in this interval not displayed.    Basic Metabolic Panel: Recent Labs  Lab 06/19/2021 1303 06/10/21 0713 06/10/21 0905 06/11/21 0756 06/12/21 0312 06/13/21 0309 06/15/21 0401  NA 133* 135 134*  --  137 139 140  K 4.7 4.4 4.1  --  3.2* 3.9 3.6  CL 89* 91*  --   --  94* 95* 92*  CO2 35* 37*  --   --  35* 38* 41*  GLUCOSE 161* 120*  --   --  100* 121* 96  BUN 26* 20  --   --  14 10 10   CREATININE 0.78 0.78  --   --  0.71 0.55* 0.64  CALCIUM 9.6 9.8  --   --  8.8* 8.8* 8.4*  MG  --  1.6*  --  2.0 1.8 1.8  --   PHOS  --  5.4*  --   --  2.8 2.5  --    GFR: Estimated Creatinine Clearance: 94.9 mL/min (by C-G formula based on SCr of 0.64 mg/dL). Recent Labs  Lab 06/12/21 0312 06/13/21 0309 06/14/21 0201 06/15/21 0401  WBC 6.7 5.6 5.0 5.4    Liver Function Tests: Recent Labs  Lab 06/26/2021 1303  AST 32  ALT 24  ALKPHOS 125  BILITOT 0.4  PROT 6.8  ALBUMIN 2.1*   No results for input(s): LIPASE, AMYLASE in the last 168 hours. No results for input(s): AMMONIA in the last 168 hours.  ABG    Component Value Date/Time   PHART 7.458 (H) 06/14/2021 2130   PCO2ART 57.5 (H) 06/14/2021 2130   PO2ART 56.3 (L) 06/14/2021 2130   HCO3 40.2 (H) 06/14/2021 2130   TCO2 43 (H) 06/10/2021 0905   O2SAT 92.6 06/14/2021 2130     Coagulation Profile: Recent Labs  Lab 06/11/2021 1303 06/10/21 0713 06/13/21 0309 06/14/21 0201 06/15/21 0401  INR 8.8* 1.5* 2.5* 2.5* 3.8*    Cardiac Enzymes: No results for input(s): CKTOTAL, CKMB, CKMBINDEX, TROPONINI in the last 168 hours.  HbA1C: No results found for: HGBA1C  CBG: Recent Labs  Lab 06/14/21 0401 06/14/21 1958 06/14/21 2304 06/15/21 0351 06/15/21 [place], MD [facility] Office: [phone]   See Amion for personal pager PCCM on call  pager [phone] until 7pm. Please call Elink 7p-7a. [phone]

## 2021-06-15 NOTE — Progress Notes (Signed)
CPT held at this time due to patient on BIPAP and continuous EEG

## 2021-06-15 NOTE — Progress Notes (Signed)
[place] for Heparin and Warfarin Indication: pulmonary embolus  Allergies  Allergen Reactions   Azithromycin Nausea And Vomiting   Levofloxacin Diarrhea    Patient Measurements: Height: 6\' 3"  (190.5 cm) Weight: 121.8 kg (268 lb 8.3 oz) IBW/kg (Calculated) : 84.5  Heparin Dosing Weight: 106.6 kg  Vital Signs: Temp: 98.5 F (36.9 C) (12/14 0305) Temp Source: Oral (12/14 0305) BP: 113/64 (12/14 0818) Pulse Rate: 95 (12/14 0818)  Labs: Recent Labs    06/13/21 0309 06/14/21 0201 06/14/21 1245 06/15/21 0401  HGB 10.7* 9.9*  --  9.4*  HCT 35.7* 33.9*  --  31.6*  PLT 210 200  --  198  LABPROT 26.6* 27.0*  --  37.4*  INR 2.5* 2.5*  --  3.8*  HEPARINUNFRC 0.34 0.23* 0.44  --   CREATININE 0.55*  --   --  0.64    Estimated Creatinine Clearance: 94.9 mL/min (by C-G formula based on SCr of 0.64 mg/dL).  Assessment: 87 years of age male with metastatic prostate cancer receiving radiation and on chronic warfarin therapy for history of DVT and PE who was admitted post fall with new small SDH. INR of 8.8 on admission 12/08, Vitamin K 5mg  IV given x1. INR trended down to 1.5. CT showed multiple small pulmonary embolisms. Pharmacy consulted to start IV Heparin 12/09. No surgical intervention of small SDH recommended at this time. CCM made decision to continue with warfarin - consulted Pharmacy to resume 12/11. PTA warfarin dose: 5mg  MWF and 2.5mg  all other days.   INR was 2.5 12/12 and 12/13. Per discussion w/ provider, ok to stop heparin infusion 12/13. Patient AMS improved but was made NPO later in the evening and did not receive warfarin dose 12/13. INR now 3.8. Will hold warfarin due to supratherapeutic INR. CBC remains stable, hgb 9.4, lts 198. No s/sx bleeding reported.  Goal of Therapy:  INR 2-3 Heparin level 0.3-0.7 units/ml Monitor platelets by anticoagulation protocol: Yes   Plan:  Hold warfarin dose today  Continue to monitor daily  INR, H&H and platelets Monitor closely for s/sx bleeding    Thank you for allowing pharmacy to be a part of this patients care.  Ardyth Harps, PharmD Clinical Pharmacist

## 2021-06-15 NOTE — Progress Notes (Signed)
PROGRESS NOTE  Adrian Foster  DOB: 24-Nov-1933  PCP: Charleston Poot, MD FWY:[id number]  DOA: 06/12/2021  LOS: 6 days  Hospital Day: 7  Chief Complaint  Patient presents with   Fall    Brief narrative: Adrian Foster is a 87 y.o. male with PMH significant for DM2, HTN, CAD/MI, DVT/PE on Coumadin, metastatic prostate cancer with mets to lungs on active radiation. Patient was brought to the ED on 12/8 after a mechanical fall while on Coumadin..  In the ED, patient was found to be hypoxic in 70s on room air and required supplemental oxygen. INR was elevated to 8.8 CT chest showed PE within segmental and subsegmental branches on RLL.  CT head showed 5 mm parafalcine subdural hematoma.  Admitted to hospital service Neurosurgery was consulted, did not recommend surgical intervention orders reversal of anticoagulation.  Patient was given vitamin K and also subsequently started on heparin drip  CXR showed possible RLL pneumonia; started on ceftriaxone/azithromycin.  12/9, patient was intubated for encephalopathy and hypoxemic/hypercapnic respiratory failure. 12/10, extubated  12/11, transferred out of ICU  12/12, pulmonary reconsulted for evaluation of worsening right hemithorax with near complete opacification.  12/13, started on BiPAP for hypercapnia.   Subjective: Patient was seen and examined this morning.  He was altered on BiPAP.  Able to open eyes on verbal command but only mumbled. Wife at bedside.  Assessment/Plan: Acute hypoxic/hypercarbic respiratory failure -Was on mechanical ventilation from 12/9-12/10 and was successfully extubated.  However on 12/13, patient was started on BiPAP again for worsening hypoxia. -Respiratory failure multifactorial: Pneumonia, PE, lung mets -Currently on 5 to 6 L of oxygen by nasal cannula.  Wean down as tolerated.  Aspiration pneumonia Pseudomonas in tracheal aspirate -Chest x-ray 12/12 with near complete opacification of the right  hemothorax -Continue IV Zosyn, Mucinex, chest PT, flutter valve, nebulizers -Speech therapy evaluation was obtained.  Currently remains n.p.o.  Segmental right lower lobe pulmonary embolism Previous history of DVT and PE -Was on warfarin prior to admission.  Currently on heparin and warfarin.  Subdural hematoma was thought to be a small and it was okay to continue with anticoagulation -Coumadin resumed.  Acute metabolic encephalopathy -In the setting of hypoxemia, subdural hematoma. -Mental status fluctuating, this morning patient was pretty altered on BiPAP.  This afternoon patient woke up and was more alert -Neurology following.    Subdural hematoma -Patient was noted on CT head on admission.  Small in size.  No intervention per neurosurgery.  He has been cleared to take anticoagulation.   -He is also on Vimpat for presumed seizure.  EEG 12/14 did not show any evidence of definite epileptiform discharge.  History of CAD/MI -PTA on aspirin, statin  Type 2 diabetes mellitus -Was on metformin prior to presentation -Currently on sliding scale insulin with Accu-Cheks. -Blood sugar level remains controlled. Recent Labs  Lab 06/14/21 0401 06/14/21 1958 06/14/21 2304 06/15/21 0351 06/15/21 0425  GLUCAP 105* 102* 105* 99 99   Essential hypertension -PTA on torsemide and metolazone.  Both of them are currently on hold.  Blood pressure remains controlled.   History of metastatic prostate cancer with mets to lungs Gets treatment at [facility] under the care of Dr. Baird Cancer.  Has been getting radiation treatments.  Can follow-up with his oncologist at discharge.    Dilated small bowel loops -Noted incidentally on CT scan.  Abdomen is benign.  Continue to monitor.   Indeterminate right renal lesion -Will need outpatient MRI for same.   Severe protein  calorie malnutrition -Dietitian consulted.  Supplements   Mobility: Encourage ambulation Living condition: Lives at home Goals of  care:   Code Status: Full Code  Nutritional status: Body mass index is 33.56 kg/m.  Nutrition Problem: Severe Malnutrition Etiology: chronic illness, cancer and cancer related treatments Signs/Symptoms: severe muscle depletion, severe fat depletion Diet:  Diet Order             Diet NPO time specified  Diet effective now                  DVT prophylaxis: Coumadin    Antimicrobials: IV Zosyn Fluid: None Consultants: Neurology, pulmonology Family Communication: Wife at bedside  Status is: Inpatient  Continue in-hospital care because: Altered mental status continues, high risk of aspiration Level of care: Progressive   Dispo: The patient is from: Home              Anticipated d/c is to: CIR              Patient currently is not medically stable to d/c.   Difficult to place patient No     Infusions:   sodium chloride Stopped (06/17/2021 1831)   lacosamide (VIMPAT) IV 50 mg (06/15/21 1104)   piperacillin-tazobactam (ZOSYN)  IV 3.375 g (06/15/21 1446)    Scheduled Meds:  aspirin EC  81 mg Oral Daily   Chlorhexidine Gluconate Cloth  6 each Topical Q0600   docusate sodium  100 mg Oral BID   multivitamin with minerals  1 tablet Oral Daily   pantoprazole  40 mg Oral Daily   polyethylene glycol  17 g Oral Daily   Warfarin - Pharmacist Dosing Inpatient   Does not apply q1600    PRN meds: sodium chloride   Antimicrobials: Anti-infectives (From admission, onward)    Start     Dose/Rate Route Frequency Ordered Stop   06/14/21 2200  piperacillin-tazobactam (ZOSYN) IVPB 3.375 g        3.375 g 12.5 mL/hr over 240 Minutes Intravenous Every 8 hours 06/14/21 1212     06/14/21 1300  piperacillin-tazobactam (ZOSYN) IVPB 4.5 g        4.5 g 200 mL/hr over 30 Minutes Intravenous  Once 06/14/21 1212 06/14/21 1600   06/12/21 1200  ceFEPIme (MAXIPIME) 2 g in sodium chloride 0.9 % 100 mL IVPB  Status:  Discontinued        2 g 200 mL/hr over 30 Minutes Intravenous Every 8 hours  06/12/21 1113 06/14/21 1144   06/11/21 1200  cefTRIAXone (ROCEPHIN) 2 g in sodium chloride 0.9 % 100 mL IVPB  Status:  Discontinued        2 g 200 mL/hr over 30 Minutes Intravenous Every 24 hours 06/11/21 1025 06/12/21 1113   06/10/21 1600  cefTRIAXone (ROCEPHIN) 1 g in sodium chloride 0.9 % 100 mL IVPB  Status:  Discontinued        1 g 200 mL/hr over 30 Minutes Intravenous Every 24 hours 06/10/21 1025 06/11/21 1025   06/10/21 1600  azithromycin (ZITHROMAX) 500 mg in sodium chloride 0.9 % 250 mL IVPB  Status:  Discontinued        500 mg 250 mL/hr over 60 Minutes Intravenous Every 24 hours 06/10/21 1025 06/12/21 1113   06/18/2021 1615  cefTRIAXone (ROCEPHIN) 1 g in sodium chloride 0.9 % 100 mL IVPB        1 g 200 mL/hr over 30 Minutes Intravenous  Once 06/16/2021 1605 06/25/2021 1716   06/24/2021 1615  azithromycin (  ZITHROMAX) 500 mg in sodium chloride 0.9 % 250 mL IVPB        500 mg 250 mL/hr over 60 Minutes Intravenous  Once 06/08/2021 1605 06/08/2021 1750       Objective: Vitals:   06/15/21 0339 06/15/21 0818  BP:  113/64  Pulse:  95  Resp:  17  Temp:    SpO2: 98% 100%    Intake/Output Summary (Last 24 hours) at 06/15/2021 1736 Last data filed at 06/15/2021 1324 Gross per 24 hour  Intake 62.06 ml  Output 1600 ml  Net -1537.94 ml   Filed Weights   06/30/2021 1241 06/14/21 0413  Weight: 108.9 kg 121.8 kg   Weight change:  Body mass index is 33.56 kg/m.   Physical Exam: General exam: Elderly Caucasian male.  Not in physical distress Skin: No rashes, lesions or ulcers. HEENT: Atraumatic, normocephalic, no obvious bleeding Lungs: Clear to auscultation bilaterally CVS: Regular rate and rhythm, no murmur GI/Abd soft, nontender, nondistended, bowel sound present CNS: Propped up on BiPAP, opens eyes on verbal command, mumbling Psychiatry: Unable to examine Extremities: No pedal edema, no calf tenderness  Data Review: I have personally reviewed the laboratory data and studies  available.  F/u labs ordered Unresulted Labs (From admission, onward)     Start     Ordered   06/16/21 0500  Comprehensive metabolic panel  Tomorrow morning,   R       Question:  Specimen collection method  Answer:  Lab=Lab collect   06/15/21 1452   06/13/21 0500  Protime-INR  Daily,   R     Question:  Specimen collection method  Answer:  Lab=Lab collect   06/12/21 1232   06/12/21 0500  CBC  Daily,   R     Question:  Specimen collection method  Answer:  Lab=Lab collect   06/11/21 1144   06/10/21 0139  MRSA Next Gen by PCR, Nasal  Once,   R        06/10/21 0139            Signed, Terrilee Croak, MD [facility] 06/15/2021

## 2021-06-15 NOTE — Progress Notes (Addendum)
Subjective: No clinical seizures overnight.  Patient is more awake this morning, reading newspaper.  Wife at bedside states he looks improved.  ROS: negative except above  Examination  Vital signs in last 24 hours: Temp:  [98.4 F (36.9 C)-98.5 F (36.9 C)] 98.5 F (36.9 C) (12/14 0305) Pulse Rate:  [87-95] 95 (12/14 0818) Resp:  [15-27] 17 (12/14 0818) BP: (105-125)/(58-71) 113/64 (12/14 0818) SpO2:  [93 %-100 %] 100 % (12/14 0818) FiO2 (%):  [30 %-45 %] 45 % (12/14 0740)  General: Sitting in bed, not in apparent distress CVS: pulse-normal rate and rhythm RS: breathing comfortably, CTAB Extremities: warm, no edema  Neuro: MS: Alert, oriented to self only 80, follows commands CN: pupils equal and reactive,  EOMI, face symmetric, tongue midline, normal sensation over face, Motor: 4/5 strength in BL UE, 3/5 in BL LE  Basic Metabolic Panel: Recent Labs  Lab 06/18/2021 1303 06/10/21 0713 06/10/21 0905 06/11/21 0756 06/12/21 0312 06/13/21 0309 06/15/21 0401  NA 133* 135 134*  --  137 139 140  K 4.7 4.4 4.1  --  3.2* 3.9 3.6  CL 89* 91*  --   --  94* 95* 92*  CO2 35* 37*  --   --  35* 38* 41*  GLUCOSE 161* 120*  --   --  100* 121* 96  BUN 26* 20  --   --  14 10 10   CREATININE 0.78 0.78  --   --  0.71 0.55* 0.64  CALCIUM 9.6 9.8  --   --  8.8* 8.8* 8.4*  MG  --  1.6*  --  2.0 1.8 1.8  --   PHOS  --  5.4*  --   --  2.8 2.5  --     CBC: Recent Labs  Lab 06/05/2021 1303 06/10/21 0713 06/11/21 1034 06/12/21 0312 06/13/21 0309 06/14/21 0201 06/15/21 0401  WBC 9.9   < > 7.0 6.7 5.6 5.0 5.4  NEUTROABS 8.9*  --   --  6.1  --   --   --   HGB 12.7*   < > 10.7* 10.9* 10.7* 9.9* 9.4*  HCT 41.1   < > 34.8* 36.8* 35.7* 33.9* 31.6*  MCV 95.4   < > 96.1 97.9 99.4 101.2* 100.0  PLT 333   < > 230 235 210 200 198   < > = values in this interval not displayed.     Coagulation Studies: Recent Labs    06/13/21 0309 06/14/21 0201 06/15/21 0401  LABPROT 26.6* 27.0* 37.4*  INR  2.5* 2.5* 3.8*    Imaging CT head without contrast 06/14/2021: No change since the prior exam. Atrophy and chronic small-vessel ischemic changes. No evidence of acute infarction.   Stable or slightly diminishing small subdural hematoma along the right side of the posterior falx and right posterior and anterior convexity, maximal thickness 3-4 mm.   ASSESSMENT AND PLAN: 87 year old male with transient episode of decreased responsiveness in the setting of a parafalcine subdural hematoma.     Transient alteration of awareness Acute encephalopathy ( resolved) - ddx include seizure vs due to hypooxygenation. Patient is at higher risk of seizures due to SDH.  -Encephalopathy likely due to medications, cefepime neurotoxicity   Recommendation -Discontinue LTM EEG as no further seizures overnight -Continue Vimpat 50 mg p.o. twice daily to minimize sedation - continue seizure precautions - management of rest of comorbidities per primary team -Patient most likely had provoked seizure within 2 weeks of SDH. Therefore, patient's risk  of long term epilepsy is lower.  - Recommend neurology f/u in 2-3 months. Can consider weaning off antiseizure medications at that time     I have spent a total of 25 minutes with the patient reviewing hospital notes,  test results, labs and examining the patient as well as establishing an assessment and plan that was discussed personally with the patient's wife at bedside.  > 50% of time was spent in direct patient care.    Zeb Comfort Epilepsy [facility] For questions after 5pm please refer to AMION to reach the Neurologist on call

## 2021-06-15 NOTE — Progress Notes (Signed)
LTM EEG discontinued - no skin breakdown at unhook.   

## 2021-06-16 LAB — CBC
HCT: 30.9 % — ABNORMAL LOW (ref 39.0–52.0)
Hemoglobin: 9 g/dL — ABNORMAL LOW (ref 13.0–17.0)
MCH: 29.1 pg (ref 26.0–34.0)
MCHC: 29.1 g/dL — ABNORMAL LOW (ref 30.0–36.0)
MCV: 100 fL (ref 80.0–100.0)
Platelets: 205 10*3/uL (ref 150–400)
RBC: 3.09 MIL/uL — ABNORMAL LOW (ref 4.22–5.81)
RDW: 16.3 % — ABNORMAL HIGH (ref 11.5–15.5)
WBC: 4.7 10*3/uL (ref 4.0–10.5)
nRBC: 0 % (ref 0.0–0.2)

## 2021-06-16 LAB — GLUCOSE, CAPILLARY
Glucose-Capillary: 77 mg/dL (ref 70–99)
Glucose-Capillary: 76 mg/dL (ref 70–99)
Glucose-Capillary: 84 mg/dL (ref 70–99)
Glucose-Capillary: 227 mg/dL — ABNORMAL HIGH (ref 70–99)
Glucose-Capillary: 219 mg/dL — ABNORMAL HIGH (ref 70–99)

## 2021-06-16 LAB — COMPREHENSIVE METABOLIC PANEL
ALT: 16 U/L (ref 0–44)
AST: 26 U/L (ref 15–41)
Albumin: 1.5 g/dL — ABNORMAL LOW (ref 3.5–5.0)
Alkaline Phosphatase: 62 U/L (ref 38–126)
Anion gap: 4 — ABNORMAL LOW (ref 5–15)
BUN: 13 mg/dL (ref 8–23)
CO2: 41 mmol/L — ABNORMAL HIGH (ref 22–32)
Calcium: 8.8 mg/dL — ABNORMAL LOW (ref 8.9–10.3)
Chloride: 95 mmol/L — ABNORMAL LOW (ref 98–111)
Creatinine, Ser: 0.73 mg/dL (ref 0.61–1.24)
GFR, Estimated: >60 mL/min (ref >60)
Glucose, Bld: 84 mg/dL (ref 70–99)
Potassium: 3.7 mmol/L (ref 3.5–5.1)
Sodium: 140 mmol/L (ref 135–145)
Total Bilirubin: 0.9 mg/dL (ref 0.3–1.2)
Total Protein: 5.5 g/dL — ABNORMAL LOW (ref 6.5–8.1)

## 2021-06-16 MED ORDER — SODIUM CHLORIDE 3 % IN NEBU
4.0000 mL | INHALATION_SOLUTION | Freq: Two times a day (BID) | RESPIRATORY_TRACT | Status: AC
  Administered 2021-06-16 – 2021-06-18 (×4): 4 mL via RESPIRATORY_TRACT
  Filled 2021-06-16 (×4): qty 4

## 2021-06-16 MED ORDER — INSULIN ASPART 100 UNIT/ML IJ SOLN
0.0000 [IU] | Freq: Every day | INTRAMUSCULAR | Status: DC
  Administered 2021-06-16: 2 [IU] via SUBCUTANEOUS

## 2021-06-16 MED ORDER — DEXTROSE-NACL 5-0.9 % IV SOLN: INTRAVENOUS | Status: DC

## 2021-06-16 MED ORDER — INSULIN ASPART 100 UNIT/ML IJ SOLN
0.0000 [IU] | Freq: Three times a day (TID) | INTRAMUSCULAR | Status: DC
  Administered 2021-06-17 – 2021-06-19 (×6): 2 [IU] via SUBCUTANEOUS
  Administered 2021-06-19: 13:00:00 1 [IU] via SUBCUTANEOUS
  Administered 2021-06-20 – 2021-06-21 (×2): 2 [IU] via SUBCUTANEOUS
  Administered 2021-06-23 (×2): 1 [IU] via SUBCUTANEOUS

## 2021-06-16 MED ORDER — ALBUTEROL SULFATE (2.5 MG/3ML) 0.083% IN NEBU
2.5000 mg | INHALATION_SOLUTION | RESPIRATORY_TRACT | Status: DC | PRN
  Filled 2021-06-16: qty 3

## 2021-06-16 MED ORDER — LACOSAMIDE 50 MG PO TABS
50.0000 mg | ORAL_TABLET | Freq: Two times a day (BID) | ORAL | Status: DC
  Administered 2021-06-16 – 2021-06-23 (×14): 50 mg via ORAL
  Filled 2021-06-16 (×14): qty 1

## 2021-06-16 MED ORDER — SODIUM CHLORIDE 0.9 % IV SOLN: INTRAVENOUS | Status: DC

## 2021-06-16 NOTE — Progress Notes (Signed)
HOSPITAL MEDICINE OVERNIGHT EVENT NOTE    Notified by nursing that patient has a markedly elevated INR of 5.7.   Patient is not exhibiting any evidence of concurrent bleeding and therefore reversal agents are not indicated.  Coumadin dosing has been managed per protocol.  Pharmacy to hold Coumadin dosing today until INR is once again therapeutic.  Vernelle Emerald  MD [facility]

## 2021-06-16 NOTE — Progress Notes (Signed)
Pt wearing BIPAP for night rest tolerating well.

## 2021-06-16 NOTE — Progress Notes (Signed)
Inpatient Rehabilitation Admissions Coordinator   I met with patient and his wife at bedside. He is reading paper and very talkative today. I await further progress with therapy to assist with planning rehab venue options.  Danne Baxter, RN, MSN Rehab Admissions Coordinator [phone] 06/16/2021 11:13 AM

## 2021-06-16 NOTE — Progress Notes (Signed)
NAME:  Adrian Foster, MRN:  [id number], DOB:  04-14-1934, LOS: 7 ADMISSION DATE:  06/14/2021, CONSULTATION DATE:  06/13/2021 REFERRING MD:  Dr. Maryland Pink, CHIEF COMPLAINT:  Pleural Effusion   History of Present Illness:  87 year old male presents to [facility] on 12/8 after mechanical fall the night prior on coumadin oncologist recommended he come to ED.   On arrival to [facility] ED on 12/8, patient was found to be hypoxic in 70s on room air. Sats improved with supplemental O2. Patient has been taking Coumadin for subsegmental PE outpatient. INR was 8.8. CT chest shows PE within segmental and subsegmental branches on RLL. CT head showed 5 mm parafalcine subdural hematoma. Neurosurgery consulted and recommend no surgery and recommend not severe enough to reverse anticoagulation. Vitamin K given to improve INR to allow [place] to start patient on heparin drip. CXR shows possible RLL pneumonia; given ceftriaxone/azithromycin. 12/9 intubated for encephalopathy and hypoxemic/hypercapnic respiratory failure. Extubated 12/10.  Patient transferred out of ICU 12/11. 12/12 pulmonary consulted for evaluation of worsening right hemithorax with near complete opacification.   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 Baird Cancer at [facility] > treated with radiation in 01/2021  Significant Hospital Events: Including procedures, antibiotic start and stop dates in addition to other pertinent events   12/8: admitted to [facility] on 12/8 for hypoxia 12/9 early morning intubated for encephalopathy and hypoxemic/hypercapnic respiratory failure 12/10 extubated  12/13 started on bipap for hypercapnia  Interim History / Subjective:   He is breathing a little better this morning He has been using his incentive spirometer some Trying to cough up mucus Very active this morning  Objective   Blood  pressure 110/60, pulse 81, temperature 97.7 F (36.5 C), temperature source Oral, resp. rate 20, height 6\' 3"  (1.905 m), weight 115 kg, SpO2 100 %.    FiO2 (%):  [45 %] 45 %   Intake/Output Summary (Last 24 hours) at 06/16/2021 1352 Last data filed at 06/16/2021 0616 Gross per 24 hour  Intake --  Output 1100 ml  Net -1100 ml   Filed Weights   07/02/2021 1241 06/14/21 0413 06/16/21 0417  Weight: 108.9 kg 121.8 kg 115 kg    Examination:  General:  chronically ill appearing, resting comfortably in chair HENT: NCAT OP clear PULM: Diminished slightly on R, otherwise clear bilaterally, normal effort CV: RRR, no mgr GI: BS+, soft, nontender MSK: normal bulk and tone Neuro: awake, alert, no distress, MAEW   Resolved Hospital Problem list     Assessment & Plan:   Acute Hypoxic/Hypercarbic Respiratory Failure in setting pseudomonas pneumonia and aspiration pneumonia,  Segmental/Subsegmental RLL PE Stage IIb squamous cell lung cancer RUL treated with XRT 01/2021 Bronchiectasis and RLL aspiration at baseline  Plan Continue zosyn, would plan 14 days given bronchiectasis Add back nebulized therapies: albuterol and hypertonic saline Continue flutter valve, incentive spirometry Continue chest PT with RT bid NTS prn Aspiration precautions Warfarin for known PE Bipap qHS to continue, consider backing off on this by Monday if continues to improve  Subdural Hematoma  Concern for Seizure > transient episode of decreased responsiveness and right upper extremity jerking  Altered Mental Status  Plan Per neurology   PCCM will continue to follow  Best Practice (right click and "Reselect all SmartList Selections" daily)   Remaining management per primary team.   Labs  CBC: Recent Labs  Lab 06/12/21 0312 06/13/21 0309 06/14/21 0201 06/15/21 0401 06/16/21 0338  WBC 6.7 5.6 5.0 5.4 4.7  NEUTROABS 6.1  --   --   --   --   HGB 10.9* 10.7* 9.9* 9.4* 9.0*  HCT 36.8* 35.7* 33.9*  31.6* 30.9*  MCV 97.9 99.4 101.2* 100.0 100.0  PLT 235 210 200 198 680    Basic Metabolic Panel: Recent Labs  Lab 06/10/21 0713 06/10/21 0905 06/11/21 0756 06/12/21 0312 06/13/21 0309 06/15/21 0401 06/16/21 0338  NA 135 134*  --  137 139 140 140  K 4.4 4.1  --  3.2* 3.9 3.6 3.7  CL 91*  --   --  94* 95* 92* 95*  CO2 37*  --   --  35* 38* 41* 41*  GLUCOSE 120*  --   --  100* 121* 96 84  BUN 20  --   --  14 10 10 13   CREATININE 0.78  --   --  0.71 0.55* 0.64 0.73  CALCIUM 9.8  --   --  8.8* 8.8* 8.4* 8.8*  MG 1.6*  --  2.0 1.8 1.8  --   --   PHOS 5.4*  --   --  2.8 2.5  --   --    GFR: Estimated Creatinine Clearance: 92.3 mL/min (by C-G formula based on SCr of 0.73 mg/dL). Recent Labs  Lab 06/13/21 0309 06/14/21 0201 06/15/21 0401 06/16/21 0338  WBC 5.6 5.0 5.4 4.7    Liver Function Tests: Recent Labs  Lab 06/16/21 0338  AST 26  ALT 16  ALKPHOS 62  BILITOT 0.9  PROT 5.5*  ALBUMIN 1.5*   No results for input(s): LIPASE, AMYLASE in the last 168 hours. No results for input(s): AMMONIA in the last 168 hours.  ABG    Component Value Date/Time   PHART 7.458 (H) 06/14/2021 2130   PCO2ART 57.5 (H) 06/14/2021 2130   PO2ART 56.3 (L) 06/14/2021 2130   HCO3 40.2 (H) 06/14/2021 2130   TCO2 43 (H) 06/10/2021 0905   O2SAT 92.6 06/14/2021 2130     Coagulation Profile: Recent Labs  Lab 06/10/21 0713 06/13/21 0309 06/14/21 0201 06/15/21 0401 06/16/21 0338  INR 1.5* 2.5* 2.5* 3.8* 5.7*    Cardiac Enzymes: No results for input(s): CKTOTAL, CKMB, CKMBINDEX, TROPONINI in the last 168 hours.  HbA1C: No results found for: HGBA1C  CBG: Recent Labs  Lab 06/15/21 1949 06/15/21 2357 06/16/21 0349 06/16/21 0834 06/16/21 1244  GLUCAP 96 94 77 76 84    [place] Pager: [phone] Cell: [phone] After 7:00 pm call Elink  [phone]

## 2021-06-16 NOTE — Plan of Care (Signed)
°  Problem: Education: Goal: Knowledge of General Education information will improve Description: Including pain rating scale, medication(s)/side effects and non-pharmacologic comfort measures Outcome: Progressing   Problem: Health Behavior/Discharge Planning: Goal: Ability to manage health-related needs will improve Outcome: Progressing   Problem: Clinical Measurements: Goal: Ability to maintain clinical measurements within normal limits will improve Outcome: Progressing   Problem: Activity: Goal: Risk for activity intolerance will decrease Outcome: Progressing   Problem: Nutrition: Goal: Adequate nutrition will be maintained Outcome: Progressing   Problem: Safety: Goal: Ability to remain free from injury will improve Outcome: Progressing

## 2021-06-16 NOTE — Progress Notes (Signed)
Pt removed from BIPAP and placed on 4L [place]. CPT done. Pt tolerated well.

## 2021-06-16 NOTE — Progress Notes (Signed)
Speech Language Pathology Treatment: Dysphagia  Patient Details Name: Adrian Foster MRN: [id number] DOB: 1934-03-29 Today's Date: 06/16/2021 Time: 5449-2010 SLP Time Calculation (min) (ACUTE ONLY): 25 min  Assessment / Plan / Recommendation Clinical Impression  Pt was seen for co-treatment with ST and PT during this session.  PT was able to successfully mobilize pt with ST/NA assistance to the side of the bed and then to the chair, which placed the pt in an optimal position for swallowing.  Pt was seen with trials of thin liquid, nectar-thick liquid, puree, and regular solids.  Pt masticated regular solids in a timely manner and he tolerated regular solids, puree, and nectar-thick liquid (tsp, cup, straw) without overt s/sx of aspiration across multiple trials.  Pt exhibited an immediate cough with thin liquid trials.  Cough was noted to be strong and productive, resulting in mobilization of blood-tinged phlegm to the oral cavity for suctioning.  Recommend initiation of regular solids and nectar-thick liquids with medication administered whole in puree.  Pt and wife were educated regarding recent MBS results and diet recommendations/safe swallow strategies and they verbalized understanding.  SLP will f/u for dysphagia treatment per POC.    HPI HPI: Patient is a 87 yo M w/ pertinent pmh of CAD s/p MI, DVT/PE on Coumadin, T2DM, HTN, metastatic prostate cancer w/ mets to lungs on active radiation presents to [facility] on 12/8 after mechanical fall.  CT head showed 5 mm parafalcine subdural hematoma. CXR shows possible RLL pneumonia; given ceftriaxone/azithromycin. Intubated 12/9-10. MBS 12/12, rec regular solids, nectar thick liquids      SLP Plan  Continue with current plan of care      Recommendations for follow up therapy are one component of a multi-disciplinary discharge planning process, led by the attending physician.  Recommendations may be updated based on patient status, additional functional  criteria and insurance authorization.    Recommendations  Diet recommendations: Regular;Nectar-thick liquid Liquids provided via: Cup;Straw Medication Administration: Whole meds with puree Supervision: Patient able to self feed;Intermittent supervision to cue for compensatory strategies Compensations: Slow rate;Small sips/bites;Effortful swallow;Multiple dry swallows after each bite/sip Postural Changes and/or Swallow Maneuvers: Seated upright 90 degrees                Oral Care Recommendations: Oral care BID Follow Up Recommendations: Acute inpatient rehab (3hours/day) Assistance recommended at discharge: Frequent or constant Supervision/Assistance SLP Visit Diagnosis: Dysphagia, pharyngeal phase (R13.13) Plan: Continue with current plan of care          [name] M.S., CCC-SLP Acute Rehabilitation Services Office: [phone]  [place]  06/16/2021, 1:17 PM

## 2021-06-16 NOTE — Progress Notes (Signed)
[place] for Heparin and Warfarin Indication: pulmonary embolus  Allergies  Allergen Reactions   Azithromycin Nausea And Vomiting   Levofloxacin Diarrhea    Patient Measurements: Height: 6\' 3"  (190.5 cm) Weight: 115 kg (253 lb 8.5 oz) IBW/kg (Calculated) : 84.5  Heparin Dosing Weight: 106.6 kg  Vital Signs: Temp: 97.7 F (36.5 C) (12/15 1212) Temp Source: Oral (12/15 1212) BP: 110/60 (12/15 0306) Pulse Rate: 81 (12/15 0741)  Labs: Recent Labs    06/14/21 0201 06/14/21 1245 06/15/21 0401 06/16/21 0338  HGB 9.9*  --  9.4* 9.0*  HCT 33.9*  --  31.6* 30.9*  PLT 200  --  198 205  LABPROT 27.0*  --  37.4* 51.2*  INR 2.5*  --  3.8* 5.7*  HEPARINUNFRC 0.23* 0.44  --   --   CREATININE  --   --  0.64 0.73    Estimated Creatinine Clearance: 92.3 mL/min (by C-G formula based on SCr of 0.73 mg/dL).  Assessment: 87 years of age male with metastatic prostate cancer receiving radiation and on chronic warfarin therapy for history of DVT and PE who was admitted post fall with new small SDH. INR of 8.8 on admission 12/08, Vitamin K 5mg  IV given x1. INR trended down to 1.5. CT showed multiple small pulmonary embolisms. Pharmacy consulted to start IV Heparin 12/09. No surgical intervention of small SDH recommended at this time. CCM made decision to continue with warfarin - consulted Pharmacy to resume 12/11. PTA warfarin dose: 5mg  MWF and 2.5mg  all other days.   INR was 2.5 12/12 and 12/13. Per discussion w/ provider, ok to stop heparin infusion 12/13. Patient AMS improved but was made NPO later in the evening and did not receive warfarin dose 12/13. INR 3.8 12/14, warfarin held.   INR continues to rise despite no warfarin since 12/11. Current INR 5.7. No s/sx bleeding reported. Discussed with MD. Will continue to hold warfarin due to supratherapeutic INR. Will hold off on giving reversal (i.e. vitamin k) due to asymptomatic. CBC remains stable, hgb  9.0, plts 205.   Goal of Therapy:  INR 2-3 Heparin level 0.3-0.7 units/ml Monitor platelets by anticoagulation protocol: Yes   Plan:  Hold warfarin dose today  Continue to monitor daily INR, H&H and platelets Monitor closely for s/sx bleeding    Thank you for allowing pharmacy to be a part of this patients care.  Ardyth Harps, PharmD Clinical Pharmacist

## 2021-06-16 NOTE — Progress Notes (Signed)
Occupational Therapy Treatment Patient Details Name: Adrian Foster MRN: [id number] DOB: October 25, 1933 Today's Date: 06/16/2021   History of present illness Patient is a 87 yo M presents to [facility] on 12/8 after mechanical fall.  CT head showed 5 mm parafalcine subdural hematoma. Found to be hypoxic with transient episodes of responsiveness once tx to ICU. CXR- RLL PNA, Chest CT- PEs. Intubated 12/9-12/10.  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   OT comments  Patient seated up in recliner with wife present. Patient performed sit to stand from recliner to RW with max assist and mod assist for balance. Patient stood between 20-30 seconds and HR increased to 117 and O2 dropped to 85. Patient returned to sitting in recliner with education on breathing techniques. Patient instructed on BUE strengthening exercises with theraband until lunch arrived and patient asked to stop. Acute OT to continue to follow.    Recommendations for follow up therapy are one component of a multi-disciplinary discharge planning process, led by the attending physician.  Recommendations may be updated based on patient status, additional functional criteria and insurance authorization.    Follow Up Recommendations  Acute inpatient rehab (3hours/day)    Assistance Recommended at Discharge Frequent or constant Supervision/Assistance  Equipment Recommendations  BSC/3in1    Recommendations for Other Services      Precautions / Restrictions Precautions Precautions: Fall (Simultaneous filing. User may not have seen previous data.) Precaution Comments: keep SBP <160, large hematoma on sacrum (Simultaneous filing. User may not have seen previous data.)       Mobility Bed Mobility Overal bed mobility: Needs Assistance Bed Mobility: Rolling Rolling: Max assist         General bed mobility comments: seated up in recliner (Simultaneous filing. User may  not have seen previous data.)    Transfers Overall transfer level: Needs assistance Equipment used: Rolling walker (2 wheels) Transfers: Sit to/from Stand Sit to Stand: Max assist           General transfer comment: performed one stand from recliner wtih max assist to stand and tolerated less than 30 seconds (Simultaneous filing. User may not have seen previous data.)     Balance Overall balance assessment: Needs assistance Sitting-balance support: Feet supported;Bilateral upper extremity supported Sitting balance-Leahy Scale: Poor Sitting balance - Comments: seated up in recliiner   Standing balance support: Bilateral upper extremity supported;Reliant on assistive device for balance Standing balance-Leahy Scale: Poor Standing balance comment: mod assist for standing balance, reliant on RW for support                           ADL either performed or assessed with clinical judgement   ADL                                              Extremity/Trunk Assessment              Vision       Perception     Praxis      Cognition Arousal/Alertness: Awake/alert (Simultaneous filing. User may not have seen previous data.) Behavior During Therapy: [facility] for tasks assessed/performed (Simultaneous filing. User may not have seen previous data.) Overall Cognitive Status: Impaired/Different from baseline (Simultaneous filing. User may not have seen previous data.) Area of  Impairment: Memory;Following commands;Safety/judgement;Problem solving;Attention;Awareness (Simultaneous filing. User may not have seen previous data.)                   Current Attention Level: Focused (Simultaneous filing. User may not have seen previous data.) Memory: Decreased short-term memory (Simultaneous filing. User may not have seen previous data.) Following Commands: Follows one step commands with increased time (Simultaneous filing. User may not have seen previous  data.) Safety/Judgement: Decreased awareness of deficits;Decreased awareness of safety (Simultaneous filing. User may not have seen previous data.) Awareness: Intellectual (Simultaneous filing. User may not have seen previous data.) Problem Solving: Slow processing;Difficulty sequencing;Requires verbal cues;Requires tactile cues (Simultaneous filing. User may not have seen previous data.) General Comments: required frequent cues for breathing technique (Simultaneous filing. User may not have seen previous data.)          Exercises Exercises: General Upper Extremity (Simultaneous filing. User may not have seen previous data.) General Exercises - Upper Extremity Shoulder Flexion: AROM;Both;10 reps;Supine (cues for deep breaths) Shoulder ABduction: Strengthening;10 reps;Theraband;Both Theraband Level (Shoulder Abduction): Level 1 (Yellow) Elbow Flexion: Strengthening;Both;10 reps;Seated;Theraband (Simultaneous filing. User may not have seen previous data.) Theraband Level (Elbow Flexion): Level 1 (Yellow) General Exercises - Lower Extremity Ankle Circles/Pumps: PROM;AAROM;Both;10 reps;Supine Heel Slides: AROM;Both;15 reps;Supine Hip Flexion/Marching: AROM;AAROM;Both;10 reps;Seated (bed in chair posture) Other Exercises Other Exercises: supine BLE AROM: hip ab/adduction, SAQ, quad sets, glute sets x10 reps ea pt given HEP to reinforce these and items above   Shoulder Instructions       General Comments      Pertinent Vitals/ Pain       Pain Assessment: No/denies pain Faces Pain Scale: Hurts little more Pain Location: coughing, sacrum from hematoma Pain Descriptors / Indicators: Grimacing  Home Living                                          Prior Functioning/Environment              Frequency  Min 2X/week        Progress Toward Goals  OT Goals(current goals can now be found in the care plan section)  Progress towards OT goals: Progressing toward  goals  Acute Rehab OT Goals Patient Stated Goal: get better OT Goal Formulation: With patient/family Time For Goal Achievement: 06/26/21 Potential to Achieve Goals: Good ADL Goals Pt Will Transfer to Toilet: with min assist;stand pivot transfer;bedside commode Pt/caregiver will Perform Home Exercise Program: Increased strength;Both right and left upper extremity;With minimal assist;With written HEP provided;With theraband Additional ADL Goal #1: Pt will increase to x6 mins of OOB ADL tasks in sitting with set-upA.  Plan Discharge plan remains appropriate    Co-evaluation          AM-PAC OT "6 Clicks" Daily Activity     Outcome Measure   Help from another person eating meals?: A Lot Help from another person taking care of personal grooming?: A Lot Help from another person toileting, which includes using toliet, bedpan, or urinal?: A Lot Help from another person bathing (including washing, rinsing, drying)?: A Lot Help from another person to put on and taking off regular upper body clothing?: A Lot Help from another person to put on and taking off regular lower body clothing?: A Lot 6 Click Score: 12    End of Session Equipment Utilized During Treatment: Gait belt;Rolling walker (2 wheels);Oxygen  OT  Visit Diagnosis: Unsteadiness on feet (R26.81);Muscle weakness (generalized) (M62.81);Pain;Other symptoms and signs involving cognitive function   Activity Tolerance Patient limited by fatigue   Patient Left in chair;with call bell/phone within reach;with chair alarm set;with family/visitor present   Nurse Communication Mobility status        Time: 1345-1400 OT Time Calculation (min): 15 min  Charges: OT General Charges $OT Visit: 1 Visit OT Treatments $Therapeutic Activity: 8-22 mins  Lodema Hong, [place]  Pager [phone] Office [place] 06/16/2021, 2:16 PM

## 2021-06-16 NOTE — Progress Notes (Signed)
Pt w/ critical INR 5.7. Dr. Cyd Silence informed.

## 2021-06-16 NOTE — Progress Notes (Signed)
PROGRESS NOTE  [name]  DOB: 1934-06-06  PCP: Charleston Poot, MD IEP:[id number]  DOA: 06/22/2021  LOS: 7 days  Hospital Day: 8  Chief Complaint  Patient presents with   Fall    Brief narrative: Franciso Dierks is a 87 y.o. male with PMH significant for DM2, HTN, CAD/MI, DVT/PE on Coumadin, metastatic prostate cancer with mets to lungs on active radiation. Patient was brought to the ED on 12/8 after a mechanical fall while on Coumadin..  In the ED, patient was found to be hypoxic in 70s on room air and required supplemental oxygen. INR was elevated to 8.8 CT chest showed PE within segmental and subsegmental branches on RLL.  CT head showed 5 mm parafalcine subdural hematoma.  Admitted to hospital service Neurosurgery was consulted, did not recommend surgical intervention orders reversal of anticoagulation.  Patient was given vitamin K and also subsequently started on heparin drip  CXR showed possible RLL pneumonia; started on ceftriaxone/azithromycin.  12/9, patient was intubated for encephalopathy and hypoxemic/hypercapnic respiratory failure. 12/10, extubated  12/11, transferred out of ICU  12/12, pulmonary reconsulted for evaluation of worsening right hemithorax with near complete opacification.  12/13, started on BiPAP for hypercapnia.   Subjective: Patient was seen and examined this morning.   Patient is on 4 L oxygen by nasal cannula with oxygen saturation 100%.  Was on BiPAP overnight.  He is alert, awake, oriented x3 and has a strong voice.  Wife at bedside.  She is concerned about his change in mentation yesterday afternoon.  She also has reluctance about tube feeding.   To be seen by speech and PT today.  Assessment/Plan: Acute hypoxic/hypercarbic respiratory failure -Likely secondary to pneumonia.  Also contributed by PE and lung mets.   -Was on mechanical ventilation from 12/9-12/10 and was successfully extubated. However on 12/13, patient was started on BiPAP  again for worsening hypoxia.  He has been intermittently on BiPAP since then. -Currently on 4 L of oxygen by nasal cannula.  Wean down as tolerated.  Nightly BiPAP encouraged.  Aspiration pneumonia Pseudomonas in tracheal aspirate -Chest x-ray 12/12 with near complete opacification of the right hemothorax -Continue IV Zosyn, Mucinex, chest PT, flutter valve, nebulizers -Patient was planned for core track feeding tube placement.  However with improvement in his respiratory status and also in light of high INR, he may not need Cortrak at this time.  Patient and family would like to avoid tube feeding if possible. -Speech therapy to see him again this morning.    Segmental right lower lobe pulmonary embolism Previous history of DVT and PE Supratherapeutic INR -Was on warfarin prior to admission.  He presented with INR elevated to 8.8 and at the same time had bilateral PE which probably could be residual PE from previous episode.   -He also had subdural hematoma because of which INR was initially reverted but later per neurosurgery recommendation of anticoagulation was restarted.   -INR supratherapeutic for last 2 days, 5.7 today.  Coumadin on hold.  Pharmacy following. Recent Labs  Lab 06/10/21 0713 06/13/21 0309 06/14/21 0201 06/15/21 0401 06/16/21 0338  INR 1.5* 2.5* 2.5* 3.8* 5.7*   Acute metabolic encephalopathy -In the setting of hypoxemia, subdural hematoma, prolonged hospitalization -Mental status fluctuating.yesterday afternoon, patient was agitated, restless and disoriented.  This morning patient remains alert, awake, oriented x3.  Haldol as needed.   Subdural hematoma -Patient was noted on CT head on admission.  Small in size.  No intervention per neurosurgery.  He has been  cleared to take anticoagulation.   -He is also on Vimpat for presumed seizure.  EEG 12/14 did not show any evidence of definite epileptiform discharge.  History of CAD/MI -PTA on aspirin, statin  Type 2  diabetes mellitus -Was on metformin prior to presentation -Currently on sliding scale insulin with Accu-Cheks. -Blood sugar level remains consistently less than 100.  While he is n.p.o., waiting for speech therapy evaluation, I decided to put him on dextrose drip today. Recent Labs  Lab 06/15/21 0425 06/15/21 1949 06/15/21 2357 06/16/21 0349 06/16/21 0834  GLUCAP 99 96 94 77 76   Essential hypertension -PTA on torsemide and metolazone.  Both of them are currently on hold.  Blood pressure remains controlled.   History of metastatic prostate cancer with mets to lungs -Gets treatment at [facility] under the care of Dr. Baird Cancer.  Has been getting radiation treatments.  Can follow-up with his oncologist at discharge.    Dilated small bowel loops -Noted incidentally on CT scan.  Abdomen is benign.  Continue to monitor.   Indeterminate right renal lesion -Will need outpatient MRI for same.   Severe protein calorie malnutrition -Dietitian consulted.  Supplements ordered  Mobility: Encourage ambulation Living condition: Lives at home Goals of care:   Code Status: Full Code  Nutritional status: Body mass index is 31.69 kg/m.  Nutrition Problem: Severe Malnutrition Etiology: chronic illness, cancer and cancer related treatments Signs/Symptoms: severe muscle depletion, severe fat depletion Diet:  Diet Order             Diet NPO time specified  Diet effective now                  DVT prophylaxis: Coumadin    Antimicrobials: IV Zosyn Fluid: None Consultants: Neurology, pulmonology Family Communication: Wife at bedside  Status is: Inpatient  Continue in-hospital care because: Needs to further monitor mental status, respite status.  Pending speech therapy evaluation today  Level of care: Progressive   Dispo: The patient is from: Home              Anticipated d/c is to: CIR              Patient currently is not medically stable to d/c.   Difficult to place patient  No     Infusions:   sodium chloride Stopped (06/21/2021 1831)   dextrose 5 % and 0.9% NaCl     lacosamide (VIMPAT) IV 50 mg (06/15/21 2137)   piperacillin-tazobactam (ZOSYN)  IV 3.375 g (06/16/21 0540)    Scheduled Meds:  aspirin EC  81 mg Oral Daily   Chlorhexidine Gluconate Cloth  6 each Topical Q0600   docusate sodium  100 mg Oral BID   multivitamin with minerals  1 tablet Oral Daily   pantoprazole  40 mg Oral Daily   polyethylene glycol  17 g Oral Daily   Warfarin - Pharmacist Dosing Inpatient   Does not apply q1600    PRN meds: sodium chloride, haloperidol lactate   Antimicrobials: Anti-infectives (From admission, onward)    Start     Dose/Rate Route Frequency Ordered Stop   06/14/21 2200  piperacillin-tazobactam (ZOSYN) IVPB 3.375 g        3.375 g 12.5 mL/hr over 240 Minutes Intravenous Every 8 hours 06/14/21 1212     06/14/21 1300  piperacillin-tazobactam (ZOSYN) IVPB 4.5 g        4.5 g 200 mL/hr over 30 Minutes Intravenous  Once 06/14/21 1212 06/14/21 1600   06/12/21  1200  ceFEPIme (MAXIPIME) 2 g in sodium chloride 0.9 % 100 mL IVPB  Status:  Discontinued        2 g 200 mL/hr over 30 Minutes Intravenous Every 8 hours 06/12/21 1113 06/14/21 1144   06/11/21 1200  cefTRIAXone (ROCEPHIN) 2 g in sodium chloride 0.9 % 100 mL IVPB  Status:  Discontinued        2 g 200 mL/hr over 30 Minutes Intravenous Every 24 hours 06/11/21 1025 06/12/21 1113   06/10/21 1600  cefTRIAXone (ROCEPHIN) 1 g in sodium chloride 0.9 % 100 mL IVPB  Status:  Discontinued        1 g 200 mL/hr over 30 Minutes Intravenous Every 24 hours 06/10/21 1025 06/11/21 1025   06/10/21 1600  azithromycin (ZITHROMAX) 500 mg in sodium chloride 0.9 % 250 mL IVPB  Status:  Discontinued        500 mg 250 mL/hr over 60 Minutes Intravenous Every 24 hours 06/10/21 1025 06/12/21 1113   06/03/2021 1615  cefTRIAXone (ROCEPHIN) 1 g in sodium chloride 0.9 % 100 mL IVPB        1 g 200 mL/hr over 30 Minutes Intravenous  Once  06/17/2021 1605 06/29/2021 1716   06/05/2021 1615  azithromycin (ZITHROMAX) 500 mg in sodium chloride 0.9 % 250 mL IVPB        500 mg 250 mL/hr over 60 Minutes Intravenous  Once 06/30/2021 1605 06/12/2021 1750       Objective: Vitals:   06/16/21 0707 06/16/21 0741  BP:    Pulse:  81  Resp: 18   Temp: 98.3 F (36.8 C)   SpO2:  100%    Intake/Output Summary (Last 24 hours) at 06/16/2021 1102 Last data filed at 06/16/2021 0616 Gross per 24 hour  Intake --  Output 1750 ml  Net -1750 ml   Filed Weights   06/26/2021 1241 06/14/21 0413 06/16/21 0417  Weight: 108.9 kg 121.8 kg 115 kg   Weight change:  Body mass index is 31.69 kg/m.   Physical Exam: General exam: Elderly Caucasian male.  Not in physical distress.  Not on BiPAP this morning Skin: No rashes, lesions or ulcers. HEENT: Atraumatic, normocephalic, no obvious bleeding Lungs: Clear to auscultation bilaterally CVS: Regular rate and rhythm, no murmur GI/Abd soft, nontender, nondistended, bowel sound present CNS: Alert, awake abound x3 Psychiatry: Mood appropriate Extremities: No pedal edema, no calf tenderness  Data Review: I have personally reviewed the laboratory data and studies available.  F/u labs ordered Unresulted Labs (From admission, onward)     Start     Ordered   06/17/21 6063  Basic metabolic panel  Daily,   R     Question:  Specimen collection method  Answer:  Lab=Lab collect   06/16/21 1102   06/13/21 0500  Protime-INR  Daily,   R     Question:  Specimen collection method  Answer:  Lab=Lab collect   06/12/21 1232   06/12/21 0500  CBC  Daily,   R     Question:  Specimen collection method  Answer:  Lab=Lab collect   06/11/21 1144   06/10/21 0139  MRSA Next Gen by PCR, Nasal  Once,   R        06/10/21 0139            Signed, Terrilee Croak, MD [facility] 06/16/2021

## 2021-06-16 NOTE — Progress Notes (Signed)
Physical Therapy Treatment Patient Details Name: Mayer Vondrak MRN: [id number] DOB: 08-29-1933 Today's Date: 06/16/2021   History of Present Illness Patient is a 87 yo M presents to [facility] on 12/8 after mechanical fall.  CT head showed 5 mm parafalcine subdural hematoma. Found to be hypoxic with transient episodes of responsiveness once tx to ICU. CXR- RLL PNA, Chest CT- PEs. Intubated 12/9-12/10.  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    PT Comments    Pt received in supine, agreeable to therapy session and spouse present and encouraging. Pt able to perform rolling and transition to EOB with maxA +2 and SLP present due to pt multidisciplinary therapy needs and need for +2 staff assist for safety due to body habitus, deconditioning and elevated INR (MD gave clearance for mobility OOB in setting of INR >5). Pt with improved functional mobility performance today as he was able to tolerate sitting EOB >8 minutes, standing for up to ~1 minute and SpO2 WFL on 5-6L HF [place] (increased to 6L per portable O2 tank doesn't have 5L setting while transferring). RN notified lift pad in room in case he is too fatigued to stand to [place] later in day when transferring back and pt/spouse encouraged to notify staff via call bell after 1-2 hours when he is ready to get back to bed as prolonged sitting may be fatiguing to him with his recent deconditioning.  Recommendations for follow up therapy are one component of a multi-disciplinary discharge planning process, led by the attending physician.  Recommendations may be updated based on patient status, additional functional criteria and insurance authorization.  Follow Up Recommendations  Acute inpatient rehab (3hours/day)     Assistance Recommended at Discharge Frequent or constant Supervision/Assistance  Equipment Recommendations  Other (comment) (TBA pending progress)    Recommendations for  Other Services       Precautions / Restrictions Precautions Precautions: Fall Precaution Comments: keep SBP <160, large hematoma on sacrum     Mobility  Bed Mobility Overal bed mobility: Needs Assistance Bed Mobility: Rolling Rolling: Max assist;+2 for physical assistance Sidelying to sit: Max assist;+2 for physical assistance;HOB elevated       General bed mobility comments: pt needs hand over hand assist for safe technique to pull himself toward L side of bed and reliant on HOB elevated/bed rail and maxA for trunk rise    Transfers Overall transfer level: Needs assistance Equipment used: Ambulation equipment used Transfers: Sit to/from Stand;Bed to chair/wheelchair/BSC Sit to Stand: Max assist;From elevated surface;+2 physical assistance           General transfer comment: performed one stand from very elevated bed to [facility], able to stand ~45 seconds prior to sitting on Stedy seat, then stood from [place] briefly prior to sitting in Arts administrator via Lift Equipment: Stedy  Ambulation/Gait               General Gait Details: pt unable to perform pre-gait tasks in [place] 2/2 fatigue and posterior lean, utlized Stedy for safety with OOB transfer     Modified Rankin (Stroke Patients Only) Modified Rankin (Stroke Patients Only) Pre-Morbid Rankin Score: Moderate disability Modified Rankin: Severe disability     Balance Overall balance assessment: Needs assistance Sitting-balance support: Feet supported;Bilateral upper extremity supported Sitting balance-Leahy Scale:  (Fair to Poor) Sitting balance - Comments: posterior and right lean seated EOB needing supervision to min guard for ~8 mins prior to standing  Standing balance support: Bilateral upper extremity supported;Reliant on assistive device for balance Standing balance-Leahy Scale: Poor Standing balance comment: mod to max assist for standing balance, reliant on BUE and external support          Cognition Arousal/Alertness: Awake/alert Behavior During Therapy: WFL for tasks assessed/performed Overall Cognitive Status: Impaired/Different from baseline Area of Impairment: Memory;Following commands;Safety/judgement;Problem solving;Attention;Awareness        Current Attention Level: Sustained Memory: Decreased short-term memory Following Commands: Follows one step commands with increased time Safety/Judgement: Decreased awareness of deficits;Decreased awareness of safety Awareness: Intellectual Problem Solving: Slow processing;Difficulty sequencing;Requires verbal cues;Requires tactile cues;Decreased initiation General Comments: He required frequent cues for breathing technique and multimodal cues for hand placement and safe body mechanics, but participatory throughout as able.        Exercises General Exercises - Upper Extremity Shoulder Flexion: AROM;Both;10 reps;Supine (cues for deep breaths) Shoulder ABduction: Strengthening;10 reps;Theraband;Both Theraband Level (Shoulder Abduction): Level 1 (Yellow) Elbow Flexion: Strengthening;Both;10 reps;Seated;Theraband (Simultaneous filing. User may not have seen previous data.) Theraband Level (Elbow Flexion): Level 1 (Yellow) General Exercises - Lower Extremity Ankle Circles/Pumps: PROM;AAROM;Both;10 reps;Supine Heel Slides: AROM;Both;15 reps;Supine Hip Flexion/Marching: AROM;AAROM;Both;10 reps;Seated (bed in chair posture) Other Exercises Other Exercises: encouraged him to continue HEP program, he reports it is difficult with prevalon boots on. PTA encouraged him to have nursing staff or his spouse remove boots while he completes exercises and he can re-do them afterward.    General Comments General comments (skin integrity, edema, etc.): SpO2 90-93% seated EOB on 5L HF [place], HR 100 bpm resting and to 112 bpm while seated; on 6L during Stedy pivot and WFL and SpO2 97%-98% once back on 5L wall O2      Pertinent Vitals/Pain Pain  Assessment: Faces Faces Pain Scale: Hurts little more Pain Location: coughing, sacrum from hematoma Pain Descriptors / Indicators: Grimacing Pain Intervention(s): Limited activity within patient's tolerance;Monitored during session;Repositioned           PT Goals (current goals can now be found in the care plan section) Acute Rehab PT Goals Patient Stated Goal: Per wife, get back to independence PT Goal Formulation: With patient Time For Goal Achievement: 06/26/21 Progress towards PT goals: Progressing toward goals    Frequency    Min 4X/week      PT Plan Current plan remains appropriate    Co-evaluation PT/OT/SLP Co-Evaluation/Treatment: Yes Reason for Co-Treatment: Complexity of the patient's impairments (multi-system involvement);Necessary to address cognition/behavior during functional activity;For patient/therapist safety;To address functional/ADL transfers PT goals addressed during session: Mobility/safety with mobility;Balance;Proper use of DME;Strengthening/ROM   SLP goals addressed during session: Swallowing    AM-PAC PT "6 Clicks" Mobility   Outcome Measure  Help needed turning from your back to your side while in a flat bed without using bedrails?: A Lot Help needed moving from lying on your back to sitting on the side of a flat bed without using bedrails?: Total Help needed moving to and from a bed to a chair (including a wheelchair)?: Total Help needed standing up from a chair using your arms (e.g., wheelchair or bedside chair)?: A Lot Help needed to walk in hospital room?: Total Help needed climbing 3-5 steps with a railing? : Total 6 Click Score: 8    End of Session Equipment Utilized During Treatment: Oxygen;Gait belt Activity Tolerance: Patient tolerated treatment well Patient left: with call bell/phone within reach;with family/visitor present;in chair;with chair alarm set Nurse Communication: Other (comment);Need for lift equipment;Mobility status  (mechanical lift pad in room if  he is too fatigued to use Stedy for back to bed.) PT Visit Diagnosis: Hemiplegia and hemiparesis;Unsteadiness on feet (R26.81);Difficulty in walking, not elsewhere classified (R26.2) Hemiplegia - Right/Left: Right Hemiplegia - dominant/non-dominant: Dominant Hemiplegia - caused by: Cerebral infarction     Time: 1225-1304 PT Time Calculation (min) (ACUTE ONLY): 39 min  Charges:  $Therapeutic Activity: 23-37 mins                     Kristy Catoe P., PTA Acute Rehabilitation Services Pager: [phone] Office: [place] 06/16/2021, 2:35 PM

## 2021-06-17 ENCOUNTER — Inpatient Hospital Stay (HOSPITAL_COMMUNITY): Payer: Medicare Other

## 2021-06-17 LAB — CBC
HCT: 32.2 % — ABNORMAL LOW (ref 39.0–52.0)
Hemoglobin: 9.2 g/dL — ABNORMAL LOW (ref 13.0–17.0)
MCH: 28.7 pg (ref 26.0–34.0)
MCHC: 28.6 g/dL — ABNORMAL LOW (ref 30.0–36.0)
MCV: 100.3 fL — ABNORMAL HIGH (ref 80.0–100.0)
Platelets: 213 10*3/uL (ref 150–400)
RBC: 3.21 MIL/uL — ABNORMAL LOW (ref 4.22–5.81)
RDW: 16.1 % — ABNORMAL HIGH (ref 11.5–15.5)
WBC: 5.5 10*3/uL (ref 4.0–10.5)
nRBC: 0 % (ref 0.0–0.2)

## 2021-06-17 LAB — GLUCOSE, CAPILLARY
Glucose-Capillary: 113 mg/dL — ABNORMAL HIGH (ref 70–99)
Glucose-Capillary: 124 mg/dL — ABNORMAL HIGH (ref 70–99)
Glucose-Capillary: 168 mg/dL — ABNORMAL HIGH (ref 70–99)
Glucose-Capillary: 171 mg/dL — ABNORMAL HIGH (ref 70–99)
Glucose-Capillary: 175 mg/dL — ABNORMAL HIGH (ref 70–99)
Glucose-Capillary: 179 mg/dL — ABNORMAL HIGH (ref 70–99)
Glucose-Capillary: 199 mg/dL — ABNORMAL HIGH (ref 70–99)

## 2021-06-17 LAB — BASIC METABOLIC PANEL
Anion gap: 6 (ref 5–15)
BUN: 19 mg/dL (ref 8–23)
CO2: 39 mmol/L — ABNORMAL HIGH (ref 22–32)
Calcium: 9 mg/dL (ref 8.9–10.3)
Chloride: 97 mmol/L — ABNORMAL LOW (ref 98–111)
Creatinine, Ser: 0.73 mg/dL (ref 0.61–1.24)
GFR, Estimated: >60 mL/min (ref >60)
Glucose, Bld: 118 mg/dL — ABNORMAL HIGH (ref 70–99)
Potassium: 4 mmol/L (ref 3.5–5.1)
Sodium: 142 mmol/L (ref 135–145)

## 2021-06-17 LAB — PROTIME-INR
INR: 3.2 — ABNORMAL HIGH (ref 0.8–1.2)
Prothrombin Time: 32.9 seconds — ABNORMAL HIGH (ref 11.4–15.2)

## 2021-06-17 LAB — HEMOGLOBIN A1C
Hgb A1c MFr Bld: 6.4 % — ABNORMAL HIGH (ref 4.8–5.6)
Mean Plasma Glucose: 136.98 mg/dL

## 2021-06-17 IMAGING — DX DG CHEST 1V
1 series · 1 of 1 positions shown · non-contrast
Comparison: [DATE].

CLINICAL DATA: Shortness of breath.

EXAM:
CHEST  1 VIEW

[chest]
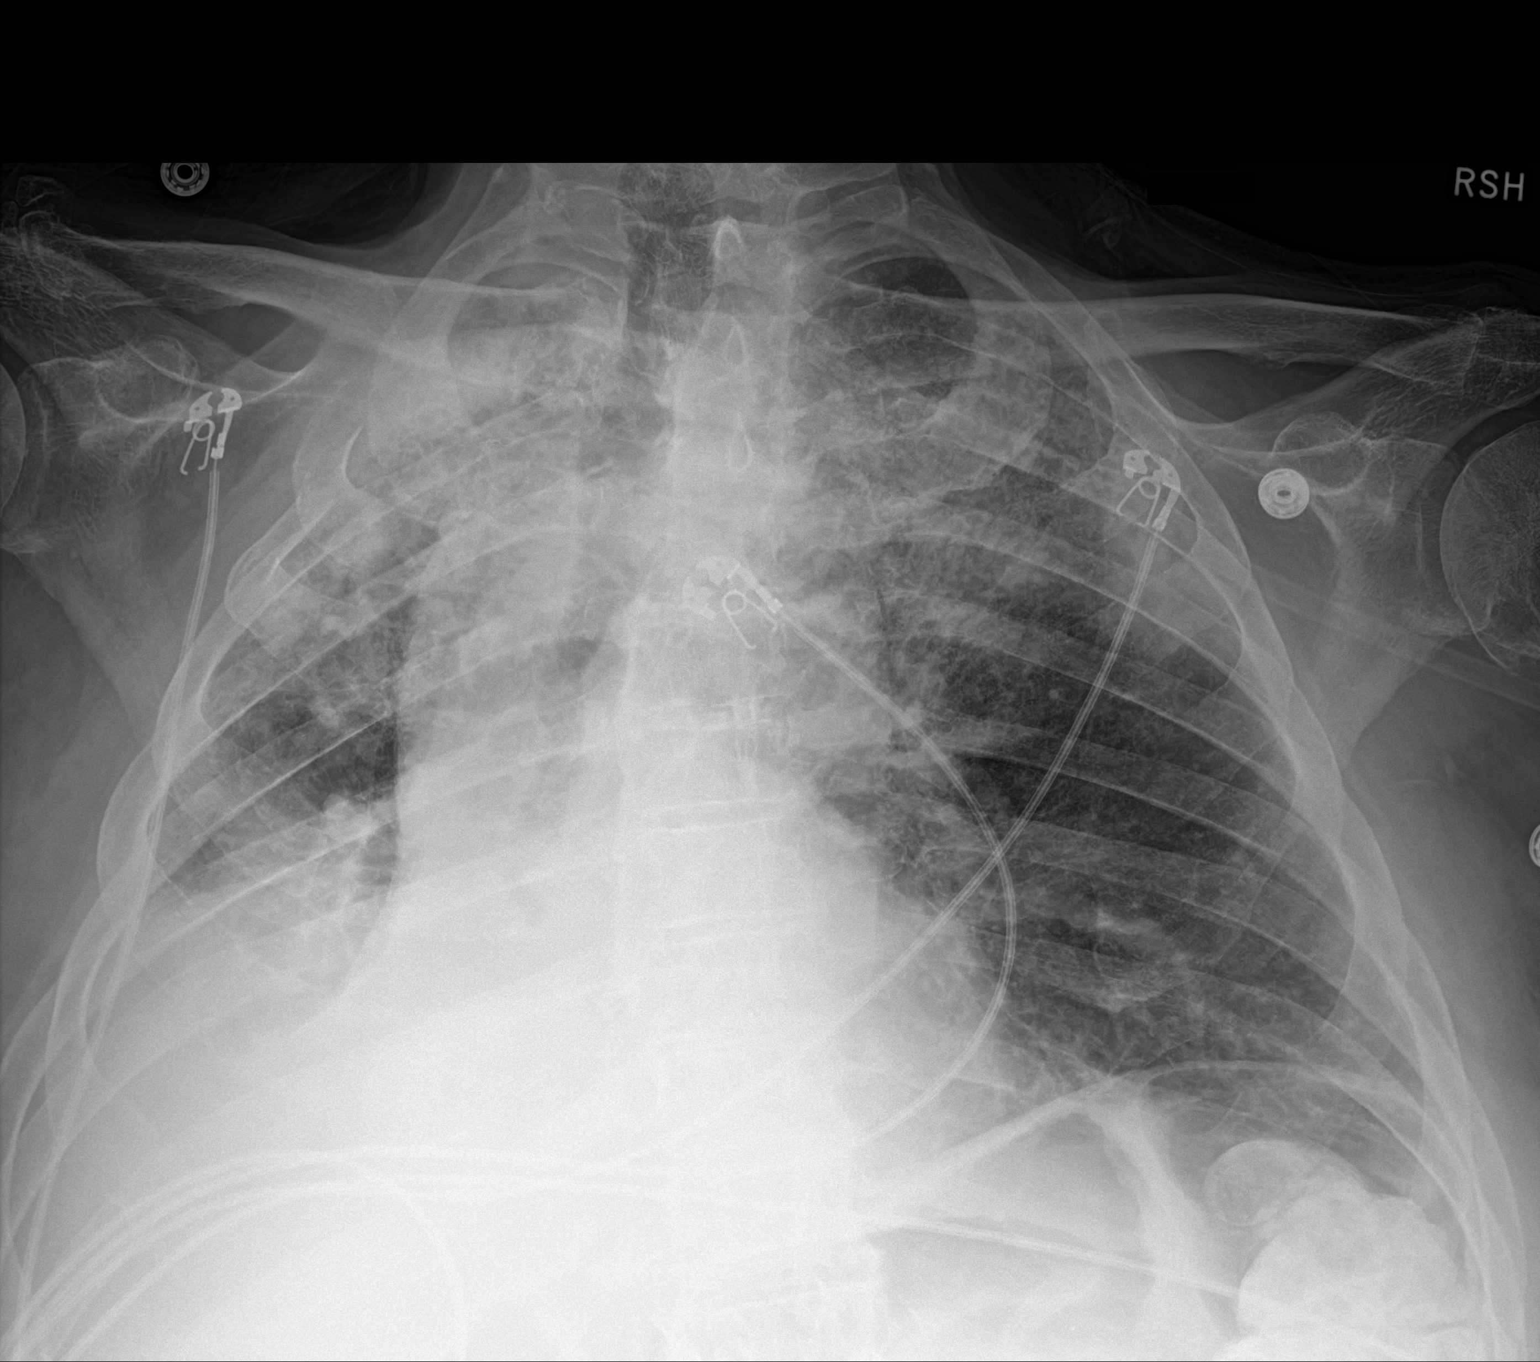

[1 of 1 positions shown; findings below may reference images not displayed]

FINDINGS: Stable cardiomediastinal silhouette. Stable right basilar
atelectasis or infiltrate is noted. Stable mild left upper and lower
lobe opacities are noted. Stable right upper lobe opacity is noted.
Bony thorax is unremarkable.
IMPRESSION: Stable bilateral lung opacities.

## 2021-06-17 MED ORDER — BISACODYL 10 MG RE SUPP
10.0000 mg | Freq: Every day | RECTAL | Status: DC
  Administered 2021-06-17 – 2021-06-22 (×5): 10 mg via RECTAL
  Filled 2021-06-17 (×6): qty 1

## 2021-06-17 NOTE — Plan of Care (Signed)
°  Problem: Education: Goal: Knowledge of General Education information will improve Description: Including pain rating scale, medication(s)/side effects and non-pharmacologic comfort measures Outcome: Progressing   Problem: Health Behavior/Discharge Planning: Goal: Ability to manage health-related needs will improve Outcome: Progressing   Problem: Clinical Measurements: Goal: Ability to maintain clinical measurements within normal limits will improve Outcome: Progressing   Problem: Activity: Goal: Risk for activity intolerance will decrease Outcome: Progressing   Problem: Nutrition: Goal: Adequate nutrition will be maintained Outcome: Progressing   Problem: Elimination: Goal: Will not experience complications related to bowel motility Outcome: Progressing   Problem: Skin Integrity: Goal: Risk for impaired skin integrity will decrease Outcome: Progressing

## 2021-06-17 NOTE — Care Management Important Message (Signed)
Important Message  Patient Details  Name: Adrian Foster MRN: [id number] Date of Birth: 15-Oct-1933   Medicare Important Message Given:  Yes     Shelda Altes 06/17/2021, 12:09 PM

## 2021-06-17 NOTE — Progress Notes (Signed)
Pt removed from BIPAP and placed on 4L [place]. CPT done at this time. Pt tolerated well.

## 2021-06-17 NOTE — Progress Notes (Signed)
NAME:  Adrian Foster, MRN:  [id number], DOB:  Sep 29, 1933, LOS: 8 ADMISSION DATE:  06/16/2021, CONSULTATION DATE:  06/13/2021 REFERRING MD:  Dr. Maryland Pink, CHIEF COMPLAINT:  Pleural Effusion   History of Present Illness:  87 year old male presents to [facility] on 12/8 after mechanical fall the night prior on coumadin oncologist recommended he come to ED.   On arrival to [facility] ED on 12/8, patient was found to be hypoxic in 70s on room air. Sats improved with supplemental O2. Patient has been taking Coumadin for subsegmental PE outpatient. INR was 8.8. CT chest shows PE within segmental and subsegmental branches on RLL. CT head showed 5 mm parafalcine subdural hematoma. Neurosurgery consulted and recommend no surgery and recommend not severe enough to reverse anticoagulation. Vitamin K given to improve INR to allow [place] to start patient on heparin drip. CXR shows possible RLL pneumonia; given ceftriaxone/azithromycin. 12/9 intubated for encephalopathy and hypoxemic/hypercapnic respiratory failure. Extubated 12/10.  Patient transferred out of ICU 12/11. 12/12 pulmonary consulted for evaluation of worsening right hemithorax with near complete opacification.   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 Baird Cancer at [facility] > treated with radiation in 01/2021  Significant Hospital Events: Including procedures, antibiotic start and stop dates in addition to other pertinent events   12/8: admitted to [facility] on 12/8 for hypoxia 12/9 early morning intubated for encephalopathy and hypoxemic/hypercapnic respiratory failure 12/10 extubated  12/13 started on bipap for hypercapnia  Interim History / Subjective:   No events. Re-educated on IS use, getting to around 500-750. No BM in > 1 week. MBSS noted.  Objective   Blood pressure (!) 93/54, pulse 83, temperature 98.1 F (36.7  C), temperature source Oral, resp. rate 20, height 6\' 3"  (1.905 m), weight 115 kg, SpO2 100 %.        Intake/Output Summary (Last 24 hours) at 06/17/2021 0945 Last data filed at 06/17/2021 0415 Gross per 24 hour  Intake 42.17 ml  Output 950 ml  Net -907.83 ml    Filed Weights   06/14/21 0413 06/16/21 0417 06/17/21 0406  Weight: 121.8 kg 115 kg 115 kg    Examination: Frail elderly man laying in bed Mild crackles R>L, no accessory muscle use Abdomen protuberant, hypoactive BS Ext with no edema Moves all 4 ext to command but profoundly weak   Resolved Hospital Problem list     Assessment & Plan:   Acute Hypoxic/Hypercarbic Respiratory Failure in setting pseudomonas pneumonia and aspiration pneumonia,  Segmental/Subsegmental RLL PE Stage IIb squamous cell lung cancer RUL treated with XRT 01/2021 Bronchiectasis and RLL aspiration at baseline  Plan Continue zosyn, would plan 14 days given bronchiectasis Nebulized therapies: albuterol and hypertonic saline Continue flutter valve, incentive spirometry, wife will work on him with this Continue chest PT with RT bid NTS prn Aspiration precautions Warfarin for known PE Bipap- can try PRN this weekend  Subdural Hematoma  Concern for Seizure > transient episode of decreased responsiveness and right upper extremity jerking  Altered Mental Status  Plan Per neurology  Muscular deconditioning- work on mobility Dysphagia- modified diet Constipation- enema + suppository, continue miralax  PCCM will continue to follow, will see again Monday, call if any issues over weekend  Best Practice (right click and "Reselect all SmartList Selections" daily)   Remaining management per primary team.   Labs   CBC: Recent  Labs  Lab 06/12/21 0312 06/13/21 0309 06/14/21 0201 06/15/21 0401 06/16/21 0338 06/17/21 0240  WBC 6.7 5.6 5.0 5.4 4.7 5.5  NEUTROABS 6.1  --   --   --   --   --   HGB 10.9* 10.7* 9.9* 9.4* 9.0* 9.2*  HCT  36.8* 35.7* 33.9* 31.6* 30.9* 32.2*  MCV 97.9 99.4 101.2* 100.0 100.0 100.3*  PLT 235 210 200 198 205 213     Basic Metabolic Panel: Recent Labs  Lab 06/11/21 0756 06/12/21 0312 06/13/21 0309 06/15/21 0401 06/16/21 0338 06/17/21 0240  NA  --  137 139 140 140 142  K  --  3.2* 3.9 3.6 3.7 4.0  CL  --  94* 95* 92* 95* 97*  CO2  --  35* 38* 41* 41* 39*  GLUCOSE  --  100* 121* 96 84 118*  BUN  --  14 10 10 13 19   CREATININE  --  0.71 0.55* 0.64 0.73 0.73  CALCIUM  --  8.8* 8.8* 8.4* 8.8* 9.0  MG 2.0 1.8 1.8  --   --   --   PHOS  --  2.8 2.5  --   --   --     GFR: Estimated Creatinine Clearance: 92.3 mL/min (by C-G formula based on SCr of 0.73 mg/dL). Recent Labs  Lab 06/14/21 0201 06/15/21 0401 06/16/21 0338 06/17/21 0240  WBC 5.0 5.4 4.7 5.5     Liver Function Tests: Recent Labs  Lab 06/16/21 0338  AST 26  ALT 16  ALKPHOS 62  BILITOT 0.9  PROT 5.5*  ALBUMIN 1.5*    No results for input(s): LIPASE, AMYLASE in the last 168 hours. No results for input(s): AMMONIA in the last 168 hours.  ABG    Component Value Date/Time   PHART 7.458 (H) 06/14/2021 2130   PCO2ART 57.5 (H) 06/14/2021 2130   PO2ART 56.3 (L) 06/14/2021 2130   HCO3 40.2 (H) 06/14/2021 2130   TCO2 43 (H) 06/10/2021 0905   O2SAT 92.6 06/14/2021 2130      Coagulation Profile: Recent Labs  Lab 06/13/21 0309 06/14/21 0201 06/15/21 0401 06/16/21 0338 06/17/21 0240  INR 2.5* 2.5* 3.8* 5.7* 3.2*     Cardiac Enzymes: No results for input(s): CKTOTAL, CKMB, CKMBINDEX, TROPONINI in the last 168 hours.  HbA1C: Hgb A1c MFr Bld  Date/Time Value Ref Range Status  06/17/2021 02:40 AM 6.4 (H) 4.8 - 5.6 % Final    Comment:    (NOTE) Pre diabetes:          5.7%-6.4%  Diabetes:              >6.4%  Glycemic control for   <7.0% adults with diabetes     CBG: Recent Labs  Lab 06/16/21 1625 06/16/21 2049 06/17/21 0003 06/17/21 0402 06/17/21 0621  GLUCAP 227* 219* [place] MD [place] [facility]  Prefer epic messenger for cross cover needs If after hours, please call E-link

## 2021-06-17 NOTE — Progress Notes (Signed)
Speech Language Pathology Treatment: Dysphagia  Patient Details Name: Adrian Foster MRN: [id number] DOB: [birth year] Today's Date: 06/17/2021 Time: 1350-1407 SLP Time Calculation (min) (ACUTE ONLY): 17 min  Assessment / Plan / Recommendation Clinical Impression  Mr. Fluegel was sleeping upon arrival but easily roused.  Mrs. Sweeney was at bedside.  She stated that he was doing better with eating today, and fluids are being pushed to help with bowels. When queried about thickening the water, she stated she'd been filling the cup from the sink and asserted she didn't know he should not drink thin liquids at this time.  We reviewed MBS results again.  She was provided with packets of thickener and provided with 1:1 instruction/demonstration for thickening water.  She verbalized understanding. Pt drank four ounces through straw after repositioning with no overt s/s of aspiration, clear voice, no changes in VS.  Reiterated importance of eating/drinking only while seated in upright position.  SLP will continue to follow.   HPI HPI: Patient is a 87 yo M w/ pertinent pmh of CAD s/p MI, DVT/PE on Coumadin, T2DM, HTN, metastatic prostate cancer w/ mets to lungs on active radiation presents to [facility] on 12/8 after mechanical fall.  CT head showed 5 mm parafalcine subdural hematoma. CXR shows possible RLL pneumonia; given ceftriaxone/azithromycin. Intubated 12/9-10. MBS 12/12, rec regular solids, nectar thick liquids      SLP Plan  Continue with current plan of care      Recommendations for follow up therapy are one component of a multi-disciplinary discharge planning process, led by the attending physician.  Recommendations may be updated based on patient status, additional functional criteria and insurance authorization.    Recommendations  Diet recommendations: Regular;Nectar-thick liquid Liquids provided via: Cup;Straw Medication Administration: Whole meds with puree Supervision: Full  supervision/cueing for compensatory strategies;Patient able to self feed Compensations: Slow rate;Small sips/bites;Effortful swallow;Multiple dry swallows after each bite/sip Postural Changes and/or Swallow Maneuvers: Seated upright 90 degrees                Oral Care Recommendations: Oral care BID Follow Up Recommendations: Acute inpatient rehab (3hours/day) Assistance recommended at discharge: Frequent or constant Supervision/Assistance SLP Visit Diagnosis: Dysphagia, pharyngeal phase (R13.13) Plan: Continue with current plan of care         Percell Lamboy L. Tivis Ringer, [place] CCC/SLP Acute Rehabilitation Services Office number [phone] Pager [phone]   [name]  06/17/2021, 2:12 PM

## 2021-06-17 NOTE — Progress Notes (Signed)
PROGRESS NOTE  Adrian Foster  DOB: [birth year]  PCP: Adrian Poot, MD ATF:[id number]  DOA: 06/22/2021  LOS: 8 days  Hospital Day: 9  Chief Complaint  Patient presents with   Fall    Brief narrative: Adrian Foster is a 87 y.o. 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 Patient was brought to the ED on 12/8 after a mechanical fall while on Coumadin..  In the ED, patient was found to be hypoxic in 70s on room air and required supplemental oxygen. INR was elevated to 8.8 CT chest showed PE within segmental and subsegmental branches on RLL.  CT head showed 5 mm parafalcine subdural hematoma.  Admitted to hospital service Neurosurgery was consulted, did not recommend surgical intervention orders reversal of anticoagulation.  Patient was given vitamin K and also subsequently started on heparin drip  CXR showed possible RLL pneumonia; started on ceftriaxone/azithromycin.  12/9, patient was intubated for encephalopathy and hypoxemic/hypercapnic respiratory failure. 12/10, extubated  12/11, transferred out of ICU  12/12, pulmonary reconsulted for evaluation of worsening right hemithorax with near complete opacification.  12/13, started on BiPAP for hypercapnia.   Subjective: Patient was seen and examined this morning. Pleasant elderly Caucasian male.  Propped up in bed.  Alert, awake, oriented x3 on 4 L/min regular.  Wife at bedside. Patient and family happy after patient passed swallow eval and was started on diet. Labs this morning with INR improving, down to 3.2 this morning. Pending CIR placement.  Assessment/Plan: Acute hypoxic/hypercarbic respiratory failure -Likely secondary to pneumonia.  Also contributed by PE and lung mets.   -Was on mechanical ventilation from 12/9-12/10 and was successfully extubated. However on 12/13, patient was started on BiPAP again for worsening hypoxia.  He  has been intermittently on BiPAP since then. -Currently on 4 L of oxygen by nasal cannula.  Wean down as tolerated.  Nightly BiPAP encouraged.  Aspiration pneumonia Adrian Foster in tracheal aspirate -Chest x-ray 12/12 with near complete opacification of the right hemothorax -Continue IV Zosyn, Mucinex, chest PT, flutter valve, nebulizers -Speech therapy evaluation obtained.  With improvement in mental status, patient swallowing improved.  Currently on regular diet with thin liquid.  Segmental right lower lobe pulmonary embolism Previous history of DVT and PE Supratherapeutic INR -Was on warfarin prior to admission.  He presented with INR elevated to 8.8 and at the same time had bilateral PE which probably could be residual PE from previous episode.   -He also had subdural hematoma because of which INR was initially reverted but later per neurosurgery recommendation of anticoagulation was restarted.   -INR supratherapeutic for last few days, improving, 2.2 today.  Pharmacy following.  To resume Coumadin when INR is between 2-3. Recent Labs  Lab 06/13/21 0309 06/14/21 0201 06/15/21 0401 06/16/21 0338 06/17/21 0240  INR 2.5* 2.5* 3.8* 5.7* 3.2*   Acute metabolic encephalopathy -In the setting of hypoxemia, subdural hematoma, prolonged hospitalization -Mental status stable in last 24 hours.  Haldol as needed.   Subdural hematoma -Patient was noted on CT head on admission.  Small in size.  No intervention per neurosurgery.  He has been cleared to take anticoagulation.   -He is also on Vimpat for presumed seizure.  EEG 12/14 did not show any evidence of definite epileptiform discharge.  History of CAD/MI -PTA on aspirin, statin  Type 2 diabetes mellitus -Was on metformin prior to presentation -Currently on sliding scale insulin with Accu-Cheks. -Blood sugar  level improving after he was initiated on diet.  Plan to resume metformin at discharge Recent Labs  Lab 06/16/21 2049  06/17/21 0003 06/17/21 0402 06/17/21 0621 06/17/21 1004  GLUCAP 219* 175* 124* 113* 168*   Essential hypertension -PTA on torsemide and metolazone.  Both of them are currently on hold.  Blood pressure remains on lower side of normal.  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 Baird Foster.  Has been getting radiation treatments.  Can follow-up with his oncologist at discharge.    Dilated small bowel loops -Noted incidentally on CT scan.  Abdomen is benign.  Continue to monitor.   Indeterminate right renal lesion -Will need outpatient MRI for same.   Severe protein calorie malnutrition -Dietitian consulted.  Supplements ordered  Mobility: Encourage ambulation Living condition: Lives at home Goals of care:   Code Status: Full Code  Nutritional status: Body mass index is 31.69 kg/m.  Nutrition Problem: Severe Malnutrition Etiology: chronic illness, Foster and Foster related treatments Signs/Symptoms: severe muscle depletion, severe fat depletion Diet:  Diet Order             Diet regular Room service appropriate? Yes with Assist; Fluid consistency: Nectar Thick  Diet effective now                  DVT prophylaxis: Coumadin    Antimicrobials: IV Zosyn Fluid: None Consultants: Neurology, pulmonology Family Communication: Wife at bedside  Status is: Inpatient  Continue in-hospital care because: Pending CIR Level of care: Progressive   Dispo: The patient is from: Home              Anticipated d/c is to: CIR              Patient currently is medically stable to d/c.   Difficult to place patient No     Infusions:   sodium chloride 10 mL/hr at 06/16/21 1135   sodium chloride 75 mL/hr at 06/17/21 1029   piperacillin-tazobactam (ZOSYN)  IV 3.375 g (06/17/21 0549)    Scheduled Meds:  aspirin EC  81 mg Oral Daily   bisacodyl  10 mg Rectal Q0600   Chlorhexidine Gluconate  Cloth  6 each Topical Q0600   docusate sodium  100 mg Oral BID   insulin aspart  0-5 Units Subcutaneous QHS   insulin aspart  0-9 Units Subcutaneous TID WC   lacosamide  50 mg Oral BID   multivitamin with minerals  1 tablet Oral Daily   pantoprazole  40 mg Oral Daily   polyethylene glycol  17 g Oral Daily   sodium chloride HYPERTONIC  4 mL Nebulization BID   Warfarin - Pharmacist Dosing Inpatient   Does not apply q1600    PRN meds: sodium chloride, albuterol, haloperidol lactate   Antimicrobials: Anti-infectives (From admission, onward)    Start     Dose/Rate Route Frequency Ordered Stop   06/14/21 2200  piperacillin-tazobactam (ZOSYN) IVPB 3.375 g        3.375 g 12.5 mL/hr over 240 Minutes Intravenous Every 8 hours 06/14/21 1212     06/14/21 1300  piperacillin-tazobactam (ZOSYN) IVPB 4.5 g        4.5 g 200 mL/hr over 30 Minutes Intravenous  Once 06/14/21 1212 06/14/21 1600   06/12/21 1200  ceFEPIme (MAXIPIME) 2 g in sodium chloride 0.9 % 100 mL IVPB  Status:  Discontinued  2 g 200 mL/hr over 30 Minutes Intravenous Every 8 hours 06/12/21 1113 06/14/21 1144   06/11/21 1200  cefTRIAXone (ROCEPHIN) 2 g in sodium chloride 0.9 % 100 mL IVPB  Status:  Discontinued        2 g 200 mL/hr over 30 Minutes Intravenous Every 24 hours 06/11/21 1025 06/12/21 1113   06/10/21 1600  cefTRIAXone (ROCEPHIN) 1 g in sodium chloride 0.9 % 100 mL IVPB  Status:  Discontinued        1 g 200 mL/hr over 30 Minutes Intravenous Every 24 hours 06/10/21 1025 06/11/21 1025   06/10/21 1600  azithromycin (ZITHROMAX) 500 mg in sodium chloride 0.9 % 250 mL IVPB  Status:  Discontinued        500 mg 250 mL/hr over 60 Minutes Intravenous Every 24 hours 06/10/21 1025 06/12/21 1113   06/15/2021 1615  cefTRIAXone (ROCEPHIN) 1 g in sodium chloride 0.9 % 100 mL IVPB        1 g 200 mL/hr over 30 Minutes Intravenous  Once 06/25/2021 1605 06/04/2021 1716   06/20/2021 1615  azithromycin (ZITHROMAX) 500 mg in sodium chloride  0.9 % 250 mL IVPB        500 mg 250 mL/hr over 60 Minutes Intravenous  Once 07/02/2021 1605 06/11/2021 1750       Objective: Vitals:   06/17/21 0718 06/17/21 0821  BP:  (!) 93/54  Pulse:    Resp:  20  Temp:  98.1 F (36.7 C)  SpO2: 95% 100%    Intake/Output Summary (Last 24 hours) at 06/17/2021 1253 Last data filed at 06/17/2021 1029 Gross per 24 hour  Intake 296.93 ml  Output 950 ml  Net -653.07 ml   Filed Weights   06/14/21 0413 06/16/21 0417 06/17/21 0406  Weight: 121.8 kg 115 kg 115 kg   Weight change: 0 kg Body mass index is 31.69 kg/m.   Physical Exam: General exam: Elderly Caucasian male.  Not in physical distress.  On nasal cannula Skin: No rashes, lesions or ulcers. HEENT: Atraumatic, normocephalic, no obvious bleeding Lungs: Clear to auscultation bilaterally CVS: Regular rate and rhythm, no murmur GI/Abd soft, nontender, nondistended, bowel sound present CNS: Alert, awake abound x3 Psychiatry: Mood appropriate Extremities: No pedal edema, no calf tenderness  Data Review: I have personally reviewed the laboratory data and studies available.  F/u labs ordered Unresulted Labs (From admission, onward)     Start     Ordered   06/17/21 5621  Basic metabolic panel  Daily,   R     Question:  Specimen collection method  Answer:  Lab=Lab collect   06/16/21 1102   06/13/21 0500  Protime-INR  Daily,   R     Question:  Specimen collection method  Answer:  Lab=Lab collect   06/12/21 1232   06/12/21 0500  CBC  Daily,   R     Question:  Specimen collection method  Answer:  Lab=Lab collect   06/11/21 1144            Signed, Terrilee Croak, MD [facility] 06/17/2021

## 2021-06-17 NOTE — Progress Notes (Signed)
ANTICOAGULATION CONSULT NOTE  Pharmacy Consult for Heparin and Warfarin Indication: pulmonary embolus  Allergies  Allergen Reactions   Azithromycin Nausea And Vomiting   Levofloxacin Diarrhea    Patient Measurements: Height: 6\' 3"  (190.5 cm) Weight: 115 kg (253 lb 8.5 oz) IBW/kg (Calculated) : 84.5  Heparin Dosing Weight: 106.6 kg  Vital Signs: Temp: 98.3 F (36.8 C) (12/16 1200) Temp Source: Oral (12/16 1200) BP: 112/64 (12/16 1200) Pulse Rate: 83 (12/16 0400)  Labs: Recent Labs    06/15/21 0401 06/16/21 0338 06/17/21 0240  HGB 9.4* 9.0* 9.2*  HCT 31.6* 30.9* 32.2*  PLT 198 205 213  LABPROT 37.4* 51.2* 32.9*  INR 3.8* 5.7* 3.2*  CREATININE 0.64 0.73 0.73    Estimated Creatinine Clearance: 92.3 mL/min (by C-G formula based on SCr of 0.73 mg/dL).  Assessment: 87 years of age male with metastatic prostate cancer receiving radiation and on chronic warfarin therapy for history of DVT and PE who was admitted post fall with new small SDH. INR of 8.8 on admission 12/08, Vitamin K 5mg  IV given x1. INR trended down to 1.5. CT showed multiple small pulmonary embolisms. CCM made decision to continue with warfarin - consulted Pharmacy to resume 12/11.   PTA warfarin dose: 5mg  MWF and 2.5mg  all other days.   INR was 2.5 12/12 and 12/13. Per discussion w/ provider, ok to stop heparin infusion 12/13. Patient AMS improved but was made NPO later in the evening and did not receive warfarin dose 12/13. INR 3.8 12/14, warfarin held.   INR continues to rise despite no warfarin since 12/11. Current INR down from 5.7 to 3.2. No s/sx bleeding reported. Discussed with MD. Will continue to hold warfarin due to supratherapeutic INR. CBC remains stable, hgb 9.2, plts 213. Anticipate resuming warfarin 12/17, would resume at lower dose of 2.5mg  daily.  Goal of Therapy:  INR 2-3 Heparin level 0.3-0.7 units/ml Monitor platelets by anticoagulation protocol: Yes   Plan:  Hold warfarin dose  today  Continue to monitor daily INR, H&H and platelets Monitor closely for s/sx bleeding    Thank you for allowing pharmacy to be a part of this patients care.  Ardyth Harps, PharmD Clinical Pharmacist

## 2021-06-17 NOTE — Progress Notes (Signed)
Inpatient Rehabilitation Admissions Coordinator   I will begin Auth with [facility] for a possible Cir admit next week pending his progress and insurance approval.  Danne Baxter, RN, MSN Rehab Admissions Coordinator [phone] 06/17/2021 1:53 PM

## 2021-06-17 NOTE — Plan of Care (Signed)

## 2021-06-18 LAB — BASIC METABOLIC PANEL
Anion gap: 5 (ref 5–15)
BUN: 21 mg/dL (ref 8–23)
CO2: 40 mmol/L — ABNORMAL HIGH (ref 22–32)
Calcium: 8.9 mg/dL (ref 8.9–10.3)
Chloride: 98 mmol/L (ref 98–111)
Creatinine, Ser: 0.66 mg/dL (ref 0.61–1.24)
GFR, Estimated: >60 mL/min (ref >60)
Glucose, Bld: 130 mg/dL — ABNORMAL HIGH (ref 70–99)
Potassium: 3.9 mmol/L (ref 3.5–5.1)
Sodium: 143 mmol/L (ref 135–145)

## 2021-06-18 LAB — CBC
HCT: 31.5 % — ABNORMAL LOW (ref 39.0–52.0)
Hemoglobin: 8.9 g/dL — ABNORMAL LOW (ref 13.0–17.0)
MCH: 29 pg (ref 26.0–34.0)
MCHC: 28.3 g/dL — ABNORMAL LOW (ref 30.0–36.0)
MCV: 102.6 fL — ABNORMAL HIGH (ref 80.0–100.0)
Platelets: 221 10*3/uL (ref 150–400)
RBC: 3.07 MIL/uL — ABNORMAL LOW (ref 4.22–5.81)
RDW: 16.2 % — ABNORMAL HIGH (ref 11.5–15.5)
WBC: 6 10*3/uL (ref 4.0–10.5)
nRBC: 0 % (ref 0.0–0.2)

## 2021-06-18 LAB — GLUCOSE, CAPILLARY
Glucose-Capillary: 181 mg/dL — ABNORMAL HIGH (ref 70–99)
Glucose-Capillary: 115 mg/dL — ABNORMAL HIGH (ref 70–99)
Glucose-Capillary: 163 mg/dL — ABNORMAL HIGH (ref 70–99)
Glucose-Capillary: 197 mg/dL — ABNORMAL HIGH (ref 70–99)
Glucose-Capillary: 150 mg/dL — ABNORMAL HIGH (ref 70–99)

## 2021-06-18 LAB — PROTIME-INR
INR: 5.7 (ref 0.8–1.2)
INR: 2.1 — ABNORMAL HIGH (ref 0.8–1.2)
Prothrombin Time: 51.2 seconds — ABNORMAL HIGH (ref 11.4–15.2)
Prothrombin Time: 23.8 seconds — ABNORMAL HIGH (ref 11.4–15.2)

## 2021-06-18 MED ORDER — WARFARIN SODIUM 2.5 MG PO TABS
2.5000 mg | ORAL_TABLET | Freq: Once | ORAL | Status: AC
  Administered 2021-06-18: 2.5 mg via ORAL
  Filled 2021-06-18: qty 1

## 2021-06-18 NOTE — Plan of Care (Signed)

## 2021-06-18 NOTE — Progress Notes (Signed)
PROGRESS NOTE  Adrian Foster  DOB: May 03, 1934  PCP: Charleston Poot, MD AOZ:[id number]  DOA: 06/17/2021  LOS: 9 days  Hospital Day: 10  Chief Complaint  Patient presents with   Fall    Brief narrative: Adrian Foster is a 87 y.o. male with PMH significant for DM2, HTN, CAD/MI, DVT/PE on Coumadin, metastatic prostate cancer with mets to lungs on active radiation. Patient was brought to the ED on 12/8 after a mechanical fall while on Coumadin..  In the ED, patient was found to be hypoxic in 70s on room air and required supplemental oxygen. INR was elevated to 8.8 CT chest showed PE within segmental and subsegmental branches on RLL.  CT head showed 5 mm parafalcine subdural hematoma.  Admitted to hospital service Neurosurgery was consulted, did not recommend surgical intervention orders reversal of anticoagulation.  Patient was given vitamin K and also subsequently started on heparin drip  CXR showed possible RLL pneumonia; started on ceftriaxone/azithromycin.  12/9, patient was intubated for encephalopathy and hypoxemic/hypercapnic respiratory failure. 12/10, extubated  12/11, transferred out of ICU  12/12, pulmonary reconsulted for evaluation of worsening right hemithorax with near complete opacification.  12/13, started on BiPAP for hypercapnia.   Subjective: Patient was seen and examined this morning. Pleasant elderly Caucasian male.  Propped up in bed. Alert, awake abound x3.  Not in distress.  Wife at bedside.  On 2 L oxygen by nasal cannula. Pending CIR.  Assessment/Plan: Acute hypoxic/hypercarbic respiratory failure -Likely secondary to pneumonia.  Also contributed by PE and lung mets.   -Was on mechanical ventilation from 12/9-12/10 and was successfully extubated. However on 12/13, patient was started on BiPAP again for worsening hypoxia.  He has been intermittently on BiPAP since then. -Currently on 3 L of oxygen by nasal cannula.  Wean down as tolerated.  Nightly BiPAP  encouraged.  Aspiration pneumonia Pseudomonas in tracheal aspirate -Chest x-ray 12/12 with near complete opacification of the right hemothorax -Continue IV Zosyn, Mucinex, chest PT, flutter valve, nebulizers -Speech therapy evaluation obtained.  With improvement in mental status, patient swallowing improved.  Currently on regular diet with thin liquid.  Segmental right lower lobe pulmonary embolism Previous history of DVT and PE Supratherapeutic INR -Was on warfarin prior to admission.  He presented with INR elevated to 8.8 and at the same time had bilateral PE which probably could be residual PE from previous episode.   -He also had subdural hematoma because of which INR was initially reverted but later per neurosurgery recommendation of anticoagulation was restarted.   -INR supratherapeutic for last few days, improving, 2.1 today.  Pharmacy following.  To resume Coumadin when INR is between 2-3. Recent Labs  Lab 06/14/21 0201 06/15/21 0401 06/16/21 0338 06/17/21 0240 06/18/21 0211  INR 2.5* 3.8* 5.7* 3.2* 2.1*    Acute metabolic encephalopathy -In the setting of hypoxemia, subdural hematoma, prolonged hospitalization -Mental status stable in last 24 hours.  Haldol as needed.   Subdural hematoma -Patient was noted on CT head on admission.  Small in size.  No intervention per neurosurgery.  He has been cleared to take anticoagulation.   -He is also on Vimpat for presumed seizure.  EEG 12/14 did not show any evidence of definite epileptiform discharge.  History of CAD/MI -PTA on aspirin, statin  Type 2 diabetes mellitus -Was on metformin prior to presentation -Currently on sliding scale insulin with Accu-Cheks. -Blood sugar level improving after he was initiated on diet.  Plan to resume metformin at discharge Recent Labs  Lab 06/17/21 1733 06/17/21 2106 06/17/21 2342 06/18/21 0442 06/18/21 1157  GLUCAP 181* 199* 171* 115* 163*    Essential hypertension -PTA on  torsemide and metolazone.  Both of them are currently on hold.  Blood pressure remains on lower side of normal.  Continue to monitor.Marland Kitchen   History of metastatic prostate cancer with mets to lungs -Gets treatment at [facility] under the care of Dr. Baird Cancer.  Has been getting radiation treatments.  Can follow-up with his oncologist at discharge.    Dilated small bowel loops -Noted incidentally on CT scan.  Abdomen is benign.  Continue to monitor.   Indeterminate right renal lesion -Will need outpatient MRI for same.   Severe protein calorie malnutrition -Dietitian consulted.  Supplements ordered  Mobility: Encourage ambulation Living condition: Lives at home Goals of care:   Code Status: Full Code  Nutritional status: Body mass index is 33.12 kg/m.  Nutrition Problem: Severe Malnutrition Etiology: chronic illness, cancer and cancer related treatments Signs/Symptoms: severe muscle depletion, severe fat depletion Diet:  Diet Order             Diet regular Room service appropriate? Yes with Assist; Fluid consistency: Nectar Thick  Diet effective now                  DVT prophylaxis: Coumadin  warfarin (COUMADIN) tablet 2.5 mg   Antimicrobials: IV Zosyn Fluid: None Consultants: Neurology, pulmonology Family Communication: Wife at bedside  Status is: Inpatient  Continue in-hospital care because: Pending CIR Level of care: Progressive   Dispo: The patient is from: Home              Anticipated d/c is to: CIR              Patient currently is medically stable to d/c.   Difficult to place patient No     Infusions:   sodium chloride 10 mL/hr at 06/16/21 1135   sodium chloride 75 mL/hr at 06/17/21 1029   piperacillin-tazobactam (ZOSYN)  IV 3.375 g (06/18/21 1244)    Scheduled Meds:  aspirin EC  81 mg Oral Daily   bisacodyl  10 mg Rectal Q0600   Chlorhexidine Gluconate Cloth  6 each Topical Q0600   docusate sodium  100 mg Oral BID   insulin aspart  0-5 Units  Subcutaneous QHS   insulin aspart  0-9 Units Subcutaneous TID WC   lacosamide  50 mg Oral BID   multivitamin with minerals  1 tablet Oral Daily   pantoprazole  40 mg Oral Daily   polyethylene glycol  17 g Oral Daily   warfarin  2.5 mg Oral ONCE-1600   Warfarin - Pharmacist Dosing Inpatient   Does not apply q1600    PRN meds: sodium chloride, albuterol, haloperidol lactate   Antimicrobials: Anti-infectives (From admission, onward)    Start     Dose/Rate Route Frequency Ordered Stop   06/14/21 2200  piperacillin-tazobactam (ZOSYN) IVPB 3.375 g        3.375 g 12.5 mL/hr over 240 Minutes Intravenous Every 8 hours 06/14/21 1212     06/14/21 1300  piperacillin-tazobactam (ZOSYN) IVPB 4.5 g        4.5 g 200 mL/hr over 30 Minutes Intravenous  Once 06/14/21 1212 06/14/21 1600   06/12/21 1200  ceFEPIme (MAXIPIME) 2 g in sodium chloride 0.9 % 100 mL IVPB  Status:  Discontinued        2 g 200 mL/hr over 30 Minutes Intravenous Every 8 hours 06/12/21 1113  06/14/21 1144   06/11/21 1200  cefTRIAXone (ROCEPHIN) 2 g in sodium chloride 0.9 % 100 mL IVPB  Status:  Discontinued        2 g 200 mL/hr over 30 Minutes Intravenous Every 24 hours 06/11/21 1025 06/12/21 1113   06/10/21 1600  cefTRIAXone (ROCEPHIN) 1 g in sodium chloride 0.9 % 100 mL IVPB  Status:  Discontinued        1 g 200 mL/hr over 30 Minutes Intravenous Every 24 hours 06/10/21 1025 06/11/21 1025   06/10/21 1600  azithromycin (ZITHROMAX) 500 mg in sodium chloride 0.9 % 250 mL IVPB  Status:  Discontinued        500 mg 250 mL/hr over 60 Minutes Intravenous Every 24 hours 06/10/21 1025 06/12/21 1113   06/29/2021 1615  cefTRIAXone (ROCEPHIN) 1 g in sodium chloride 0.9 % 100 mL IVPB        1 g 200 mL/hr over 30 Minutes Intravenous  Once 06/06/2021 1605 06/20/2021 1716   06/29/2021 1615  azithromycin (ZITHROMAX) 500 mg in sodium chloride 0.9 % 250 mL IVPB        500 mg 250 mL/hr over 60 Minutes Intravenous  Once 06/17/2021 1605 06/07/2021 1750        Objective: Vitals:   06/18/21 1151 06/18/21 1214  BP: 107/61   Pulse: 94   Resp: (!) 27 16  Temp: 98.2 F (36.8 C)   SpO2: 96%     Intake/Output Summary (Last 24 hours) at 06/18/2021 1358 Last data filed at 06/18/2021 0821 Gross per 24 hour  Intake 477 ml  Output --  Net 477 ml    Filed Weights   06/16/21 0417 06/17/21 0406 06/18/21 0500  Weight: 115 kg 115 kg 120.2 kg   Weight change: 5.2 kg Body mass index is 33.12 kg/m.   Physical Exam: General exam: Elderly Caucasian male.  Not in physical distress.  On nasal cannula Skin: No rashes, lesions or ulcers. HEENT: Atraumatic, normocephalic, no obvious bleeding Lungs: Clear to auscultation bilaterally CVS: Regular rate and rhythm, no murmur GI/Abd soft, nontender, nondistended, bowel sound present CNS: Alert, awake abound x3 Psychiatry: Mood appropriate Extremities: No pedal edema, no calf tenderness  Data Review: I have personally reviewed the laboratory data and studies available.  F/u labs ordered Unresulted Labs (From admission, onward)     Start     Ordered   06/17/21 1791  Basic metabolic panel  Daily,   R     Question:  Specimen collection method  Answer:  Lab=Lab collect   06/16/21 1102   06/13/21 0500  Protime-INR  Daily,   R     Question:  Specimen collection method  Answer:  Lab=Lab collect   06/12/21 1232   06/12/21 0500  CBC  Daily,   R     Question:  Specimen collection method  Answer:  Lab=Lab collect   06/11/21 1144            Signed, Terrilee Croak, MD [facility] 06/18/2021

## 2021-06-18 NOTE — Progress Notes (Signed)
Physical Therapy Treatment Patient Details Name: Adrian Foster MRN: [id number] DOB: 01-13-1934 Today's Date: 06/18/2021   History of Present Illness Patient is a 87 yo M presents to [facility] on 12/8 after mechanical fall.  CT head showed 5 mm parafalcine subdural hematoma. Found to be hypoxic with transient episodes of responsiveness once tx to ICU. CXR- RLL PNA, Chest CT- PEs. Intubated 12/9-12/10.  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    PT Comments    Pt progressing well towards his physical therapy goals, requiring less assist for bed mobility this date. Performed bed level exercises for BLE strengthening and then transferring to the chair with two person moderate assist and a walker. Remains motivated to participate. Continue to recommend AIR to address deficits, maximize functional mobility and decrease caregiver burden.     Recommendations for follow up therapy are one component of a multi-disciplinary discharge planning process, led by the attending physician.  Recommendations may be updated based on patient status, additional functional criteria and insurance authorization.  Follow Up Recommendations  Acute inpatient rehab (3hours/day)     Assistance Recommended at Discharge Frequent or constant Supervision/Assistance  Equipment Recommendations  Other (comment) (TBA pending progress)    Recommendations for Other Services       Precautions / Restrictions Precautions Precautions: Fall Precaution Comments: keep SBP <160, large hematoma on sacrum Restrictions Weight Bearing Restrictions: No     Mobility  Bed Mobility Overal bed mobility: Needs Assistance Bed Mobility: Supine to Sit     Supine to sit: Min assist     General bed mobility comments: Pt initiating well, bringing BLE's off edge of bed, assist for trunk to upright    Transfers Overall transfer level: Needs assistance Equipment used:  Rolling walker (2 wheels) Transfers: Sit to/from Stand;Bed to chair/wheelchair/BSC Sit to Stand: +2 physical assistance;Mod assist Stand pivot transfers: Mod assist;+2 physical assistance         General transfer comment: Heavy modA + 2 from bed <> recliner, pt with significant trunk/hip flexion, able to move feet minimally to pivot    Ambulation/Gait               General Gait Details: pt unable   Stairs             Wheelchair Mobility    Modified Rankin (Stroke Patients Only) Modified Rankin (Stroke Patients Only) Pre-Morbid Rankin Score: Moderate disability Modified Rankin: Severe disability     Balance Overall balance assessment: Needs assistance Sitting-balance support: Feet supported;Bilateral upper extremity supported Sitting balance-Leahy Scale:  (Fair to Poor) Sitting balance - Comments: anterior lean with fatigue v posterior lean with challenge (i.e. LAQ's)   Standing balance support: Bilateral upper extremity supported;Reliant on assistive device for balance Standing balance-Leahy Scale: Poor                              Cognition Arousal/Alertness: Awake/alert Behavior During Therapy: WFL for tasks assessed/performed Overall Cognitive Status: Impaired/Different from baseline Area of Impairment: Memory;Following commands;Safety/judgement;Problem solving;Attention;Awareness                   Current Attention Level: Sustained Memory: Decreased short-term memory Following Commands: Follows one step commands with increased time Safety/Judgement: Decreased awareness of deficits;Decreased awareness of safety Awareness: Intellectual Problem Solving: Slow processing;Difficulty sequencing;Requires verbal cues;Requires tactile cues;Decreased initiation General Comments: Pleasant and willing to participate.  Exercises General Exercises - Lower Extremity Quad Sets: Both;10 reps;Supine Long Arc Quad: Both;10  reps;Seated Heel Slides: Both;10 reps;Supine Hip ABduction/ADduction: Both;10 reps;Seated    General Comments General comments (skin integrity, edema, etc.): Desat to 87% for brief period on RA, rebounded to 96% on 4L O2,      Pertinent Vitals/Pain Pain Assessment: Faces Faces Pain Scale: No hurt    Home Living                          Prior Function            PT Goals (current goals can now be found in the care plan section) Acute Rehab PT Goals Patient Stated Goal: Per wife, get back to independence PT Goal Formulation: With patient Time For Goal Achievement: 06/26/21 Progress towards PT goals: Progressing toward goals    Frequency    Min 4X/week      PT Plan Current plan remains appropriate    Co-evaluation              AM-PAC PT "6 Clicks" Mobility   Outcome Measure  Help needed turning from your back to your side while in a flat bed without using bedrails?: A Lot Help needed moving from lying on your back to sitting on the side of a flat bed without using bedrails?: Total Help needed moving to and from a bed to a chair (including a wheelchair)?: Total Help needed standing up from a chair using your arms (e.g., wheelchair or bedside chair)?: A Lot Help needed to walk in hospital room?: Total Help needed climbing 3-5 steps with a railing? : Total 6 Click Score: 8    End of Session Equipment Utilized During Treatment: Oxygen;Gait belt Activity Tolerance: Patient tolerated treatment well Patient left: with call bell/phone within reach;with family/visitor present;in chair;with chair alarm set Nurse Communication: Need for lift equipment;Mobility status PT Visit Diagnosis: Hemiplegia and hemiparesis;Unsteadiness on feet (R26.81);Difficulty in walking, not elsewhere classified (R26.2) Hemiplegia - Right/Left: Right Hemiplegia - dominant/non-dominant: Dominant Hemiplegia - caused by: Cerebral infarction     Time: 3903-0092 PT Time  Calculation (min) (ACUTE ONLY): 24 min  Charges:  $Therapeutic Exercise: 8-22 mins $Therapeutic Activity: 8-22 mins                     Wyona Almas, PT, DPT Acute Rehabilitation Services Pager [phone] Office [place] 06/18/2021, 3:17 PM

## 2021-06-18 NOTE — Progress Notes (Signed)
ANTICOAGULATION CONSULT NOTE  Pharmacy Consult for Heparin and Warfarin Indication: pulmonary embolus  Allergies  Allergen Reactions   Azithromycin Nausea And Vomiting   Levofloxacin Diarrhea    Patient Measurements: Height: 6\' 3"  (190.5 cm) Weight: 120.2 kg (264 lb 15.9 oz) IBW/kg (Calculated) : 84.5  Heparin Dosing Weight: 106.6 kg  Vital Signs: Temp: 98.1 F (36.7 C) (12/17 0808) Temp Source: Oral (12/17 0808) BP: 98/53 (12/17 0808) Pulse Rate: 90 (12/17 0808)  Labs: Recent Labs    06/16/21 0338 06/17/21 0240 06/18/21 0211  HGB 9.0* 9.2* 8.9*  HCT 30.9* 32.2* 31.5*  PLT 205 213 221  LABPROT 51.2* 32.9* 23.8*  INR 5.7* 3.2* 2.1*  CREATININE 0.73 0.73 0.66     Estimated Creatinine Clearance: 94.3 mL/min (by C-G formula based on SCr of 0.66 mg/dL).  Assessment: 87 years of age male with metastatic prostate cancer receiving radiation and on chronic warfarin therapy for history of DVT and PE who was admitted post fall with new small SDH. INR of 8.8 on admission 12/08, Vitamin K 5mg  IV given x1. INR trended down to 1.5. CT showed multiple small pulmonary embolisms. CCM made decision to continue with warfarin - consulted Pharmacy to resume 12/11.   PTA warfarin dose: 5mg  MWF and 2.5mg  all other days.   INR was 2.5 12/12 and 12/13. Per discussion w/ provider, ok to stop heparin infusion 12/13. Patient AMS improved but was made NPO later in the evening and did not receive warfarin dose 12/13. INR 3.8 12/14, warfarin held.   INR continued to rise despite no warfarin since 12/11. Current INR down to 2.1 which is within range. No s/sx bleeding reported. Discussed with MD. Will restart warfarin today with lower dose. Current Hgb 8.9.   Goal of Therapy:  INR 2-3 Heparin level 0.3-0.7 units/ml Monitor platelets by anticoagulation protocol: Yes   Plan:  Warfarin 2.5 MG x1 today  Continue to monitor daily INR, H&H and platelets Monitor closely for s/sx  bleeding    Thank you for allowing pharmacy to be a part of this patients care.  Cephus Slater, PharmD, MBA Pharmacy Resident [phone] 06/18/2021 9:07 AM

## 2021-06-19 LAB — BASIC METABOLIC PANEL
Anion gap: 4 — ABNORMAL LOW (ref 5–15)
BUN: 21 mg/dL (ref 8–23)
CO2: 42 mmol/L — ABNORMAL HIGH (ref 22–32)
Calcium: 9.2 mg/dL (ref 8.9–10.3)
Chloride: 98 mmol/L (ref 98–111)
Creatinine, Ser: 0.61 mg/dL (ref 0.61–1.24)
GFR, Estimated: >60 mL/min (ref >60)
Glucose, Bld: 107 mg/dL — ABNORMAL HIGH (ref 70–99)
Potassium: 3.9 mmol/L (ref 3.5–5.1)
Sodium: 144 mmol/L (ref 135–145)

## 2021-06-19 LAB — GLUCOSE, CAPILLARY
Glucose-Capillary: 107 mg/dL — ABNORMAL HIGH (ref 70–99)
Glucose-Capillary: 144 mg/dL — ABNORMAL HIGH (ref 70–99)
Glucose-Capillary: 176 mg/dL — ABNORMAL HIGH (ref 70–99)
Glucose-Capillary: 119 mg/dL — ABNORMAL HIGH (ref 70–99)

## 2021-06-19 LAB — PROTIME-INR
INR: 1.5 — ABNORMAL HIGH (ref 0.8–1.2)
Prothrombin Time: 18.4 seconds — ABNORMAL HIGH (ref 11.4–15.2)

## 2021-06-19 LAB — CBC
HCT: 32.7 % — ABNORMAL LOW (ref 39.0–52.0)
Hemoglobin: 9.1 g/dL — ABNORMAL LOW (ref 13.0–17.0)
MCH: 28.8 pg (ref 26.0–34.0)
MCHC: 27.8 g/dL — ABNORMAL LOW (ref 30.0–36.0)
MCV: 103.5 fL — ABNORMAL HIGH (ref 80.0–100.0)
Platelets: 208 10*3/uL (ref 150–400)
RBC: 3.16 MIL/uL — ABNORMAL LOW (ref 4.22–5.81)
RDW: 16.1 % — ABNORMAL HIGH (ref 11.5–15.5)
WBC: 6.8 10*3/uL (ref 4.0–10.5)
nRBC: 0 % (ref 0.0–0.2)

## 2021-06-19 LAB — MRSA NEXT GEN BY PCR, NASAL: MRSA by PCR Next Gen: NOT DETECTED

## 2021-06-19 MED ORDER — WARFARIN SODIUM 4 MG PO TABS
4.0000 mg | ORAL_TABLET | Freq: Once | ORAL | Status: AC
  Administered 2021-06-19: 17:00:00 4 mg via ORAL
  Filled 2021-06-19: qty 1

## 2021-06-19 MED ORDER — ACETAMINOPHEN 325 MG PO TABS
650.0000 mg | ORAL_TABLET | Freq: Four times a day (QID) | ORAL | Status: DC | PRN
  Administered 2021-06-19 – 2021-06-21 (×2): 650 mg via ORAL
  Filled 2021-06-19 (×2): qty 2

## 2021-06-19 NOTE — Progress Notes (Addendum)
RT passing patient's room heard the Bipap alarm. Upon entering the room the spouse was putting the [place] on the patient.  [place] at 6L, sats 93%. Patient is more awake and the spouse stated the patient took off the mask.  Bipap placed on standby.  The spouse then went to give the patient 3 large cubes of crushed ice.  RT let  the spouse know that the patient can not have anything to eat or drink for an hour due to the positive pressure from the Bipap and the risk of aspiration.  The spouse said ok. The RN was notified.

## 2021-06-19 NOTE — Progress Notes (Signed)
ANTICOAGULATION CONSULT NOTE  Pharmacy Consult for Heparin and Warfarin Indication: pulmonary embolus  Allergies  Allergen Reactions   Azithromycin Nausea And Vomiting   Levofloxacin Diarrhea    Patient Measurements: Height: 6\' 3"  (190.5 cm) Weight: 120.2 kg (264 lb 15.9 oz) IBW/kg (Calculated) : 84.5  Heparin Dosing Weight: 106.6 kg  Vital Signs: Temp: 98 F (36.7 C) (12/18 0730) Temp Source: Oral (12/18 0730) BP: 107/65 (12/18 0806) Pulse Rate: 97 (12/18 0806)  Labs: Recent Labs    06/17/21 0240 06/18/21 0211 06/19/21 0711  HGB 9.2* 8.9* 9.1*  HCT 32.2* 31.5* 32.7*  PLT 213 221 208  LABPROT 32.9* 23.8* 18.4*  INR 3.2* 2.1* 1.5*  CREATININE 0.73 0.66 0.61     Estimated Creatinine Clearance: 94.3 mL/min (by C-G formula based on SCr of 0.61 mg/dL).  Assessment: 87 years of age male with metastatic prostate cancer receiving radiation and on chronic warfarin therapy for history of DVT and PE who was admitted post fall with new small SDH. INR of 8.8 on admission 12/08, Vitamin K 5mg  IV given x1. INR trended down to 1.5. CT showed multiple small pulmonary embolisms. CCM made decision to continue with warfarin - consulted Pharmacy to resume 12/11.   PTA warfarin dose: 5mg  MWF and 2.5mg  all other days.   INR was 2.5 12/12 and 12/13. Per discussion w/ provider, ok to stop heparin infusion 12/13. Patient AMS improved but was made NPO later in the evening and did not receive warfarin dose 12/13. INR 3.8 12/14, warfarin held.   INR continued to rise despite no warfarin since 12/11. Current INR down to 1.5 after 2.5 MG x1 on 12/17. No s/sx bleeding reported. Hgb 9.1 and Plt stable.   Goal of Therapy:  INR 2-3 Heparin level 0.3-0.7 units/ml Monitor platelets by anticoagulation protocol: Yes   Plan:  Warfarin 4 MG x1 today  Continue to monitor daily INR, H&H and platelets Monitor closely for s/sx bleeding    Thank you for allowing pharmacy to be a part of this  patients care.  Cephus Slater, PharmD, MBA Pharmacy Resident [phone] 06/19/2021 11:29 AM

## 2021-06-19 NOTE — Progress Notes (Signed)
RT called to place patient back on Bipap.  Patient tolerating well at this time.  RN at bedside.

## 2021-06-19 NOTE — Plan of Care (Signed)

## 2021-06-19 NOTE — Progress Notes (Signed)
RT called to bedside due to desat.  Upon entering patient's room sats had improved to 94% after RN increased [place] to 6L.  Patient is responding to questions but appears drowsy and is breathing more shallow.  RT placed patient on Bipap 15/5, BR 16, 45%.  RN and patient's spouse are at bedside. RT will continue to monitor.

## 2021-06-19 NOTE — Progress Notes (Signed)
PROGRESS NOTE  Adrian Foster  DOB: [birth year]  PCP: Charleston Poot, MD MLY:[id number]  DOA: 06/28/2021  LOS: 10 days  Hospital Day: 40  Chief Complaint  Patient presents with   Fall    Brief narrative: Adrian Foster is a 87 y.o. male with PMH significant for DM2, HTN, CAD/MI, DVT/PE on Coumadin, metastatic prostate cancer with mets to lungs on active radiation. Patient was brought to the ED on 12/8 after a mechanical fall while on Coumadin..  In the ED, patient was found to be hypoxic in 70s on room air and required supplemental oxygen. INR was elevated to 8.8 CT chest showed PE within segmental and subsegmental branches on RLL.  CT head showed 5 mm parafalcine subdural hematoma.  Admitted to hospital service Neurosurgery was consulted, did not recommend surgical intervention orders reversal of anticoagulation.  Patient was given vitamin K and also subsequently started on heparin drip  CXR showed possible RLL pneumonia; started on ceftriaxone/azithromycin.  12/9, patient was intubated for encephalopathy and hypoxemic/hypercapnic respiratory failure. 12/10, extubated  12/11, transferred out of ICU  12/12, pulmonary reconsulted for evaluation of worsening right hemithorax with near complete opacification.  12/13, started on BiPAP for hypercapnia.   Subjective: Patient was seen and examined this morning. Pleasant elderly Caucasian male.  Propped up in bed.  On 3 L oxygen by nasal collar.  Wife at bedside. Feels better after he had bowel movement today.  Pending CIR.  Assessment/Plan: Acute hypoxic/hypercarbic respiratory failure -Likely secondary to pneumonia.  Also contributed by PE and lung mets.   -Was on mechanical ventilation from 12/9-12/10 and was successfully extubated. However on 12/13, patient was started on BiPAP again for worsening hypoxia.  He has been intermittently on BiPAP since then. -Currently on 3 L of oxygen by nasal cannula.  Wean down as tolerated.  Nightly  BiPAP encouraged.  Aspiration pneumonia Pseudomonas in tracheal aspirate -Chest x-ray 12/12 with near complete opacification of the right hemothorax -Continue IV Zosyn to complete 7-day course.  Continue Mucinex, chest PT, flutter valve, nebulizers -Speech therapy evaluation obtained.  With improvement in mental status, patient swallowing improved.  Currently on regular diet with thin liquid.  Segmental right lower lobe pulmonary embolism Previous history of DVT and PE Supratherapeutic INR -Was on warfarin prior to admission.  He presented with INR elevated to 8.8 and at the same time had bilateral PE which probably could be residual PE from previous episode.   -He also had subdural hematoma because of which INR was initially reverted but later per neurosurgery recommendation of anticoagulation was restarted.   -INR was initially supratherapeutic.  Gradually improved.  1.5 today.  Coumadin resumed.  Pharmacy following.   Recent Labs  Lab 06/15/21 0401 06/16/21 0338 06/17/21 0240 06/18/21 0211 06/19/21 0711  INR 3.8* 5.7* 3.2* 2.1* 1.5*    Acute metabolic encephalopathy -In the setting of hypoxemia, subdural hematoma, prolonged hospitalization -Mental status stable in last 24 hours.  Haldol as needed.   Subdural hematoma -Patient was noted on CT head on admission.  Small in size.  No intervention per neurosurgery.  He has been cleared to take anticoagulation.   -He is also on Vimpat for presumed seizure.  EEG 12/14 did not show any evidence of definite epileptiform discharge.  History of CAD/MI -PTA on aspirin, statin  Type 2 diabetes mellitus -Was on metformin prior to presentation -Currently on sliding scale insulin with Accu-Cheks. -Blood sugar level improved after he was initiated on diet.  Plan is to resume metformin  at discharge Recent Labs  Lab 06/18/21 0442 06/18/21 1157 06/18/21 1723 06/18/21 2151 06/19/21 0634  GLUCAP 115* 163* 197* 150* 107*    Essential  hypertension -PTA on torsemide and metolazone.  Both of them are currently on hold.  Blood pressure remains on lower side of normal.  Continue to monitor.Marland Kitchen   History of metastatic prostate cancer with mets to lungs -Gets treatment at [facility] under the care of Dr. Baird Cancer.  Has been getting radiation treatments.  Can follow-up with his oncologist at discharge.    Dilated small bowel loops -Noted incidentally on CT scan.  Abdomen is benign.  Continue to monitor.   Indeterminate right renal lesion -Will need outpatient MRI for same.   Severe protein calorie malnutrition -Dietitian consulted.  Supplements ordered  Mobility: Encourage ambulation Living condition: Lives at home Goals of care:   Code Status: Full Code  Nutritional status: Body mass index is 33.12 kg/m.  Nutrition Problem: Severe Malnutrition Etiology: chronic illness, cancer and cancer related treatments Signs/Symptoms: severe muscle depletion, severe fat depletion Diet:  Diet Order             Diet regular Room service appropriate? Yes with Assist; Fluid consistency: Nectar Thick  Diet effective now                  DVT prophylaxis: Coumadin    Antimicrobials: IV Zosyn Fluid: None Consultants: Neurology, pulmonology Family Communication: Wife at bedside  Status is: Inpatient  Continue in-hospital care because: Pending CIR Level of care: Progressive   Dispo: The patient is from: Home              Anticipated d/c is to: CIR              Patient currently is medically stable to d/c.   Difficult to place patient No     Infusions:   sodium chloride 10 mL/hr at 06/16/21 1135   sodium chloride 75 mL/hr at 06/18/21 1949   piperacillin-tazobactam (ZOSYN)  IV 3.375 g (06/19/21 0622)    Scheduled Meds:  aspirin EC  81 mg Oral Daily   bisacodyl  10 mg Rectal Q0600   Chlorhexidine Gluconate Cloth  6 each Topical Q0600   docusate sodium  100 mg Oral BID   insulin aspart  0-5 Units Subcutaneous QHS    insulin aspart  0-9 Units Subcutaneous TID WC   lacosamide  50 mg Oral BID   multivitamin with minerals  1 tablet Oral Daily   pantoprazole  40 mg Oral Daily   polyethylene glycol  17 g Oral Daily   Warfarin - Pharmacist Dosing Inpatient   Does not apply q1600    PRN meds: sodium chloride, albuterol, haloperidol lactate   Antimicrobials: Anti-infectives (From admission, onward)    Start     Dose/Rate Route Frequency Ordered Stop   06/14/21 2200  piperacillin-tazobactam (ZOSYN) IVPB 3.375 g        3.375 g 12.5 mL/hr over 240 Minutes Intravenous Every 8 hours 06/14/21 1212     06/14/21 1300  piperacillin-tazobactam (ZOSYN) IVPB 4.5 g        4.5 g 200 mL/hr over 30 Minutes Intravenous  Once 06/14/21 1212 06/14/21 1600   06/12/21 1200  ceFEPIme (MAXIPIME) 2 g in sodium chloride 0.9 % 100 mL IVPB  Status:  Discontinued        2 g 200 mL/hr over 30 Minutes Intravenous Every 8 hours 06/12/21 1113 06/14/21 1144   06/11/21 1200  cefTRIAXone (  ROCEPHIN) 2 g in sodium chloride 0.9 % 100 mL IVPB  Status:  Discontinued        2 g 200 mL/hr over 30 Minutes Intravenous Every 24 hours 06/11/21 1025 06/12/21 1113   06/10/21 1600  cefTRIAXone (ROCEPHIN) 1 g in sodium chloride 0.9 % 100 mL IVPB  Status:  Discontinued        1 g 200 mL/hr over 30 Minutes Intravenous Every 24 hours 06/10/21 1025 06/11/21 1025   06/10/21 1600  azithromycin (ZITHROMAX) 500 mg in sodium chloride 0.9 % 250 mL IVPB  Status:  Discontinued        500 mg 250 mL/hr over 60 Minutes Intravenous Every 24 hours 06/10/21 1025 06/12/21 1113   06/16/2021 1615  cefTRIAXone (ROCEPHIN) 1 g in sodium chloride 0.9 % 100 mL IVPB        1 g 200 mL/hr over 30 Minutes Intravenous  Once 06/06/2021 1605 06/08/2021 1716   06/07/2021 1615  azithromycin (ZITHROMAX) 500 mg in sodium chloride 0.9 % 250 mL IVPB        500 mg 250 mL/hr over 60 Minutes Intravenous  Once 06/04/2021 1605 06/24/2021 1750       Objective: Vitals:   06/19/21 0730 06/19/21  0806  BP: 107/65 107/65  Pulse: 93 97  Resp: 20 16  Temp: 98 F (36.7 C)   SpO2: 95% 94%    Intake/Output Summary (Last 24 hours) at 06/19/2021 0908 Last data filed at 06/19/2021 0301 Gross per 24 hour  Intake 236 ml  Output 650 ml  Net -414 ml    Filed Weights   06/16/21 0417 06/17/21 0406 06/18/21 0500  Weight: 115 kg 115 kg 120.2 kg   Weight change:  Body mass index is 33.12 kg/m.   Physical Exam: General exam: Elderly Caucasian male.  Not in physical distress.  On nasal cannula Skin: No rashes, lesions or ulcers. HEENT: Atraumatic, normocephalic, no obvious bleeding Lungs: Clear to auscultation bilaterally CVS: Regular rate and rhythm, no murmur GI/Abd soft, nontender, nondistended, bowel sound present CNS: Alert, awake abound x3 Psychiatry: Mood appropriate Extremities: No pedal edema, no calf tenderness  Data Review: I have personally reviewed the laboratory data and studies available.  F/u labs ordered Unresulted Labs (From admission, onward)     Start     Ordered   06/19/21 0846  MRSA Next Gen by PCR, Nasal  Once,   R        06/19/21 0846   06/13/21 0500  Protime-INR  Daily,   R     Question:  Specimen collection method  Answer:  Lab=Lab collect   06/12/21 1232   06/12/21 0500  CBC  Daily,   R     Question:  Specimen collection method  Answer:  Lab=Lab collect   06/11/21 1144            Signed, Terrilee Croak, MD [facility] 06/19/2021

## 2021-06-19 NOTE — Progress Notes (Signed)
Patient O2 saturation suddenly fell to mid 60s, HR 105-110, BP 107/64 (77), RR 22. RN responded to family call, patient asleep, responded appropriately to request for name, but lethargic. Charge Nurse Levada Dy called, RT called, patient placed on 6L nasal cannula, when RT arrived, pt put on BIPAP while sleeping. Will continue to monitor.

## 2021-06-20 ENCOUNTER — Inpatient Hospital Stay (HOSPITAL_COMMUNITY): Payer: Medicare Other

## 2021-06-20 ENCOUNTER — Other Ambulatory Visit (HOSPITAL_COMMUNITY): Payer: Self-pay

## 2021-06-20 DIAGNOSIS — J80 Acute respiratory distress syndrome: Secondary | ICD-10-CM

## 2021-06-20 LAB — BLOOD GAS, ARTERIAL
Acid-Base Excess: 19.3 mmol/L — ABNORMAL HIGH (ref 0.0–2.0)
Bicarbonate: 46.2 mmol/L — ABNORMAL HIGH (ref 20.0–28.0)
FIO2: 40
O2 Saturation: 94.6 %
Patient temperature: 36.8
pCO2 arterial: 89.9 mmHg (ref 32.0–48.0)
pH, Arterial: 7.33 — ABNORMAL LOW (ref 7.350–7.450)
pO2, Arterial: 69.7 mmHg — ABNORMAL LOW (ref 83.0–108.0)

## 2021-06-20 LAB — CBC WITH DIFFERENTIAL/PLATELET
Abs Immature Granulocytes: 0.03 10*3/uL (ref 0.00–0.07)
Basophils Absolute: 0 10*3/uL (ref 0.0–0.1)
Basophils Relative: 0 %
Eosinophils Absolute: 0.1 10*3/uL (ref 0.0–0.5)
Eosinophils Relative: 2 %
HCT: 31.1 % — ABNORMAL LOW (ref 39.0–52.0)
Hemoglobin: 8.8 g/dL — ABNORMAL LOW (ref 13.0–17.0)
Immature Granulocytes: 1 %
Lymphocytes Relative: 6 %
Lymphs Abs: 0.3 10*3/uL — ABNORMAL LOW (ref 0.7–4.0)
MCH: 29.8 pg (ref 26.0–34.0)
MCHC: 28.3 g/dL — ABNORMAL LOW (ref 30.0–36.0)
MCV: 105.4 fL — ABNORMAL HIGH (ref 80.0–100.0)
Monocytes Absolute: 0.3 10*3/uL (ref 0.1–1.0)
Monocytes Relative: 6 %
Neutro Abs: 4.6 10*3/uL (ref 1.7–7.7)
Neutrophils Relative %: 85 %
Platelets: 214 10*3/uL (ref 150–400)
RBC: 2.95 MIL/uL — ABNORMAL LOW (ref 4.22–5.81)
RDW: 16.1 % — ABNORMAL HIGH (ref 11.5–15.5)
WBC: 5.3 10*3/uL (ref 4.0–10.5)
nRBC: 0 % (ref 0.0–0.2)

## 2021-06-20 LAB — BASIC METABOLIC PANEL
BUN: 24 mg/dL — ABNORMAL HIGH (ref 8–23)
CO2: >45 mmol/L — ABNORMAL HIGH (ref 22–32)
Calcium: 9 mg/dL (ref 8.9–10.3)
Chloride: 97 mmol/L — ABNORMAL LOW (ref 98–111)
Creatinine, Ser: 0.61 mg/dL (ref 0.61–1.24)
GFR, Estimated: >60 mL/min (ref >60)
Glucose, Bld: 106 mg/dL — ABNORMAL HIGH (ref 70–99)
Potassium: 3.9 mmol/L (ref 3.5–5.1)
Sodium: 145 mmol/L (ref 135–145)

## 2021-06-20 LAB — CBC
HCT: 31 % — ABNORMAL LOW (ref 39.0–52.0)
Hemoglobin: 8.5 g/dL — ABNORMAL LOW (ref 13.0–17.0)
MCH: 29 pg (ref 26.0–34.0)
MCHC: 27.4 g/dL — ABNORMAL LOW (ref 30.0–36.0)
MCV: 105.8 fL — ABNORMAL HIGH (ref 80.0–100.0)
Platelets: 209 10*3/uL (ref 150–400)
RBC: 2.93 MIL/uL — ABNORMAL LOW (ref 4.22–5.81)
RDW: 16.1 % — ABNORMAL HIGH (ref 11.5–15.5)
WBC: 5.1 10*3/uL (ref 4.0–10.5)
nRBC: 0 % (ref 0.0–0.2)

## 2021-06-20 LAB — PROTIME-INR
INR: 1.5 — ABNORMAL HIGH (ref 0.8–1.2)
Prothrombin Time: 18 seconds — ABNORMAL HIGH (ref 11.4–15.2)

## 2021-06-20 LAB — GLUCOSE, CAPILLARY
Glucose-Capillary: 99 mg/dL (ref 70–99)
Glucose-Capillary: 122 mg/dL — ABNORMAL HIGH (ref 70–99)
Glucose-Capillary: 184 mg/dL — ABNORMAL HIGH (ref 70–99)
Glucose-Capillary: 162 mg/dL — ABNORMAL HIGH (ref 70–99)

## 2021-06-20 LAB — PROCALCITONIN: Procalcitonin: 21.03 ng/mL

## 2021-06-20 IMAGING — DX DG CHEST 1V PORT
1 series · 1 of 1 positions shown · non-contrast
Comparison: [DATE]

CLINICAL DATA: Oxygen desaturation

EXAM:
PORTABLE CHEST 1 VIEW

[chest]
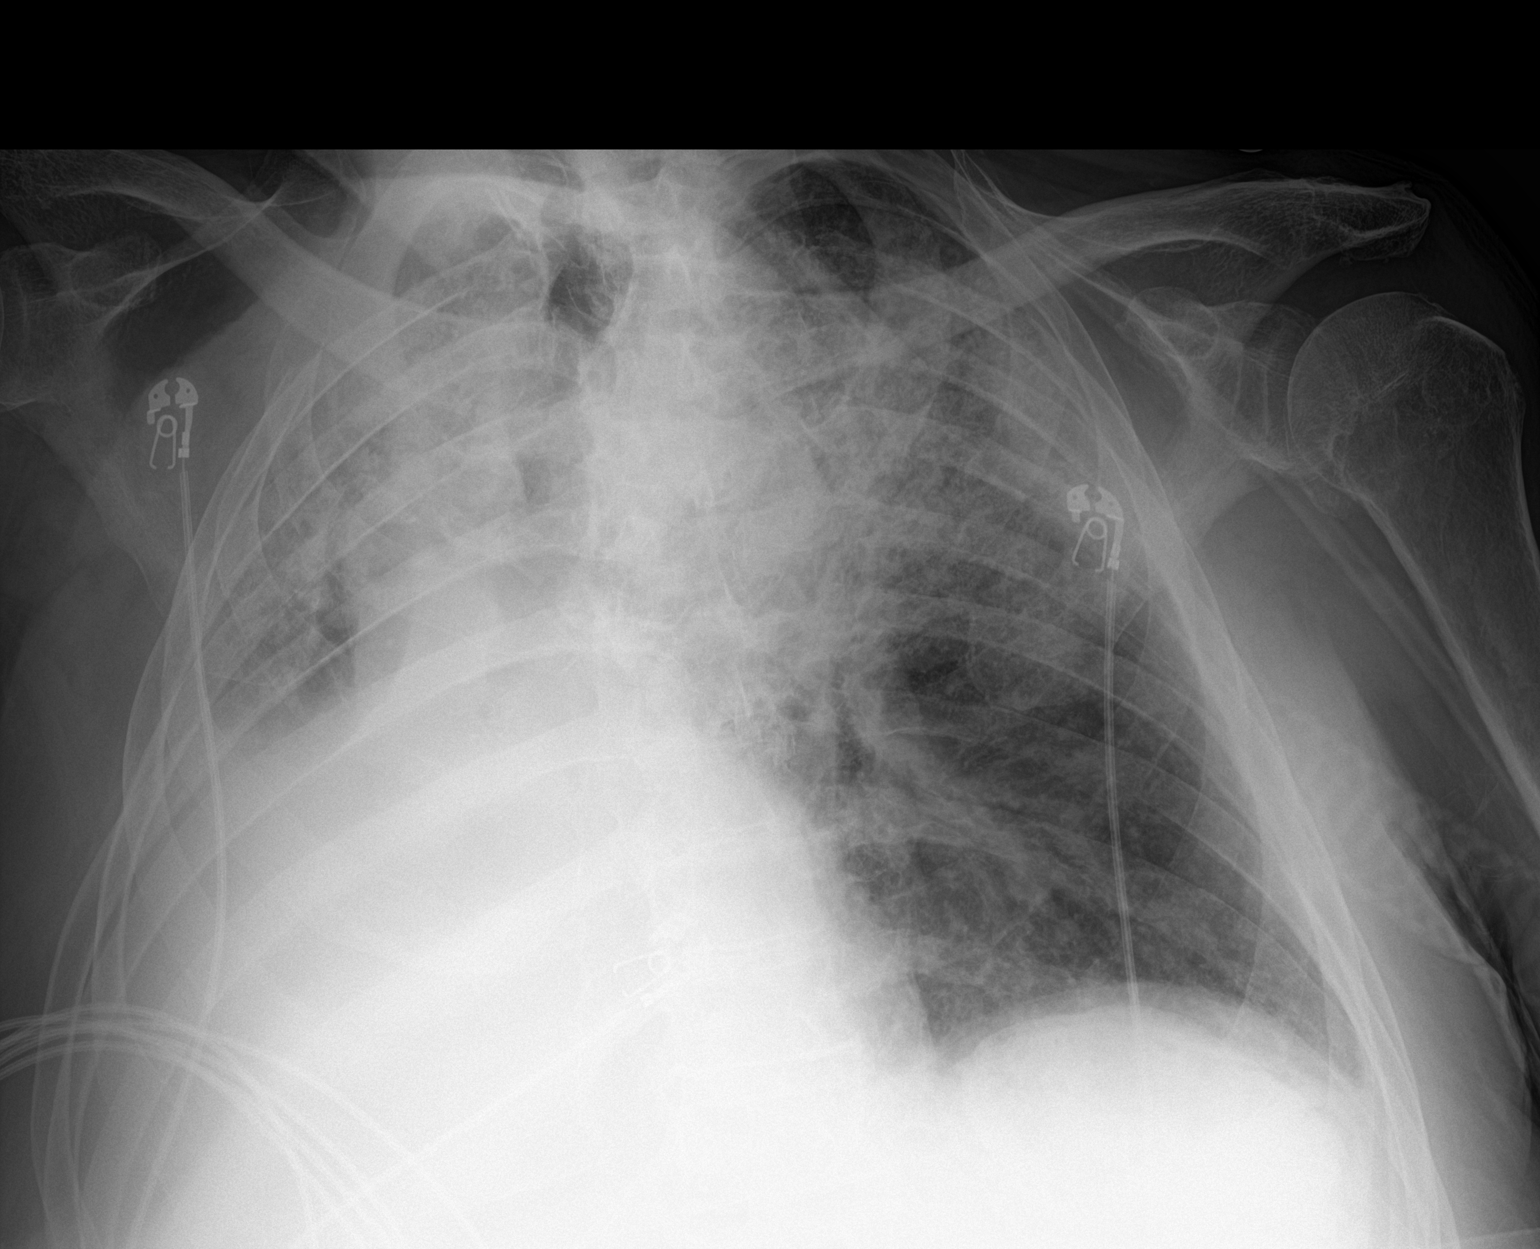

[1 of 1 positions shown; findings below may reference images not displayed]

FINDINGS: Worsening findings in the right chest. Further pleural fluid
accumulation and volume loss of the right lung. Aerated portion of
the right upper lung shows abnormal density consistent with
pneumonia. Left lower lung is largely clear. Persistent infiltrate
in the left upper lobe.
IMPRESSION: Worsening pleural fluid, volume loss and infiltrate in the right
lung. Persistent left upper lobe pneumonia.

## 2021-06-20 MED ORDER — FUROSEMIDE 10 MG/ML IJ SOLN
40.0000 mg | Freq: Once | INTRAMUSCULAR | Status: AC
  Administered 2021-06-20: 11:00:00 40 mg via INTRAVENOUS
  Filled 2021-06-20: qty 4

## 2021-06-20 MED ORDER — IPRATROPIUM-ALBUTEROL 0.5-2.5 (3) MG/3ML IN SOLN
3.0000 mL | Freq: Four times a day (QID) | RESPIRATORY_TRACT | Status: DC
  Administered 2021-06-20 – 2021-06-23 (×12): 3 mL via RESPIRATORY_TRACT
  Filled 2021-06-20 (×12): qty 3

## 2021-06-20 MED ORDER — WARFARIN SODIUM 4 MG PO TABS
4.0000 mg | ORAL_TABLET | Freq: Once | ORAL | Status: AC
  Administered 2021-06-20: 17:00:00 4 mg via ORAL
  Filled 2021-06-20: qty 1

## 2021-06-20 MED ORDER — SODIUM CHLORIDE 3 % IN NEBU
4.0000 mL | INHALATION_SOLUTION | Freq: Two times a day (BID) | RESPIRATORY_TRACT | Status: DC
  Administered 2021-06-20 – 2021-06-23 (×7): 4 mL via RESPIRATORY_TRACT
  Filled 2021-06-20 (×10): qty 4

## 2021-06-20 NOTE — Progress Notes (Addendum)
Around 3664, Patient oxygen saturation dropped to low 60s and high 50s after taking off BiPAP. Patient followed commands and opened eyes slightly before closing them, blowing into flutter valve.   RT was called and patient was placed back on BiPAP, attempted to test alertness and patient required sternal rub x2 (second time stiffer than the first sternal rub) for grunt of a "huh?" From patient. Rapid response was called, blood gas labs were ordered and Chest x-ray was ordered as well.   Wife at bedside experiencing anxiousness and concerned for patient's wellbeing at this time.   Handoff to Bess Harvest, RN whom has taken care of patient for a few days now.     06/20/21 0654  Assess: MEWS Score  BP 117/65  Pulse Rate 81  ECG Heart Rate 80  Resp 19  Level of Consciousness Responds to Pain  SpO2 99 %  O2 Device Bi-PAP  Patient Activity (if Appropriate) In bed  FiO2 (%) 30 %  Assess: MEWS Score  MEWS Temp 0  MEWS Systolic 0  MEWS Pulse 0  MEWS RR 0  MEWS LOC 2  MEWS Score 2  MEWS Score Color Yellow  Assess: if the MEWS score is Yellow or Red  Were vital signs taken at a resting state? Yes  Focused Assessment Change from prior assessment (see assessment flowsheet)  Early Detection of Sepsis Score *See Row Information* Low  MEWS guidelines implemented *See Row Information* No, vital signs rechecked  Treat  MEWS Interventions Consulted Respiratory Therapy (Terri, RT)  Escalate  MEWS: Escalate Yellow: discuss with charge nurse/RN and consider discussing with provider and RRT  Notify: Charge Nurse/RN  Name of Charge Nurse/RN Notified Psychiatrist (Dayshift), Jerolyn Center (Night Shift)  Date Charge Nurse/RN Notified 06/20/21  Time Charge Nurse/RN Notified 4034  Notify: Provider  Provider Name/Title Dahal MD  Date Provider Notified 06/20/21  Time Provider Notified [phone]  Notification Type Page  Notification Reason Change in status  Provider response See new orders;En route   Date of Provider Response 06/20/21  Time of Provider Response [phone]  Notify: Rapid Response  Date Rapid Response Notified 06/20/21  Time Rapid Response Notified 0702  Document  Patient Outcome Stabilized after interventions;Not stable and remains on department (In-between stability, Placed on BiPap and VS are now stable. patient intolerant of RA or [place] at this time.)  Progress note created (see row info) Yes (By RT Karna Christmas)

## 2021-06-20 NOTE — Progress Notes (Signed)
ANTICOAGULATION CONSULT NOTE  Pharmacy Consult for warfarin Indication: pulmonary embolus and hx DVT/PE  Allergies  Allergen Reactions   Azithromycin Nausea And Vomiting   Levofloxacin Diarrhea    Patient Measurements: Height: 6\' 3"  (190.5 cm) Weight: 120.9 kg (266 lb 8.6 oz) IBW/kg (Calculated) : 84.5 Heparin Dosing Weight: 106.6 kg  Vital Signs: Temp: 99 F (37.2 C) (12/19 1143) Temp Source: Oral (12/19 1143) BP: 106/64 (12/19 1143) Pulse Rate: 87 (12/19 1143)  Labs: Recent Labs    06/18/21 0211 06/19/21 0711 06/20/21 0711  HGB 8.9* 9.1* 8.8*   8.5*  HCT 31.5* 32.7* 31.1*   31.0*  PLT 221 208 214   209  LABPROT 23.8* 18.4* 18.0*  INR 2.1* 1.5* 1.5*  CREATININE 0.66 0.61 0.61     Estimated Creatinine Clearance: 94.6 mL/min (by C-G formula based on SCr of 0.61 mg/dL).  Assessment: 87 years of age male with metastatic prostate cancer receiving radiation and on chronic warfarin therapy for history of DVT and PE who was admitted post fall with new small SDH. INR of 8.8 on admission 12/08, Vitamin K 5mg  IV given x1. INR trended down to 1.5. CT showed multiple small pulmonary embolisms. CCM made decision to continue with warfarin - consulted Pharmacy to resume 12/11.   PTA warfarin dose: 5mg  MWF and 2.5mg  all other days.   INR was 2.5 12/12 and 12/13. Per discussion w/ provider, ok to stop heparin infusion 12/13. Patient AMS improved but was made NPO later in the evening and did not receive warfarin dose 12/13. INR 3.8 12/14, warfarin held.   Received no warfarin 12/12 - 12/16 due to supratherapeutic INRs. INR subtherapeutic at 1.5 today. No bleeding noted, Hgb low in 8-9s, platelets are normal.  12/19: I spoke with patient and wife about changing to apixaban. Provided wife education sheet and copay information - she is not interested in changing right now but will consider. Copay $47/month.  Goal of Therapy:  INR 2-3 Monitor platelets by anticoagulation protocol:  Yes   Plan:  Repeat warfarin 4 mg PO today  Continue to monitor daily INR, H&H and platelets Monitor closely for s/sx bleeding    Thank you for involving pharmacy in this patient's care.  Renold Genta, PharmD, BCPS Clinical Pharmacist Clinical phone for 06/20/2021 until 3p is [phone] 06/20/2021 2:10 PM  **Pharmacist phone directory can be found on [email or web address] listed under [place]**

## 2021-06-20 NOTE — Progress Notes (Signed)
PROGRESS NOTE  Adrian Foster  DOB: 03-Jul-1934  PCP: Charleston Poot, MD LZJ:[id number]  DOA: 06/21/2021  LOS: 11 days  Hospital Day: 12  Chief Complaint  Patient presents with   Fall    Brief narrative: Adrian Foster is a 87 y.o. male with PMH significant for DM2, HTN, CAD/MI, DVT/PE on Coumadin, metastatic prostate cancer with mets to lungs on active radiation. Patient was brought to the ED on 12/8 after a mechanical fall while on Coumadin..  In the ED, patient was found to be hypoxic in 70s on room air and required supplemental oxygen. INR was elevated to 8.8 CT chest showed PE within segmental and subsegmental branches on RLL.  CT head showed 5 mm parafalcine subdural hematoma.  Admitted to hospital service Neurosurgery was consulted, did not recommend surgical intervention orders reversal of anticoagulation.  Patient was given vitamin K and also subsequently started on heparin drip  CXR showed possible RLL pneumonia; started on ceftriaxone/azithromycin.  12/9, patient was intubated for encephalopathy and hypoxemic/hypercapnic respiratory failure. 12/10, extubated  12/11, transferred out of ICU  12/12, pulmonary reconsulted for evaluation of worsening right hemithorax with near complete opacification.  12/13, started on BiPAP for hypercapnia.   Subjective: Patient was seen and examined this morning. This morning when he was taken off BiPAP, he was short of breath and decided down to 60s.  He was put back on BiPAP.  Blood gas done immediately showed a PCO2 of 90, pH 7.3 and bicarbonate of 46. He was put back on BiPAP, settings adjusted. At the time of my evaluation, patient was on BiPAP.  Mental status was improving per wife at bedside.  Assessment/Plan: Acute hypoxic/hypercarbic respiratory failure -Likely secondary to pneumonia.  Also contributed by PE and lung mets.   -Was on mechanical ventilation from 12/9-12/10 and was successfully extubated. However on 12/13, patient  was started on BiPAP again for worsening hypoxia.  He has been intermittently on BiPAP since then. -Over the course of last few days, it is noted that his serum bicarb level has been rising up.  Blood gas this morning showed PCO2 level and build up to 90. -He was put back on BiPAP again this morning.  I would continue BiPAP for next 24 hours with intermittent break. -Chest x-ray this morning reports worsening pleural fluid, volume loss and infiltrate in the right lung and persistent left lower lobe pneumonia.  Lasix 1 dose 40 mg IV was given.  Obtain echocardiogram.  -Patient probably is aspirating frequently.  Aspiration pneumonia Pseudomonas in tracheal aspirate -Chest x-ray this morning 12/19 read showing worsening right-sided infiltrates.  Patient probably is chronically aspirating.  Speech therapy evaluation few days ago cleared him for regular diet and thin liquid.  He was able to tolerate that but I think when his mental status get worse, he tends to aspirate more. -He was supposed to complete IV Zosyn 12/20.  I would continue it for another 5 more days.  Segmental right lower lobe pulmonary embolism Previous history of DVT and PE Supratherapeutic INR -Was on warfarin prior to admission.  He presented with INR elevated to 8.8 and at the same time had bilateral PE which probably could be residual PE from previous episode.   -He also had subdural hematoma because of which INR was initially reverted but later per neurosurgery recommendation of anticoagulation was restarted.   -INR was initially supratherapeutic.  Gradually improved.  INR subtherapeutic now..  Coumadin resumed.  Pharmacy following.   Recent Labs  Lab 06/16/21  [id number] 06/17/21 0240 06/18/21 0211 06/19/21 0711 06/20/21 0711  INR 5.7* 3.2* 2.1* 1.5* 1.5*   Acute metabolic encephalopathy -In the setting of hypoxemia, subdural hematoma, prolonged hospitalization -Mental status stable in last 24 hours.  Haldol as needed.    Subdural hematoma -Patient was noted on CT head on admission.  Small in size.  No intervention per neurosurgery.  He has been cleared to take anticoagulation.   -He is also on Vimpat for presumed seizure.  EEG 12/14 did not show any evidence of definite epileptiform discharge.  History of CAD/MI -PTA on aspirin, statin  Type 2 diabetes mellitus -Was on metformin prior to presentation -Currently on sliding scale insulin with Accu-Cheks. -Blood sugar level improved after he was initiated on diet.  Plan is to resume metformin at discharge Recent Labs  Lab 06/19/21 0634 06/19/21 1217 06/19/21 1727 06/19/21 2133 06/20/21 0934  GLUCAP 107* 144* 176* 119* 99   Essential hypertension -PTA on torsemide and metolazone.  Both of them are currently on hold.  Blood pressure remains on lower side of normal.  Continue to monitor.Marland Kitchen   History of metastatic prostate cancer with mets to lungs -Gets treatment at [facility] under the care of Dr. Baird Cancer.  Has been getting radiation treatments.  Can follow-up with his oncologist at discharge.    Dilated small bowel loops -Noted incidentally on CT scan.  Abdomen is benign.  Continue bowel regimen.  Continue to monitor.   Indeterminate right renal lesion -Will need outpatient MRI for same.   Severe protein calorie malnutrition -Dietitian consulted.  Supplements ordered  Mobility: Encourage ambulation Living condition: Lives at home Goals of care:   Code Status: Full Code  Nutritional status: Body mass index is 33.31 kg/m.  Nutrition Problem: Severe Malnutrition Etiology: chronic illness, cancer and cancer related treatments Signs/Symptoms: severe muscle depletion, severe fat depletion Diet:  Diet Order             Diet regular Room service appropriate? Yes with Assist; Fluid consistency: Nectar Thick  Diet effective now                  DVT prophylaxis: Coumadin    Antimicrobials: IV Zosyn, extended till 12/25. Fluid:  None Consultants: Neurology, pulmonology Family Communication: Wife at bedside  Status is: Inpatient  Continue in-hospital care because: Not stable today for discharge because of acute worsening of respiratory status. Level of care: Progressive   Dispo: The patient is from: Home              Anticipated d/c is to: CIR              Patient currently is not medically stable to d/c.   Difficult to place patient No     Infusions:   sodium chloride 10 mL/hr at 06/16/21 1135   sodium chloride 75 mL/hr at 06/19/21 2011   piperacillin-tazobactam (ZOSYN)  IV 3.375 g (06/20/21 0640)    Scheduled Meds:  aspirin EC  81 mg Oral Daily   bisacodyl  10 mg Rectal Q0600   Chlorhexidine Gluconate Cloth  6 each Topical Q0600   docusate sodium  100 mg Oral BID   insulin aspart  0-5 Units Subcutaneous QHS   insulin aspart  0-9 Units Subcutaneous TID WC   lacosamide  50 mg Oral BID   multivitamin with minerals  1 tablet Oral Daily   pantoprazole  40 mg Oral Daily   polyethylene glycol  17 g Oral Daily   Warfarin -  Pharmacist Dosing Inpatient   Does not apply q1600    PRN meds: sodium chloride, acetaminophen, albuterol, haloperidol lactate   Antimicrobials: Anti-infectives (From admission, onward)    Start     Dose/Rate Route Frequency Ordered Stop   06/14/21 2200  piperacillin-tazobactam (ZOSYN) IVPB 3.375 g        3.375 g 12.5 mL/hr over 240 Minutes Intravenous Every 8 hours 06/14/21 1212 06/26/21 2159   06/14/21 1300  piperacillin-tazobactam (ZOSYN) IVPB 4.5 g        4.5 g 200 mL/hr over 30 Minutes Intravenous  Once 06/14/21 1212 06/14/21 1600   06/12/21 1200  ceFEPIme (MAXIPIME) 2 g in sodium chloride 0.9 % 100 mL IVPB  Status:  Discontinued        2 g 200 mL/hr over 30 Minutes Intravenous Every 8 hours 06/12/21 1113 06/14/21 1144   06/11/21 1200  cefTRIAXone (ROCEPHIN) 2 g in sodium chloride 0.9 % 100 mL IVPB  Status:  Discontinued        2 g 200 mL/hr over 30 Minutes Intravenous  Every 24 hours 06/11/21 1025 06/12/21 1113   06/10/21 1600  cefTRIAXone (ROCEPHIN) 1 g in sodium chloride 0.9 % 100 mL IVPB  Status:  Discontinued        1 g 200 mL/hr over 30 Minutes Intravenous Every 24 hours 06/10/21 1025 06/11/21 1025   06/10/21 1600  azithromycin (ZITHROMAX) 500 mg in sodium chloride 0.9 % 250 mL IVPB  Status:  Discontinued        500 mg 250 mL/hr over 60 Minutes Intravenous Every 24 hours 06/10/21 1025 06/12/21 1113   06/15/2021 1615  cefTRIAXone (ROCEPHIN) 1 g in sodium chloride 0.9 % 100 mL IVPB        1 g 200 mL/hr over 30 Minutes Intravenous  Once 06/27/2021 1605 06/04/2021 1716   06/13/2021 1615  azithromycin (ZITHROMAX) 500 mg in sodium chloride 0.9 % 250 mL IVPB        500 mg 250 mL/hr over 60 Minutes Intravenous  Once 06/26/2021 1605 06/14/2021 1750       Objective: Vitals:   06/20/21 0819 06/20/21 1101  BP: 103/68 103/64  Pulse: 84 84  Resp: 16 20  Temp: 97.9 F (36.6 C)   SpO2: 97% 96%    Intake/Output Summary (Last 24 hours) at 06/20/2021 1143 Last data filed at 06/19/2021 2300 Gross per 24 hour  Intake --  Output 1262 ml  Net -1262 ml   Filed Weights   06/17/21 0406 06/18/21 0500 06/20/21 0500  Weight: 115 kg 120.2 kg 120.9 kg   Weight change:  Body mass index is 33.31 kg/m.   Physical Exam: General exam: Elderly Caucasian male.  Not in physical distress.  On BiPAP this morning Skin: No rashes, lesions or ulcers. HEENT: Atraumatic, normocephalic, no obvious bleeding Lungs: Clear to auscultation bilaterally CVS: Regular rate and rhythm, no murmur GI/Abd soft, nontender, nondistended, bowel sound present CNS: Alert, awake, able to answer questions on BiPAP. Psychiatry: Mood appropriate Extremities: No pedal edema, no calf tenderness  Data Review: I have personally reviewed the laboratory data and studies available.  F/u labs ordered Unresulted Labs (From admission, onward)     Start     Ordered   06/21/21 0500  Procalcitonin  Daily,   R      Question:  Specimen collection method  Answer:  Lab=Lab collect   06/20/21 0801   06/21/21 0500  CBC with Differential/Platelet  Daily,   R  Question:  Specimen collection method  Answer:  Lab=Lab collect   06/20/21 0801   06/21/21 9485  Basic metabolic panel  Daily,   R     Question:  Specimen collection method  Answer:  Lab=Lab collect   06/20/21 0801   06/13/21 0500  Protime-INR  Daily,   R     Question:  Specimen collection method  Answer:  Lab=Lab collect   06/12/21 1232            Signed, Terrilee Croak, MD [facility] 06/20/2021

## 2021-06-20 NOTE — Plan of Care (Signed)

## 2021-06-20 NOTE — Progress Notes (Signed)
Rt called d/t pt being placed on 6lpm Delta.and using flutter valve.  Pt dropped sats to 50s.  Rn called rt and rt arrived and helped to place pt back on bipap at 100% and sats increased to 100% . FiO2 reduced to 40% after pt was back to 100%  Pt is barely responsive to sternal rub, grunts with stiff sternal rub.  RT will put in order for ABG at this time.  Rapid response notified.

## 2021-06-20 NOTE — Progress Notes (Signed)
SLP Cancellation Note  Patient Details Name: Adrian Foster MRN: [id number] DOB: 06-02-1934   Cancelled treatment:       Reason Eval/Treat Not Completed: Medical issues which prohibited therapy. Pt currently requiring BiPAP. Checked in with pt/wife and explained that we will have to hold swallowing therapy while he is needing BiPAP, but that we will continue to follow, resuming services as able.     Osie Bond., M.A. [place] [facility] Pager [phone] Office [phone]  06/20/2021, 9:23 AM

## 2021-06-20 NOTE — Progress Notes (Signed)
PT Cancellation Note  Patient Details Name: Romulo Okray MRN: [id number] DOB: 1933/07/23   Cancelled Treatment:    Reason Eval/Treat Not Completed: (P) Medical issues which prohibited therapy (Pt on Bipap with recent desaturation, per MD hold PT until he is off bipap, plan to check with medical team later in afternoon to confirm when pt off bipap.) Will continue efforts later in day per PT plan of care as schedule permits.   Kara Pacer Bayan Kushnir 06/20/2021, 9:56 AM

## 2021-06-20 NOTE — Progress Notes (Signed)
2D echo attempted, but patient refused exam. Notified RN and she stated she would contact the physician.

## 2021-06-20 NOTE — Progress Notes (Signed)
Physical Therapy Treatment Patient Details Name: Adrian Foster MRN: [id number] DOB: 04-Oct-1933 Today's Date: 06/20/2021   History of Present Illness Patient is a 87 yo M [facility] on 12/8 after mechanical fall.  CT head showed 5 mm parafalcine subdural hematoma. Found to be hypoxic with transient episodes of responsiveness once tx to ICU. CXR- RLL PNA, Chest CT- PEs. Intubated 12/9-12/10.  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    PT Comments    Pt received in supine, agreeable to therapy session and with good participation and tolerance for bed mobility, LE exercises and transfer training. Pt able to perform bed mobility with mod cues and minA, nearly upright sit>stand from elevated bed with +59modA (pt crouching slightly in stance) and seated scooting along EOB with min/modA. Pt cleared secretions frequently with suction while seated and after standing trial and SpO2 remains WFL on 5L HF [place] per sensor on his right earlobe with good waveform. Pt noted to have poor waveform on fingers of right hand during mobility tasks so kept pulse oximeter sensor on Rt earlobe. Pt with bed up in chair position preparing to eat and encouraged spouse to keep HOB elevated at least 30 mins after eating for aspiration prevention, spouse receptive to instruction. Pt continues to benefit from PT services to progress toward functional mobility goals.     Recommendations for follow up therapy are one component of a multi-disciplinary discharge planning process, led by the attending physician.  Recommendations may be updated based on patient status, additional functional criteria and insurance authorization.  Follow Up Recommendations  Acute inpatient rehab (3hours/day)     Assistance Recommended at Discharge Frequent or constant Supervision/Assistance  Equipment Recommendations  Other (comment) (TBA pending progress)    Recommendations for  Other Services       Precautions / Restrictions Precautions Precautions: Fall Precaution Comments: keep SBP <160, large hematoma on sacrum Restrictions Weight Bearing Restrictions: No     Mobility  Bed Mobility Overal bed mobility: Needs Assistance Bed Mobility: Rolling;Sidelying to Sit;Sit to Sidelying Rolling: Min assist Sidelying to sit: Min assist;HOB elevated     Sit to sidelying: Min assist;+2 for physical assistance;+2 for safety/equipment General bed mobility comments: Pt initiating well, increaesd time to bring BLE's off edge of bed, assist for trunk to upright but pt able to push through elbow, HOB elevated >50* prior to attempt; minA with trunk/BLE assist for sit>sidelying, pt able to assist with lowering trunk/lifting legs; rolling x2 to L/R with minA for peri-care    Transfers Overall transfer level: Needs assistance Equipment used: Rolling walker (2 wheels) Transfers: Sit to/from Stand;Bed to chair/wheelchair/BSC Sit to Stand: +2 physical assistance;Mod assist;From elevated surface          Lateral/Scoot Transfers: Min assist General transfer comment: Heavy modA + 2 from elevated bed>RW, pt in crouched posture and unable to fully extend trunk/neck, too fatigued to re-attempt after sitting down; minA with mod cues for lateral scooting ~12" along EOB as pre-transfer training.    Ambulation/Gait               General Gait Details: pt unable   Stairs             Wheelchair Mobility    Modified Rankin (Stroke Patients Only) Modified Rankin (Stroke Patients Only) Pre-Morbid Rankin Score: Moderate disability Modified Rankin: Severe disability     Balance Overall balance assessment: Needs assistance Sitting-balance support: Feet supported;Bilateral  upper extremity supported Sitting balance-Leahy Scale:  (Fair to Poor) Sitting balance - Comments: close Supervision to min guard for static sitting, min guard to minA for dynamic sitting    Standing balance support: Bilateral upper extremity supported;Reliant on assistive device for balance Standing balance-Leahy Scale: Poor Standing balance comment: crouched posture static standing at RW, unable to weight shift                            Cognition Arousal/Alertness: Awake/alert Behavior During Therapy: WFL for tasks assessed/performed Overall Cognitive Status: Impaired/Different from baseline Area of Impairment: Memory;Following commands;Safety/judgement;Problem solving;Attention;Awareness                   Current Attention Level: Sustained Memory: Decreased short-term memory Following Commands: Follows one step commands with increased time Safety/Judgement: Decreased awareness of deficits;Decreased awareness of safety Awareness: Intellectual Problem Solving: Slow processing;Difficulty sequencing;Requires verbal cues;Requires tactile cues;Decreased initiation General Comments: Pleasant and willing to participate, needs multimodal cues for bed mobility for improved technique        Exercises Other Exercises Other Exercises: supine BLE AROM: ankle pumps, hip abduction/adduction, heel slides x5-10 reps ea Other Exercises: IS x10 reps (~300-500 mL)    General Comments General comments (skin integrity, edema, etc.): SpO2/BP WFL on 5L HF [place] (sensor with improved signal when placed on R earlobe than on fingers)BP 105/61 supine and BP 113/61 seated EOB; HR 91-98 bpm seated and up to 115 bpm with standing trial      Pertinent Vitals/Pain Pain Assessment: Faces Faces Pain Scale: Hurts a little bit Pain Location: coughing, sacrum from hematoma Pain Descriptors / Indicators: Grimacing Pain Intervention(s): Limited activity within patient's tolerance;Monitored during session;Repositioned    Home Living                          Prior Function            PT Goals (current goals can now be found in the care plan section) Acute Rehab PT  Goals Patient Stated Goal: Per wife, get back to independence PT Goal Formulation: With patient Time For Goal Achievement: 06/26/21 Progress towards PT goals: Progressing toward goals    Frequency    Min 4X/week      PT Plan Current plan remains appropriate    Co-evaluation              AM-PAC PT "6 Clicks" Mobility   Outcome Measure  Help needed turning from your back to your side while in a flat bed without using bedrails?: A Little Help needed moving from lying on your back to sitting on the side of a flat bed without using bedrails?: A Lot Help needed moving to and from a bed to a chair (including a wheelchair)?: A Lot Help needed standing up from a chair using your arms (e.g., wheelchair or bedside chair)?: A Lot Help needed to walk in hospital room?: Total Help needed climbing 3-5 steps with a railing? : Total 6 Click Score: 11    End of Session Equipment Utilized During Treatment: Oxygen;Gait belt Activity Tolerance: Patient tolerated treatment well Patient left: with call bell/phone within reach;with family/visitor present;in bed;with bed alarm set (bed in chair posture, heels floated, food tray in front of him and spouse preparing to help him eat) Nurse Communication: Need for lift equipment;Mobility status PT Visit Diagnosis: Hemiplegia and hemiparesis;Unsteadiness on feet (R26.81);Difficulty in walking, not elsewhere classified (R26.2) Hemiplegia -  Right/Left: Right Hemiplegia - dominant/non-dominant: Dominant Hemiplegia - caused by: Cerebral infarction     Time: 3143-8887 PT Time Calculation (min) (ACUTE ONLY): 40 min  Charges:  $Therapeutic Exercise: 8-22 mins $Therapeutic Activity: 23-37 mins                     Emalia Witkop P., PTA Acute Rehabilitation Services Pager: [phone] Office: [place] 06/20/2021, 5:21 PM

## 2021-06-20 NOTE — TOC Benefit Eligibility Note (Signed)
Patient Teacher, English as a foreign language completed.    The patient is currently admitted and upon discharge could be taking Eliquis 5 mg.  The current 30 day co-pay is, $47.00.   The patient is insured through [place], [place] Patient Advocate Specialist [place] Patient Advocate Team Direct Number: [phone]  Fax: [phone]

## 2021-06-20 NOTE — Progress Notes (Signed)
NAME:  Adrian Foster, MRN:  [id number], DOB:  Jun 06, 1934, LOS: 65 ADMISSION DATE:  06/15/2021, CONSULTATION DATE:  06/13/2021 REFERRING MD:  Dr. Maryland Pink, CHIEF COMPLAINT:  Pleural Effusion   History of Present Illness:  87 year old male presents to [facility] on 12/8 after mechanical fall the night prior on coumadin oncologist recommended he come to ED.   On arrival to [facility] ED on 12/8, patient was found to be hypoxic in 70s on room air. Sats improved with supplemental O2. Patient has been taking Coumadin for subsegmental PE outpatient. INR was 8.8. CT chest shows PE within segmental and subsegmental branches on RLL. CT head showed 5 mm parafalcine subdural hematoma. Neurosurgery consulted and recommend no surgery and recommend not severe enough to reverse anticoagulation. Vitamin K given to improve INR to allow [place] to start patient on heparin drip. CXR shows possible RLL pneumonia; given ceftriaxone/azithromycin. 12/9 intubated for encephalopathy and hypoxemic/hypercapnic respiratory failure. Extubated 12/10.  Patient transferred out of ICU 12/11. 12/12 pulmonary consulted for evaluation of worsening right hemithorax with near complete opacification.   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 Baird Cancer at [facility] > treated with radiation in 01/2021  Significant Hospital Events: Including procedures, antibiotic start and stop dates in addition to other pertinent events   12/8: admitted to [facility] on 12/8 for hypoxia 12/9 early morning intubated for encephalopathy and hypoxemic/hypercapnic respiratory failure 12/10 extubated  12/13 started on bipap for hypercapnia  Interim History / Subjective:   Worsening hypoxemia Worsening CXR findings Was really sleepy yesterday, wearing NIMV more  Objective   Blood pressure 106/64, pulse 87, temperature 99 F (37.2 C),  temperature source Oral, resp. rate (!) 24, height 6\' 3"  (1.905 m), weight 120.9 kg, SpO2 98 %.    FiO2 (%):  [30 %-45 %] 30 %   Intake/Output Summary (Last 24 hours) at 06/20/2021 1208 Last data filed at 06/20/2021 1146 Gross per 24 hour  Intake --  Output 2112 ml  Net -2112 ml   Filed Weights   06/17/21 0406 06/18/21 0500 06/20/21 0500  Weight: 115 kg 120.2 kg 120.9 kg    Examination:  General:  Chronically ill appearing, resting comfortably in bed HENT: NCAT OP clear PULM: Diminished breath sounds on R, clear on left, normal effort CV: RRR, no mgr GI: BS+, soft, nontender MSK: diminished bulk and tone Neuro: awake, alert, [facility] Problem list     Assessment & Plan:   Acute Hypoxic/Hypercarbic Respiratory Failure in setting pseudomonas pneumonia and aspiration pneumonia, worsening oxygenation 12/18-12/19 with worsening CXR findings after nearly all pulmonary toilette measures were held Segmental/Subsegmental RLL PE Stage IIb squamous cell lung cancer RUL treated with XRT 01/2021 Bronchiectasis and RLL aspiration at baseline  Plan Zosyn for 14 days total Adding back hypertonic saline again with scheduled duoneb Re-ordering chest PT bid NTS prn Aspiration precautions BIPAP qHS to continue, prn Repeat CXR in AM   Subdural Hematoma  Concern for Seizure > transient episode of decreased responsiveness and right upper extremity jerking  Altered Mental Status  Plan Per neurology  Overall prognosis: I'm concerned that he has not made much improvement.  Consider palliative medicine input.  PCCM will continue to follow  Best Practice (right click and "Reselect all SmartList Selections" daily)   Remaining management per primary team.   Labs  CBC: Recent Labs  Lab 06/16/21 0338 06/17/21 0240 06/18/21 0211 06/19/21 0711 06/20/21 0711  WBC 4.7 5.5 6.0 6.8 5.3   5.1  NEUTROABS  --   --   --   --  4.6  HGB 9.0* 9.2* 8.9* 9.1* 8.8*   8.5*   HCT 30.9* 32.2* 31.5* 32.7* 31.1*   31.0*  MCV 100.0 100.3* 102.6* 103.5* 105.4*   105.8*  PLT 205 213 221 208 214   193    Basic Metabolic Panel: Recent Labs  Lab 06/16/21 0338 06/17/21 0240 06/18/21 0211 06/19/21 0711 06/20/21 0711  NA 140 142 143 144 145  K 3.7 4.0 3.9 3.9 3.9  CL 95* 97* 98 98 97*  CO2 41* 39* 40* 42* >45*  GLUCOSE 84 118* 130* 107* 106*  BUN 13 19 21 21  24*  CREATININE 0.73 0.73 0.66 0.61 0.61  CALCIUM 8.8* 9.0 8.9 9.2 9.0   GFR: Estimated Creatinine Clearance: 94.6 mL/min (by C-G formula based on SCr of 0.61 mg/dL). Recent Labs  Lab 06/17/21 0240 06/18/21 0211 06/19/21 0711 06/20/21 0711  PROCALCITON  --   --   --  21.03  WBC 5.5 6.0 6.8 5.3   5.1    Liver Function Tests: Recent Labs  Lab 06/16/21 0338  AST 26  ALT 16  ALKPHOS 62  BILITOT 0.9  PROT 5.5*  ALBUMIN 1.5*   No results for input(s): LIPASE, AMYLASE in the last 168 hours. No results for input(s): AMMONIA in the last 168 hours.  ABG    Component Value Date/Time   PHART 7.330 (L) 06/20/2021 0710   PCO2ART 89.9 (HH) 06/20/2021 0710   PO2ART 69.7 (L) 06/20/2021 0710   HCO3 46.2 (H) 06/20/2021 0710   TCO2 43 (H) 06/10/2021 0905   O2SAT 94.6 06/20/2021 0710     Coagulation Profile: Recent Labs  Lab 06/16/21 0338 06/17/21 0240 06/18/21 0211 06/19/21 0711 06/20/21 0711  INR 5.7* 3.2* 2.1* 1.5* 1.5*    Cardiac Enzymes: No results for input(s): CKTOTAL, CKMB, CKMBINDEX, TROPONINI in the last 168 hours.  HbA1C: Hgb A1c MFr Bld  Date/Time Value Ref Range Status  06/17/2021 02:40 AM 6.4 (H) 4.8 - 5.6 % Final    Comment:    (NOTE) Pre diabetes:          5.7%-6.4%  Diabetes:              >6.4%  Glycemic control for   <7.0% adults with diabetes     CBG: Recent Labs  Lab 06/19/21 0634 06/19/21 1217 06/19/21 1727 06/19/21 2133 06/20/21 0934  GLUCAP 107* 144* 176* 119* 99   > 35 minutes spent on this visit in direct discussion with the patient, his wife,  and bedside nursing team and reviewing his records and making the plan of care  Roselie Awkward, MD [place] [facility] Pager: [phone] Cell: [phone] After 7:00 pm call Elink  [phone]

## 2021-06-21 ENCOUNTER — Inpatient Hospital Stay (HOSPITAL_COMMUNITY): Payer: Medicare Other

## 2021-06-21 LAB — CBC WITH DIFFERENTIAL/PLATELET
Abs Immature Granulocytes: 0.03 10*3/uL (ref 0.00–0.07)
Basophils Absolute: 0 10*3/uL (ref 0.0–0.1)
Basophils Relative: 0 %
Eosinophils Absolute: 0.1 10*3/uL (ref 0.0–0.5)
Eosinophils Relative: 2 %
HCT: 29.4 % — ABNORMAL LOW (ref 39.0–52.0)
Hemoglobin: 8.4 g/dL — ABNORMAL LOW (ref 13.0–17.0)
Immature Granulocytes: 1 %
Lymphocytes Relative: 6 %
Lymphs Abs: 0.3 10*3/uL — ABNORMAL LOW (ref 0.7–4.0)
MCH: 29.5 pg (ref 26.0–34.0)
MCHC: 28.6 g/dL — ABNORMAL LOW (ref 30.0–36.0)
MCV: 103.2 fL — ABNORMAL HIGH (ref 80.0–100.0)
Monocytes Absolute: 0.3 10*3/uL (ref 0.1–1.0)
Monocytes Relative: 5 %
Neutro Abs: 4.9 10*3/uL (ref 1.7–7.7)
Neutrophils Relative %: 86 %
Platelets: 199 10*3/uL (ref 150–400)
RBC: 2.85 MIL/uL — ABNORMAL LOW (ref 4.22–5.81)
RDW: 15.9 % — ABNORMAL HIGH (ref 11.5–15.5)
WBC: 5.6 10*3/uL (ref 4.0–10.5)
nRBC: 0 % (ref 0.0–0.2)

## 2021-06-21 LAB — BLOOD GAS, ARTERIAL
Acid-Base Excess: 20.1 mmol/L — ABNORMAL HIGH (ref 0.0–2.0)
Bicarbonate: 46.2 mmol/L — ABNORMAL HIGH (ref 20.0–28.0)
Drawn by: 358491
FIO2: 36
O2 Saturation: 96.6 %
Patient temperature: 36.7
pCO2 arterial: 73.9 mmHg (ref 32.0–48.0)
pH, Arterial: 7.41 (ref 7.350–7.450)
pO2, Arterial: 76.8 mmHg — ABNORMAL LOW (ref 83.0–108.0)

## 2021-06-21 LAB — BASIC METABOLIC PANEL
BUN: 22 mg/dL (ref 8–23)
CO2: >45 mmol/L — ABNORMAL HIGH (ref 22–32)
Calcium: 8.9 mg/dL (ref 8.9–10.3)
Chloride: 94 mmol/L — ABNORMAL LOW (ref 98–111)
Creatinine, Ser: 0.58 mg/dL — ABNORMAL LOW (ref 0.61–1.24)
GFR, Estimated: >60 mL/min (ref >60)
Glucose, Bld: 130 mg/dL — ABNORMAL HIGH (ref 70–99)
Potassium: 3.3 mmol/L — ABNORMAL LOW (ref 3.5–5.1)
Sodium: 144 mmol/L (ref 135–145)

## 2021-06-21 LAB — PROTIME-INR
INR: 1.5 — ABNORMAL HIGH (ref 0.8–1.2)
Prothrombin Time: 18.1 seconds — ABNORMAL HIGH (ref 11.4–15.2)

## 2021-06-21 LAB — GLUCOSE, CAPILLARY
Glucose-Capillary: 106 mg/dL — ABNORMAL HIGH (ref 70–99)
Glucose-Capillary: 170 mg/dL — ABNORMAL HIGH (ref 70–99)
Glucose-Capillary: 114 mg/dL — ABNORMAL HIGH (ref 70–99)
Glucose-Capillary: 156 mg/dL — ABNORMAL HIGH (ref 70–99)

## 2021-06-21 LAB — PROCALCITONIN: Procalcitonin: <0.1 ng/mL

## 2021-06-21 IMAGING — DX DG CHEST 1V PORT
1 series · 2 of 2 positions shown · non-contrast
Comparison: [DATE] at [DATE] a.m.

CLINICAL DATA: Dyspnea

EXAM:
PORTABLE CHEST 1 VIEW

[Series 1: chest · 0.14mm/px · 2 of 2 slices shown]
[im 1/2]
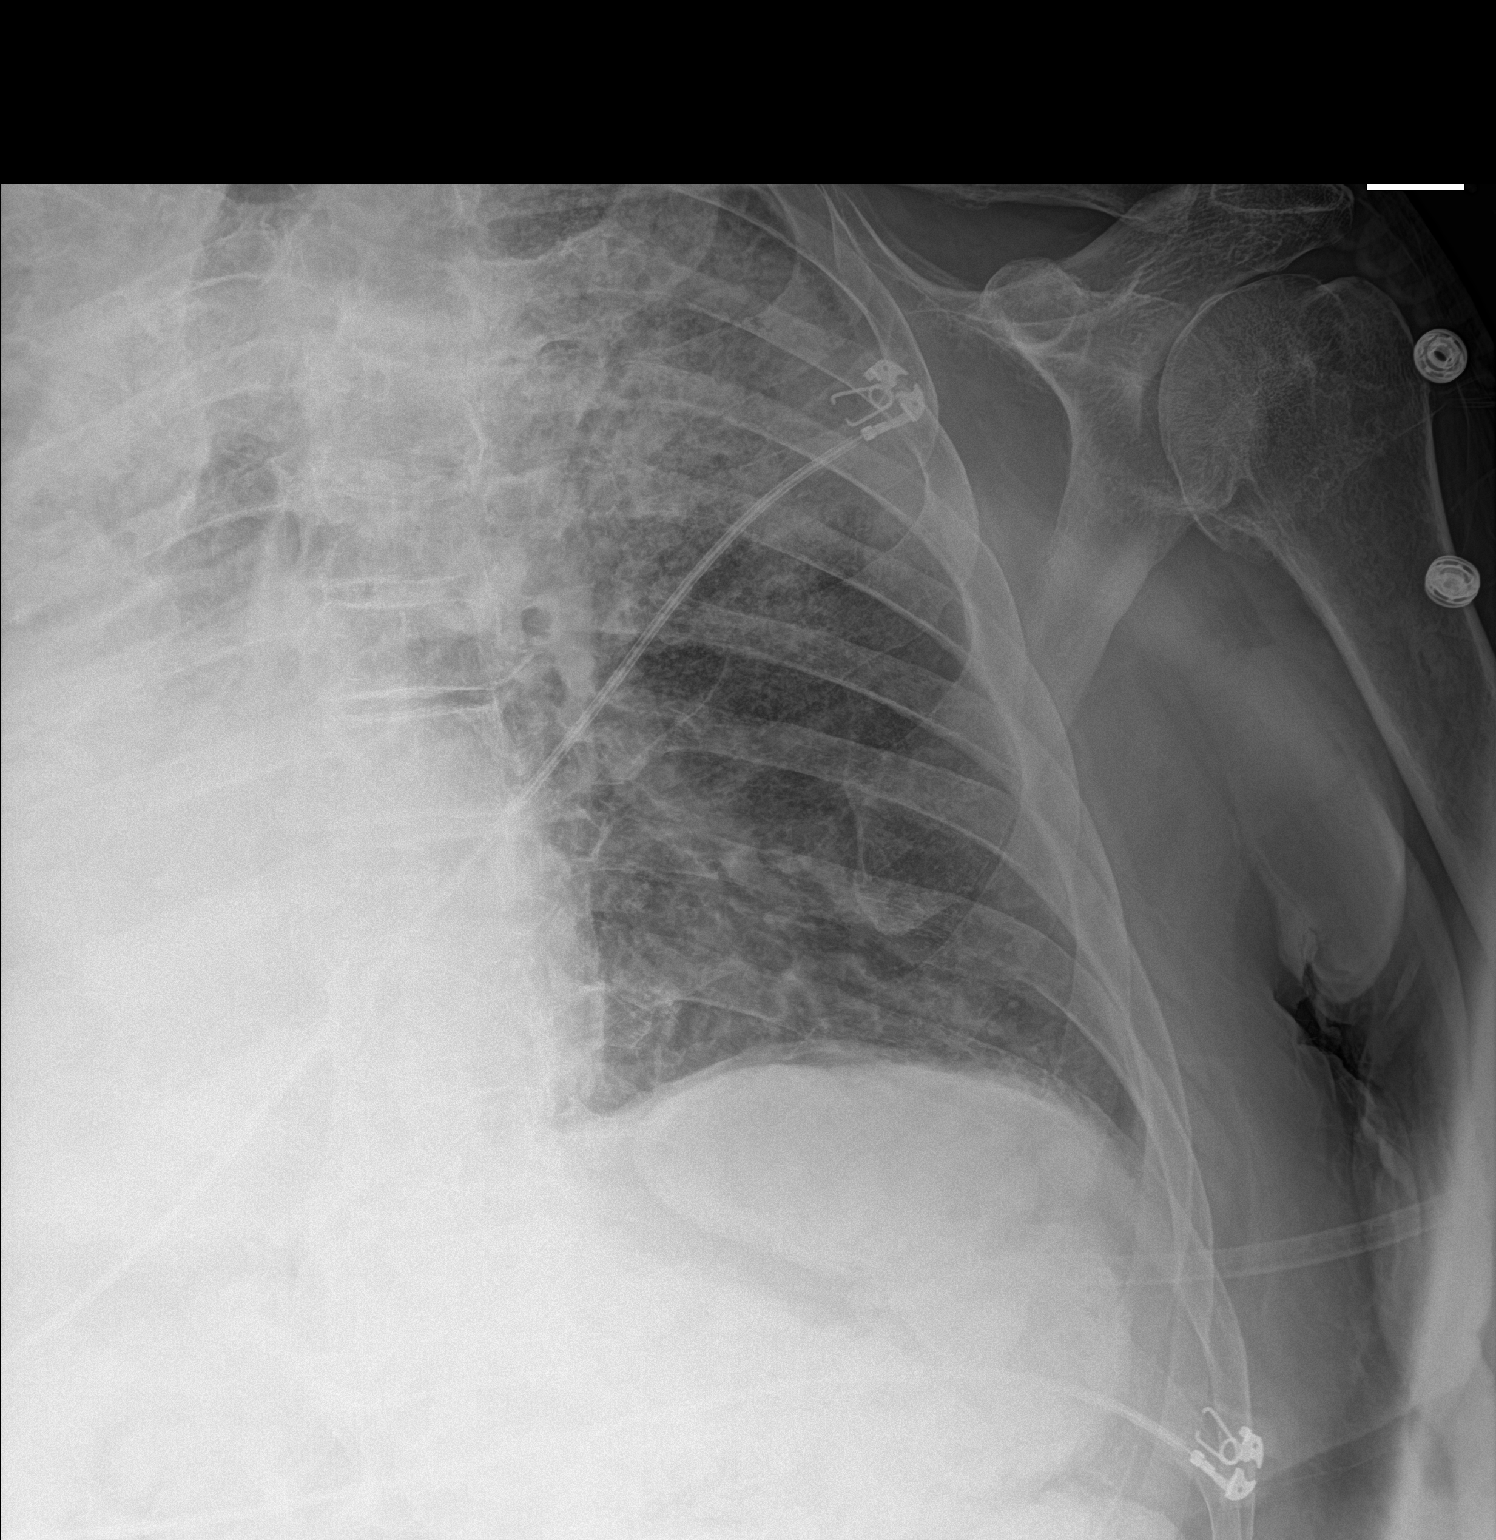
[im 2/2]
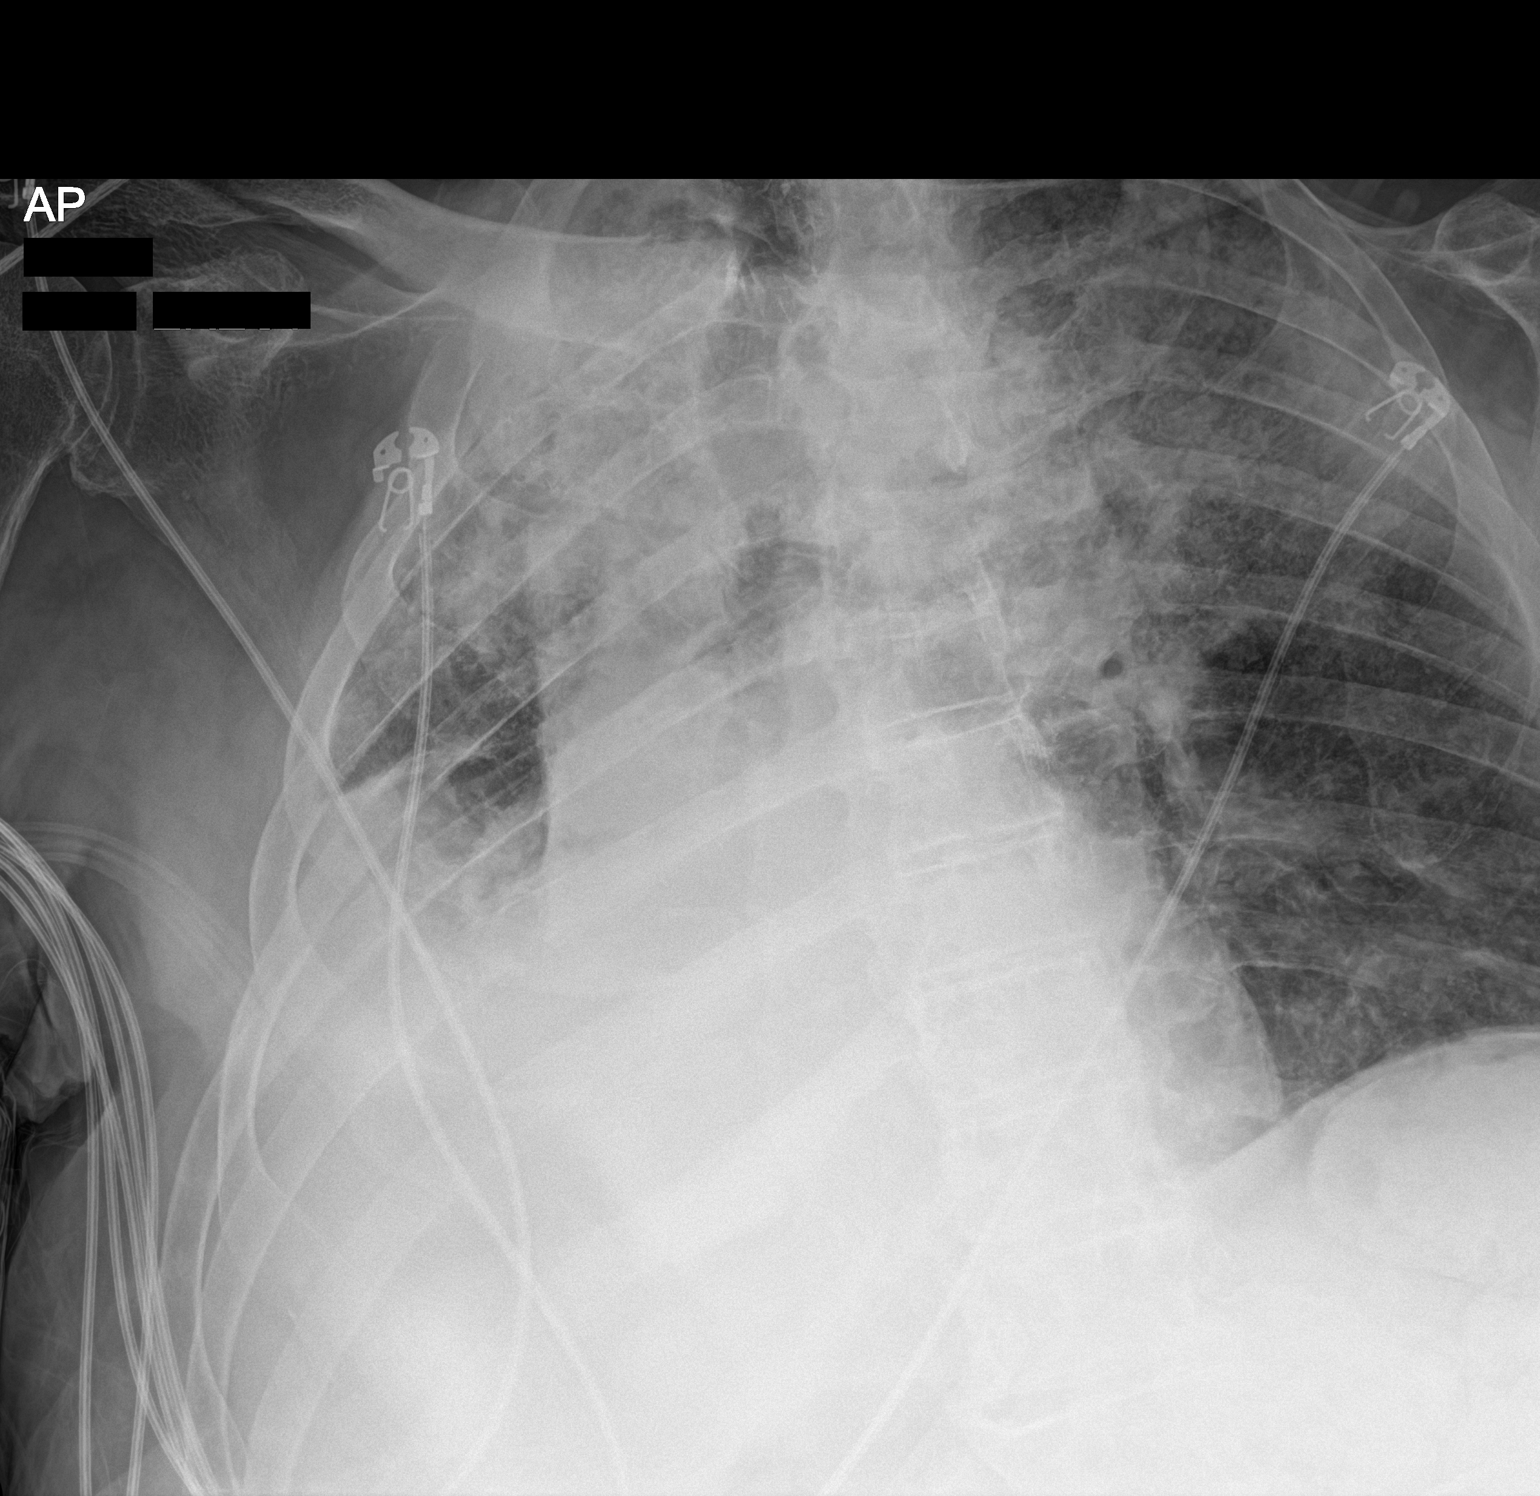

[2 of 2 positions shown; findings below may reference images not displayed]

FINDINGS: Moderate right pleural effusion is stable. Superimposed right
basilar collapse with right-sided volume loss and mediastinal shift
to the right is unchanged. Bilateral upper lung zone airspace
infiltrate persists, stable since prior examination, likely
infectious in etiology. No pneumothorax. Cardiac size within normal
limits.
IMPRESSION: Stable examination.

Stable moderate right pleural effusion. Superimposed right basilar
collapse and resultant right-sided volume loss.

Stable upper lung zone pulmonary infiltrates, likely infectious.

## 2021-06-21 MED ORDER — WARFARIN SODIUM 4 MG PO TABS
4.0000 mg | ORAL_TABLET | Freq: Once | ORAL | Status: AC
  Administered 2021-06-21: 17:00:00 4 mg via ORAL
  Filled 2021-06-21: qty 1

## 2021-06-21 MED ORDER — GUAIFENESIN ER 600 MG PO TB12
1200.0000 mg | ORAL_TABLET | Freq: Two times a day (BID) | ORAL | Status: DC
  Administered 2021-06-21 – 2021-06-23 (×5): 1200 mg via ORAL
  Filled 2021-06-21 (×6): qty 2

## 2021-06-21 MED ORDER — ENOXAPARIN SODIUM 120 MG/0.8ML IJ SOSY
120.0000 mg | PREFILLED_SYRINGE | Freq: Two times a day (BID) | INTRAMUSCULAR | Status: DC
  Administered 2021-06-21 – 2021-06-22 (×2): 120 mg via SUBCUTANEOUS
  Filled 2021-06-21 (×2): qty 0.8

## 2021-06-21 NOTE — Progress Notes (Signed)
PROGRESS NOTE  Adrian Foster  DOB: [birth year]  PCP: Charleston Poot, MD TJQ:[id number]  DOA: 07/02/2021  LOS: 12 days  Hospital Day: 34  Chief Complaint  Patient presents with   Fall    Brief narrative: Adrian Foster is a 87 y.o. male with PMH significant for DM2, HTN, CAD/MI, DVT/PE on Coumadin, metastatic prostate cancer with mets to lungs on active radiation. Patient was brought to the ED on 12/8 after a mechanical fall while on Coumadin..  In the ED, patient was found to be hypoxic in 70s on room air and required supplemental oxygen. INR was elevated to 8.8 CT chest showed PE within segmental and subsegmental branches on RLL.  CT head showed 5 mm parafalcine subdural hematoma.  Admitted to hospital service Neurosurgery was consulted, did not recommend surgical intervention orders reversal of anticoagulation.  Patient was given vitamin K and also subsequently started on heparin drip  CXR showed possible RLL pneumonia; started on ceftriaxone/azithromycin.  12/9, patient was intubated for encephalopathy and hypoxemic/hypercapnic respiratory failure. 12/10, extubated  12/11, transferred out of ICU  12/12, pulmonary reconsulted for evaluation of worsening right hemithorax with near complete opacification.  12/13, started on BiPAP for hypercapnia.   Subjective: Patient was seen and examined this morning. Propped up in bed.  Not in distress.  On 4 L oxygen nasal cannula. Was on BiPAP last night. Blood gas from this morning with PCO2 improvement to 74. Alert, awake, oriented x3.  Wife at bedside.  Assessment/Plan: Acute hypoxic/hypercarbic respiratory failure -Likely secondary to pneumonia.  Also contributed by PE and lung mets.   -Was on mechanical ventilation from 12/9-12/10 and was successfully extubated. However on 12/13, patient was started on BiPAP again for worsening hypoxia.  He has been BiPAP nightly and as needed since then. -Blood gas this morning with PCO2 74 which is  better than 90 yesterday. -Continue nightly BiPAP. -Also getting flutter valve and incentive spirometry. -Chest x-ray repeated on 12/19 showed worsening pleural fluid, volume loss and infiltrate in the right lung and persistent left lower lobe pneumonia.  Patient probably is having frequent aspirations.  Antibiotic course was prolonged till 12/25.  Lasix 1 dose 40 mg IV was also given.    Aspiration pneumonia Pseudomonas in tracheal aspirate -Chest x-ray repeated on 12/19 worsening right-sided infiltrates.  Patient probably is chronically aspirating.  Speech therapy to follow  -Continue IV antibiotics till 12/25.  Segmental right lower lobe pulmonary embolism Previous history of DVT and PE Supratherapeutic INR -Patient has history of DVT PE in the past as well and was chronically on Coumadin. -Patient presented with INR elevated to 8.8 and at the same time had bilateral PE .  -He initially required anticoagulation reversal after which INR trended down.  -Coumadin was subsequently resumed but subtherapeutic INR for last 3 days.  Patient and family not yet ready to switch to Eliquis.  While subtherapeutic, I would bridge with Lovenox full dose. Recent Labs  Lab 06/17/21 0240 06/18/21 0211 06/19/21 0711 06/20/21 0711 06/21/21 0334  INR 3.2* 2.1* 1.5* 1.5* 1.5*   Acute metabolic encephalopathy -In the setting of hypoxemia, subdural hematoma, prolonged hospitalization -Mental status stable in last 24 hours.  Haldol as needed.   Subdural hematoma -Patient was noted on CT head on admission.  Small in size.  No intervention per neurosurgery.  Neurology has cleared him to take anticoagulation.   -Per neurology note and recommendation from 12/12, patient has been on Vimpat.  He will continue it for 2 to 3  months and follow-up with neurology.  Can be considered for weaning off antiseizure medication at that time.   -Continue seizure precautions.    History of CAD/MI -PTA on aspirin,  statin  Type 2 diabetes mellitus -Was on metformin prior to presentation -Currently on sliding scale insulin with Accu-Cheks. -Blood sugar level improved after he was initiated on diet.  Plan is to resume metformin at discharge Recent Labs  Lab 06/20/21 1253 06/20/21 1718 06/20/21 2104 06/21/21 0625 06/21/21 1025  GLUCAP 122* 184* 162* 106* 170*   Essential hypertension -PTA on torsemide and metolazone.  Both of them are currently on hold.  Blood pressure remains on lower side of normal.  Continue to monitor.Marland Kitchen   History of metastatic prostate cancer with mets to lungs -Gets treatment at [facility] under the care of Dr. Baird Cancer.  Had been getting radiation treatments.  Can follow-up with his oncologist at discharge.    Dilated small bowel loops -Noted incidentally on CT scan.  Abdomen is benign.  Continue bowel regimen.  Continue to monitor.   Indeterminate right renal lesion -Will need outpatient MRI for same.   Severe protein calorie malnutrition -Dietitian consulted.  Supplements ordered  Mobility: Encourage ambulation Living condition: Lives at home Goals of care:   Code Status: Full Code  Nutritional status: Body mass index is 33.29 kg/m.  Nutrition Problem: Severe Malnutrition Etiology: chronic illness, cancer and cancer related treatments Signs/Symptoms: severe muscle depletion, severe fat depletion Diet:  Diet Order             Diet regular Room service appropriate? Yes with Assist; Fluid consistency: Nectar Thick  Diet effective now                  DVT prophylaxis: Coumadin    Antimicrobials: IV Zosyn, extended till 12/25. Fluid: None Consultants: Neurology, pulmonology Family Communication: Wife at bedside  Status is: Inpatient  Continue in-hospital care because: Not stable today for discharge because of acute worsening of respiratory status. Level of care: Progressive   Dispo: The patient is from: Home              Anticipated d/c is to: CIR                Patient currently is not medically stable to d/c.   Difficult to place patient No     Infusions:   sodium chloride 10 mL/hr at 06/16/21 1135   sodium chloride 75 mL/hr at 06/21/21 1257   piperacillin-tazobactam (ZOSYN)  IV 3.375 g (06/21/21 0508)    Scheduled Meds:  aspirin EC  81 mg Oral Daily   bisacodyl  10 mg Rectal Q0600   Chlorhexidine Gluconate Cloth  6 each Topical Q0600   docusate sodium  100 mg Oral BID   guaiFENesin  1,200 mg Oral BID   insulin aspart  0-5 Units Subcutaneous QHS   insulin aspart  0-9 Units Subcutaneous TID WC   ipratropium-albuterol  3 mL Nebulization Q6H   lacosamide  50 mg Oral BID   multivitamin with minerals  1 tablet Oral Daily   pantoprazole  40 mg Oral Daily   polyethylene glycol  17 g Oral Daily   sodium chloride HYPERTONIC  4 mL Nebulization BID   Warfarin - Pharmacist Dosing Inpatient   Does not apply q1600    PRN meds: sodium chloride, acetaminophen, albuterol, haloperidol lactate   Antimicrobials: Anti-infectives (From admission, onward)    Start     Dose/Rate Route Frequency Ordered Stop  06/14/21 2200  piperacillin-tazobactam (ZOSYN) IVPB 3.375 g        3.375 g 12.5 mL/hr over 240 Minutes Intravenous Every 8 hours 06/14/21 1212 06/26/21 2159   06/14/21 1300  piperacillin-tazobactam (ZOSYN) IVPB 4.5 g        4.5 g 200 mL/hr over 30 Minutes Intravenous  Once 06/14/21 1212 06/14/21 1600   06/12/21 1200  ceFEPIme (MAXIPIME) 2 g in sodium chloride 0.9 % 100 mL IVPB  Status:  Discontinued        2 g 200 mL/hr over 30 Minutes Intravenous Every 8 hours 06/12/21 1113 06/14/21 1144   06/11/21 1200  cefTRIAXone (ROCEPHIN) 2 g in sodium chloride 0.9 % 100 mL IVPB  Status:  Discontinued        2 g 200 mL/hr over 30 Minutes Intravenous Every 24 hours 06/11/21 1025 06/12/21 1113   06/10/21 1600  cefTRIAXone (ROCEPHIN) 1 g in sodium chloride 0.9 % 100 mL IVPB  Status:  Discontinued        1 g 200 mL/hr over 30 Minutes  Intravenous Every 24 hours 06/10/21 1025 06/11/21 1025   06/10/21 1600  azithromycin (ZITHROMAX) 500 mg in sodium chloride 0.9 % 250 mL IVPB  Status:  Discontinued        500 mg 250 mL/hr over 60 Minutes Intravenous Every 24 hours 06/10/21 1025 06/12/21 1113   06/20/2021 1615  cefTRIAXone (ROCEPHIN) 1 g in sodium chloride 0.9 % 100 mL IVPB        1 g 200 mL/hr over 30 Minutes Intravenous  Once 06/07/2021 1605 06/21/2021 1716   06/12/2021 1615  azithromycin (ZITHROMAX) 500 mg in sodium chloride 0.9 % 250 mL IVPB        500 mg 250 mL/hr over 60 Minutes Intravenous  Once 06/29/2021 1605 06/08/2021 1750       Objective: Vitals:   06/21/21 0707 06/21/21 0748  BP: (!) 111/59   Pulse: 73   Resp: 20   Temp: 98.1 F (36.7 C)   SpO2: 98% 100%    Intake/Output Summary (Last 24 hours) at 06/21/2021 1326 Last data filed at 06/21/2021 0843 Gross per 24 hour  Intake 688.38 ml  Output 600 ml  Net 88.38 ml   Filed Weights   06/18/21 0500 06/20/21 0500 06/21/21 0500  Weight: 120.2 kg 120.9 kg 120.8 kg   Weight change: -0.1 kg Body mass index is 33.29 kg/m.   Physical Exam: General exam: Elderly Caucasian male.  Not in physical distress.  On BiPAP this morning. Skin: No rashes, lesions or ulcers. HEENT: Atraumatic, normocephalic, no obvious bleeding Lungs: Clear to auscultation bilaterally CVS: Regular rate and rhythm, no murmur. GI/Abd soft, nontender, nondistended, bowel sound present CNS: Alert, awake, able to answer questions on BiPAP. Psychiatry: Mood appropriate Extremities: No pedal edema, no calf tenderness  Data Review: I have personally reviewed the laboratory data and studies available.  F/u labs ordered Unresulted Labs (From admission, onward)     Start     Ordered   06/21/21 0500  Procalcitonin  Daily,   R     Question:  Specimen collection method  Answer:  Lab=Lab collect   06/20/21 0801   06/21/21 0500  CBC with Differential/Platelet  Daily,   R     Question:  Specimen  collection method  Answer:  Lab=Lab collect   06/20/21 0801   06/21/21 6629  Basic metabolic panel  Daily,   R     Question:  Specimen collection method  Answer:  Lab=Lab collect   06/20/21 0801   06/13/21 0500  Protime-INR  Daily,   R     Question:  Specimen collection method  Answer:  Lab=Lab collect   06/12/21 1232            Signed, Terrilee Croak, MD [facility] 06/21/2021

## 2021-06-21 NOTE — Progress Notes (Signed)
Nutrition Follow-up  DOCUMENTATION CODES:   Severe malnutrition in context of chronic illness, Obesity unspecified  INTERVENTION:  -Continue Vital Cuisine Shake po TID, each supplement provides 520 kcal and 22 grams of protein -Continue MVI with minerals daily  NUTRITION DIAGNOSIS:   Severe Malnutrition related to chronic illness, cancer and cancer related treatments as evidenced by severe muscle depletion, severe fat depletion.  ongoing  GOAL:   Patient will meet greater than or equal to 90% of their needs  progressing  MONITOR:   Diet advancement, PO intake, Supplement acceptance, Labs, Weight trends, I & O's  REASON FOR ASSESSMENT:   Ventilator    ASSESSMENT:   87 yo male with a PMH of CAD s/p MI, DVT/PE on Coumadin, T2DM, HTN, metastatic prostate cancer w/ mets to lungs on active radiation presents to [facility] on 12/8 after mechanical fall the night prior on coumadin oncologist recommended he come to ED.  12/09 - worsening encephalopathy and hypoxemia requiring intubation 12/10 - extubated  12/13 - started on bipap for hypercapnia  Per CCM, pt with worsening oxygenation 12/18-12/19 w/ worsening CXR findings after nearly all pulmonary toilette measures were held. CXR to be repeated this morning.   Pt sleeping at time of RD visit. Per RN, pt has been lethargic with increased work of breathing.   PO intake: 50-90% x 3 recorded meals (80% avg meal intake)  UOP: 1423ml x24 hours I/O: -1244ml since admit  Admit wt: 121.8 kg Current wt: 120.8 kg  Medications: Scheduled Meds:  aspirin EC  81 mg Oral Daily   bisacodyl  10 mg Rectal Q0600   Chlorhexidine Gluconate Cloth  6 each Topical Q0600   docusate sodium  100 mg Oral BID   insulin aspart  0-5 Units Subcutaneous QHS   insulin aspart  0-9 Units Subcutaneous TID WC   ipratropium-albuterol  3 mL Nebulization Q6H   lacosamide  50 mg Oral BID   multivitamin with minerals  1 tablet Oral Daily   pantoprazole  40 mg  Oral Daily   polyethylene glycol  17 g Oral Daily   sodium chloride HYPERTONIC  4 mL Nebulization BID   Warfarin - Pharmacist Dosing Inpatient   Does not apply q1600  Continuous Infusions:  sodium chloride 10 mL/hr at 06/16/21 1135   sodium chloride 75 mL/hr at 06/19/21 2011   piperacillin-tazobactam (ZOSYN)  IV 3.375 g (06/21/21 0508)    Labs: Recent Labs  Lab 06/19/21 0711 06/20/21 0711 06/21/21 0334  NA 144 145 144  K 3.9 3.9 3.3*  CL 98 97* 94*  CO2 42* >45* >45*  BUN 21 24* 22  CREATININE 0.61 0.61 0.58*  CALCIUM 9.2 9.0 8.9  GLUCOSE 107* 106* 130*  CBGs: 99-184 x24 hours   Diet Order:   Diet Order             Diet regular Room service appropriate? Yes with Assist; Fluid consistency: Nectar Thick  Diet effective now                   EDUCATION NEEDS:   Education needs have been addressed  Skin:  Skin Assessment: Reviewed RN Assessment  Last BM:  12/18  Height:   Ht Readings from Last 1 Encounters:  06/05/2021 6\' 3"  (1.905 m)    Weight:   Wt Readings from Last 1 Encounters:  06/21/21 120.8 kg    BMI:  Body mass index is 33.29 kg/m.  Estimated Nutritional Needs:   Kcal:  2200-2400  Protein:  125-140 grams  Fluid:  >2.2 L     Theone Stanley., MS, RD, LDN (she/her/hers) RD pager number and weekend/on-call pager number located in [employer].

## 2021-06-21 NOTE — Progress Notes (Signed)
Occupational Therapy Treatment Patient Details Name: Adrian Foster MRN: [id number] DOB: [birth year] Today's Date: 06/21/2021   History of present illness Patient is a 87 yo M presents to [facility] on 12/8 after mechanical fall.  CT head showed 5 mm parafalcine subdural hematoma. Found to be hypoxic with transient episodes of responsiveness once tx to ICU. CXR- RLL PNA, Chest CT- PEs. Intubated 12/9-12/10.  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   OT comments  Adrian Foster is making great progress. Session completed in conjunction with PT to facilitate activity tolerance and functional transfers. He was on 6L O2 throughout session with SpO2 dropping to 85-89% during functional tasks, and would recover to 92-98% with verbal cues for pursed lip breathing and rest breaks. He was able to sit EOB this session with good midline balance, and no UE support. He also tolerated 2 sit<>stand transfers, standing ~3 minutes the first time. He required mod A +2 for step pivot transfer, verbal cues for safety dur to fatigue. He continues to benefit from OT acutely. D/c recommendation remains appropriate.    Recommendations for follow up therapy are one component of a multi-disciplinary discharge planning process, led by the attending physician.  Recommendations may be updated based on patient status, additional functional criteria and insurance authorization.    Follow Up Recommendations  Acute inpatient rehab (3hours/day)    Assistance Recommended at Discharge Frequent or constant Supervision/Assistance  Equipment Recommendations  BSC/3in1       Precautions / Restrictions Precautions Precautions: Fall Precaution Comments: keep SBP <160, large hematoma on sacrum Restrictions Weight Bearing Restrictions: No       Mobility Bed Mobility Overal bed mobility: Needs Assistance Bed Mobility: Rolling;Sidelying to Sit Rolling: Min assist Sidelying to  sit: Min assist;HOB elevated;+2 for physical assistance;+2 for safety/equipment       General bed mobility comments: pt required verbal cue for hand placement, and verbal cues to problem solve through the task of getting from supine>sitting. Overall with funcitonal improvement noted during bed mobility this session    Transfers Overall transfer level: Needs assistance Equipment used: Rolling walker (2 wheels) Transfers: Sit to/from Stand;Bed to chair/wheelchair/BSC Sit to Stand: Min assist;+2 physical assistance;+2 safety/equipment;From elevated surface   Step pivot transfers: Mod assist;+2 physical assistance;+2 safety/equipment       General transfer comment: heavy min A +2 for inital sit<>stand. sit<>stand 2x this session. Verbal cues for hand placement. Verbal cues for breathing technique. pt stood for ~3 minutes the first time with marching in place. Pt required heavy cues for step pivot transfer and was melting into a sitting posture with each step. Mod A +2 and verbal cues for safety to "stand tall"     Balance Overall balance assessment: Needs assistance Sitting-balance support: Feet supported Sitting balance-Leahy Scale: Fair Sitting balance - Comments: able to complete functioanl task in sitting without BUE supported   Standing balance support: Bilateral upper extremity supported;Reliant on assistive device for balance Standing balance-Leahy Scale: Poor           ADL either performed or assessed with clinical judgement   ADL Overall ADL's : Needs assistance/impaired     Grooming: Set up;Sitting         Toilet Transfer: Moderate assistance;+2 for physical assistance;+2 for safety/equipment;Stand-pivot;Rolling walker (2 wheels) Toilet Transfer Details (indicate cue type and reason): cues for breathing and staying inside of the walker. Pt attempting to sit too early due to fatigue  Functional mobility during ADLs: Moderate assistance;+2 for physical  assistance;+2 for safety/equipment General ADL Comments: focused on standing tolerance and trnasfers this session in preparation to OOB functional activity    Extremity/Trunk Assessment Upper Extremity Assessment Upper Extremity Assessment: Generalized weakness   Lower Extremity Assessment Lower Extremity Assessment: Defer to PT evaluation        Vision   Vision Assessment?: No apparent visual deficits   Perception Perception Perception: Not tested   Praxis Praxis Praxis: Not tested    Cognition Arousal/Alertness: Awake/alert Behavior During Therapy: WFL for tasks assessed/performed Overall Cognitive Status: Impaired/Different from baseline Area of Impairment: Memory;Following commands;Safety/judgement;Problem solving;Attention;Awareness                   Current Attention Level: Sustained Memory: Decreased short-term memory Following Commands: Follows one step commands with increased time Safety/Judgement: Decreased awareness of deficits;Decreased awareness of safety   Problem Solving: Slow processing;Difficulty sequencing;Requires verbal cues;Requires tactile cues;Decreased initiation General Comments: Pt pleasant and motivated to participate. He requires incrased time for processing and initiation of all tasks with poor problem solving noted. He also requires verbal cues for proper breathing throughout entire session as SpO2 dropped to 85-89% with any movement          Exercises Exercises: Other exercises Other Exercises Other Exercises: pursed lip breathing.   Shoulder Instructions       General Comments SpO2 between 85-89% during/after functional activity on 6L [place]. required cues to breathing with O2 reovery to 92-98%    Pertinent Vitals/ Pain       Pain Assessment: Faces (Simultaneous filing. User may not have seen previous data.) Faces Pain Scale: Hurts a little bit Pain Location: generalized with movement Pain Descriptors / Indicators:  Grimacing Pain Intervention(s): Limited activity within patient's tolerance;Monitored during session   Frequency  Min 2X/week        Progress Toward Goals  OT Goals(current goals can now be found in the care plan section)  Progress towards OT goals: Progressing toward goals  Acute Rehab OT Goals OT Goal Formulation: With patient/family Time For Goal Achievement: 06/26/21 Potential to Achieve Goals: Good ADL Goals Pt Will Transfer to Toilet: with min assist;stand pivot transfer;bedside commode Pt/caregiver will Perform Home Exercise Program: Increased strength;Both right and left upper extremity;With minimal assist;With written HEP provided;With theraband Additional ADL Goal #1: Pt will increase to x6 mins of OOB ADL tasks in sitting with set-upA.  Plan Discharge plan remains appropriate    Co-evaluation    PT/OT/SLP Co-Evaluation/Treatment: Yes Reason for Co-Treatment: Complexity of the patient's impairments (multi-system involvement);For patient/therapist safety;To address functional/ADL transfers   OT goals addressed during session: ADL's and self-care      AM-PAC OT "6 Clicks" Daily Activity     Outcome Measure   Help from another person eating meals?: A Little Help from another person taking care of personal grooming?: A Little Help from another person toileting, which includes using toliet, bedpan, or urinal?: A Lot Help from another person bathing (including washing, rinsing, drying)?: A Lot Help from another person to put on and taking off regular upper body clothing?: A Little Help from another person to put on and taking off regular lower body clothing?: A Lot 6 Click Score: 15    End of Session Equipment Utilized During Treatment: Gait belt;Rolling walker (2 wheels);Oxygen  OT Visit Diagnosis: Unsteadiness on feet (R26.81);Muscle weakness (generalized) (M62.81);Pain;Other symptoms and signs involving cognitive function   Activity Tolerance Patient  tolerated treatment well   Patient Left  in chair;with call bell/phone within reach;with chair alarm set;with family/visitor present   Nurse Communication Mobility status        Time: 3013-1438 OT Time Calculation (min): 38 min  Charges: OT General Charges $OT Visit: 1 Visit OT Treatments $Therapeutic Activity: 8-22 mins   Kendre Sires A Elisama Thissen 06/21/2021, 12:40 PM

## 2021-06-21 NOTE — Progress Notes (Signed)
Inpatient Rehabilitation Admissions Coordinator   Peer to peer completed with Dr Pietro Cassis and Bernadene Bell MD. We have since sent updated clinicals per their request.  Danne Baxter, RN, MSN Rehab Admissions Coordinator [phone] 06/21/2021 3:38 PM

## 2021-06-21 NOTE — Plan of Care (Addendum)
*  Error previous edit TT, RN  Pt sat in the chair for about an hour before returning to bed. Pt O2 drops a few times with activity but pt manages to bring it back up with deep breathing and coughing.   Problem: Education: Goal: Knowledge of General Education information will improve Description: Including pain rating scale, medication(s)/side effects and non-pharmacologic comfort measures Outcome: Progressing   Problem: Health Behavior/Discharge Planning: Goal: Ability to manage health-related needs will improve Outcome: Progressing   Problem: Clinical Measurements: Goal: Ability to maintain clinical measurements within normal limits will improve Outcome: Progressing Goal: Will remain free from infection Outcome: Progressing Goal: Diagnostic test results will improve Outcome: Progressing Goal: Respiratory complications will improve Outcome: Progressing Goal: Cardiovascular complication will be avoided Outcome: Progressing   Problem: Activity: Goal: Risk for activity intolerance will decrease Outcome: Progressing   Problem: Nutrition: Goal: Adequate nutrition will be maintained Outcome: Progressing   Problem: Coping: Goal: Level of anxiety will decrease Outcome: Progressing   Problem: Elimination: Goal: Will not experience complications related to bowel motility Outcome: Progressing Goal: Will not experience complications related to urinary retention Outcome: Progressing   Problem: Pain Managment: Goal: General experience of comfort will improve Outcome: Progressing   Problem: Safety: Goal: Ability to remain free from injury will improve Outcome: Progressing   Problem: Skin Integrity: Goal: Risk for impaired skin integrity will decrease Outcome: Progressing

## 2021-06-21 NOTE — Progress Notes (Signed)
Pt placed back on bipap at this time after removing it several hours ago.  Pt tolerating well at this time.

## 2021-06-21 NOTE — Progress Notes (Signed)
NAME:  Adrian Foster, MRN:  [id number], DOB:  Apr 20, 1934, LOS: 46 ADMISSION DATE:  06/15/2021, CONSULTATION DATE:  06/13/2021 REFERRING MD:  Dr. Maryland Pink, CHIEF COMPLAINT:  Pleural Effusion   History of Present Illness:  87 year old male presents to [facility] on 12/8 after mechanical fall the night prior on coumadin oncologist recommended he come to ED.   On arrival to [facility] ED on 12/8, patient was found to be hypoxic in 70s on room air. Sats improved with supplemental O2. Patient has been taking Coumadin for subsegmental PE outpatient. INR was 8.8. CT chest shows PE within segmental and subsegmental branches on RLL. CT head showed 5 mm parafalcine subdural hematoma. Neurosurgery consulted and recommend no surgery and recommend not severe enough to reverse anticoagulation. Vitamin K given to improve INR to allow [place] to start patient on heparin drip. CXR shows possible RLL pneumonia; given ceftriaxone/azithromycin. 12/9 intubated for encephalopathy and hypoxemic/hypercapnic respiratory failure. Extubated 12/10.  Patient transferred out of ICU 12/11. 12/12 pulmonary consulted for evaluation of worsening right hemithorax with near complete opacification.   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 Baird Cancer at [facility] > treated with radiation in 01/2021  Significant Hospital Events: Including procedures, antibiotic start and stop dates in addition to other pertinent events   12/8: admitted to [facility] on 12/8 for hypoxia 12/9 early morning intubated for encephalopathy and hypoxemic/hypercapnic respiratory failure 12/10 extubated  12/13 started on bipap for hypercapnia 12/19 worsening CXR, started back on hypertonic saline and metaneb  Interim History / Subjective:   Slept with BIPAP overnight metaneb  Objective   Blood pressure 107/63, pulse 82, temperature 98.1  F (36.7 C), resp. rate 19, height 6\' 3"  (1.905 m), weight 120.8 kg, SpO2 100 %.    FiO2 (%):  [40 %] 40 %   Intake/Output Summary (Last 24 hours) at 06/21/2021 0817 Last data filed at 06/21/2021 9379 Gross per 24 hour  Intake 448.38 ml  Output 1450 ml  Net -1001.62 ml   Filed Weights   06/18/21 0500 06/20/21 0500 06/21/21 0500  Weight: 120.2 kg 120.9 kg 120.8 kg    Examination:  General:  Chronically ill appearing, resting comfortably in bed HENT: NCAT OP clear PULM: Improved air movement in the right lung, clear on left, normal effort CV: RRR, no mgr GI: BS+, soft, nontender MSK: normal bulk and tone Neuro: awake, alert, no distress, MAEW  12/20 CXR > personally reviewed, no significant change in right lung infiltrate  Resolved Hospital Problem list     Assessment & Plan:   Acute Hypoxic/Hypercarbic Respiratory Failure in setting pseudomonas pneumonia and aspiration pneumonia, worsening oxygenation 12/18-12/19 with worsening CXR findings after nearly all pulmonary toilette measures were held Improved air movement 12/20 but CXR unchanged, oxygenation/hypercarbia would not allow for a safe bronchoscopy Segmental/Subsegmental RLL PE Stage IIb squamous cell lung cancer RUL treated with XRT 01/2021 Bronchiectasis and RLL aspiration at baseline  Plan Zosyn 14 days Continue hypertonic saline and scheduled duoneb Chest PT: metaneb NTS prn Add back guaifenesin Aspiration precautions BIPAP qHS to continue    Subdural Hematoma on warfarin Concern for Seizure > transient episode of decreased responsiveness and right upper extremity jerking  Altered Mental Status  Plan Would consider using eliquis, discussed with patient and wife Otherwise per neuro Needs CIR > would be safest option for him   PCCM  will continue to follow  Discussed with Dr. Ver[facility] (right click and "Reselect all SmartList Selections" daily)   Remaining management per primary team.    Labs   CBC: Recent Labs  Lab 06/17/21 0240 06/18/21 0211 06/19/21 0711 06/20/21 0711 06/21/21 0334  WBC 5.5 6.0 6.8 5.3   5.1 5.6  NEUTROABS  --   --   --  4.6 4.9  HGB 9.2* 8.9* 9.1* 8.8*   8.5* 8.4*  HCT 32.2* 31.5* 32.7* 31.1*   31.0* 29.4*  MCV 100.3* 102.6* 103.5* 105.4*   105.8* 103.2*  PLT 213 221 208 214   209 161    Basic Metabolic Panel: Recent Labs  Lab 06/17/21 0240 06/18/21 0211 06/19/21 0711 06/20/21 0711 06/21/21 0334  NA 142 143 144 145 144  K 4.0 3.9 3.9 3.9 3.3*  CL 97* 98 98 97* 94*  CO2 39* 40* 42* >45* >45*  GLUCOSE 118* 130* 107* 106* 130*  BUN 19 21 21  24* 22  CREATININE 0.73 0.66 0.61 0.61 0.58*  CALCIUM 9.0 8.9 9.2 9.0 8.9   GFR: Estimated Creatinine Clearance: 94.5 mL/min (A) (by C-G formula based on SCr of 0.58 mg/dL (L)). Recent Labs  Lab 06/18/21 0211 06/19/21 0711 06/20/21 0711 06/21/21 0334  PROCALCITON  --   --  21.03 <0.10  WBC 6.0 6.8 5.3   5.1 5.6    Liver Function Tests: Recent Labs  Lab 06/16/21 0338  AST 26  ALT 16  ALKPHOS 62  BILITOT 0.9  PROT 5.5*  ALBUMIN 1.5*   No results for input(s): LIPASE, AMYLASE in the last 168 hours. No results for input(s): AMMONIA in the last 168 hours.  ABG    Component Value Date/Time   PHART 7.410 06/21/2021 0754   PCO2ART 73.9 (HH) 06/21/2021 0754   PO2ART 76.8 (L) 06/21/2021 0754   HCO3 46.2 (H) 06/21/2021 0754   TCO2 43 (H) 06/10/2021 0905   O2SAT 96.6 06/21/2021 0754     Coagulation Profile: Recent Labs  Lab 06/17/21 0240 06/18/21 0211 06/19/21 0711 06/20/21 0711 06/21/21 0334  INR 3.2* 2.1* 1.5* 1.5* 1.5*    Cardiac Enzymes: No results for input(s): CKTOTAL, CKMB, CKMBINDEX, TROPONINI in the last 168 hours.  HbA1C: Hgb A1c MFr Bld  Date/Time Value Ref Range Status  06/17/2021 02:40 AM 6.4 (H) 4.8 - 5.6 % Final    Comment:    (NOTE) Pre diabetes:          5.7%-6.4%  Diabetes:              >6.4%  Glycemic control for   <7.0% adults with  diabetes     CBG: Recent Labs  Lab 06/20/21 0934 06/20/21 1253 06/20/21 1718 06/20/21 2104 06/21/21 0625  GLUCAP 99 122* 184* 162* 106*    Roselie Awkward, MD [facility] Pager: [phone] Cell: [phone] After 7:00 pm call Elink  [phone]

## 2021-06-21 NOTE — Progress Notes (Signed)
Physical Therapy Treatment Patient Details Name: Adrian Foster MRN: [id number] DOB: [birth year] Today's Date: 06/21/2021   History of Present Illness Patient is a 87 yo M presents to [facility] on 12/8 after mechanical fall.  CT head showed 5 mm parafalcine subdural hematoma. Found to be hypoxic with transient episodes of responsiveness once tx to ICU. CXR- RLL PNA, Chest CT- PEs. Intubated 12/9-12/10.  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    PT Comments    Pt received in supine, agreeable to therapy session and with good participation in transfer training. Pt with more effortful breathing today with exertion and SpO2 desat to 85%-89% after positional changes and increased time needed for SpO2 to return to >90% on 6L HF [place] (2-3 minutes with postural and pursed-lip breathing cues). Pt needing up to +2 modA for transfers and tolerated standing tasks for up to 3 minutes at a time. Pt encouraged to remain up in chair at least one hour and lift pad underneath him in chair in case he is too fatigued to pivot back with nursing staff. Pt reports moderate fatigue at end of session but appears very fatigued. Pt continues to benefit from PT services to progress toward functional mobility goals.    Recommendations for follow up therapy are one component of a multi-disciplinary discharge planning process, led by the attending physician.  Recommendations may be updated based on patient status, additional functional criteria and insurance authorization.  Follow Up Recommendations  Acute inpatient rehab (3hours/day)     Assistance Recommended at Discharge Frequent or constant Supervision/Assistance  Equipment Recommendations  Other (comment) (TBA pending progress)    Recommendations for Other Services       Precautions / Restrictions Precautions Precautions: Fall Precaution Comments: keep SBP <160, large hematoma on  sacrum Restrictions Weight Bearing Restrictions: No     Mobility  Bed Mobility Overal bed mobility: Needs Assistance Bed Mobility: Rolling;Sidelying to Sit Rolling: Min assist Sidelying to sit: Min assist;HOB elevated;+2 for physical assistance;+2 for safety/equipment       General bed mobility comments: pt required verbal cue for hand placement, and verbal cues to problem solve through the task of getting from supine>sitting. Overall with functional improvement noted during bed mobility this session.    Transfers Overall transfer level: Needs assistance Equipment used: Rolling walker (2 wheels) Transfers: Sit to/from Stand;Bed to chair/wheelchair/BSC Sit to Stand: Min assist;+2 physical assistance;+2 safety/equipment;From elevated surface     Step pivot transfers: Mod assist;+2 physical assistance;+2 safety/equipment;From elevated surface     General transfer comment: heavy min A +2 for inital sit<>stand. sit<>stand 2x this session. Verbal cues for hand placement. Verbal cues for breathing technique. pt stood for ~3 minutes the first time with marching in place. Pt required heavy cues for step pivot transfer and was melting into a sitting posture with each step. Mod A +2 and verbal cues for safety to "stand tall"    Ambulation/Gait   Pre-gait activities: standing hip flexion/weight shifting x5 reps ea with RW support     Modified Rankin (Stroke Patients Only) Modified Rankin (Stroke Patients Only) Pre-Morbid Rankin Score: Moderate disability Modified Rankin: Severe disability     Balance Overall balance assessment: Needs assistance Sitting-balance support: Feet supported Sitting balance-Leahy Scale: Fair Sitting balance - Comments: able to complete functional task in sitting without BUE support   Standing balance support: Bilateral upper extremity supported;Reliant on assistive device for balance Standing balance-Leahy Scale: Poor Standing  balance comment: heavily  reliant on RW support     Cognition Arousal/Alertness: Awake/alert Behavior During Therapy: WFL for tasks assessed/performed Overall Cognitive Status: Impaired/Different from baseline Area of Impairment: Memory;Following commands;Safety/judgement;Problem solving;Attention;Awareness   Current Attention Level: Sustained Memory: Decreased short-term memory Following Commands: Follows one step commands with increased time Safety/Judgement: Decreased awareness of deficits;Decreased awareness of safety   Problem Solving: Slow processing;Difficulty sequencing;Requires verbal cues;Requires tactile cues;Decreased initiation General Comments: Pt pleasant and motivated to participate. He requires increased time for processing and initiation of all tasks with poor problem solving noted. He also requires verbal cues for proper breathing throughout entire session as SpO2 dropped to 85-89% with any movement, took time to recover. Pt tending to breathe in through mouth rather than through nose.        Exercises General Exercises - Lower Extremity Ankle Circles/Pumps: AROM;Both;10 reps;Supine Hip Flexion/Marching: AROM;Both;5 reps;Standing Other Exercises Other Exercises: pursed lip breathing with each positional change for 2-3 minutes, flutter valve x10 reps and IS x 5 reps prior to transfer to EOB    General Comments General comments (skin integrity, edema, etc.): SpO2 between 85-89% during/after functional activity on 6L [place], improves to >90% after 2-3 minutes with cues for pursed-lip breathing and O2 recovery to 92-98%      Pertinent Vitals/Pain Pain Assessment: Faces Faces Pain Scale: Hurts a little bit Pain Location: generalized with movement Pain Descriptors / Indicators: Grimacing Pain Intervention(s): Limited activity within patient's tolerance;Monitored during session;Repositioned     PT Goals (current goals can now be found in the care plan section) Acute Rehab PT Goals Patient Stated  Goal: Per wife, get back to independence PT Goal Formulation: With patient Time For Goal Achievement: 06/26/21 Progress towards PT goals: Progressing toward goals    Frequency    Min 4X/week      PT Plan Current plan remains appropriate    Co-evaluation PT/OT/SLP Co-Evaluation/Treatment: Yes Reason for Co-Treatment: Complexity of the patient's impairments (multi-system involvement);For patient/therapist safety;To address functional/ADL transfers PT goals addressed during session: Mobility/safety with mobility;Balance;Proper use of DME;Strengthening/ROM OT goals addressed during session: ADL's and self-care      AM-PAC PT "6 Clicks" Mobility   Outcome Measure  Help needed turning from your back to your side while in a flat bed without using bedrails?: A Little Help needed moving from lying on your back to sitting on the side of a flat bed without using bedrails?: A Lot Help needed moving to and from a bed to a chair (including a wheelchair)?: A Lot Help needed standing up from a chair using your arms (e.g., wheelchair or bedside chair)?: A Lot Help needed to walk in hospital room?: Total Help needed climbing 3-5 steps with a railing? : Total 6 Click Score: 11    End of Session Equipment Utilized During Treatment: Gait belt;Oxygen (6L HF [place]) Activity Tolerance: Patient tolerated treatment well Patient left: in chair;with call bell/phone within reach;with chair alarm set;Other (comment);with family/visitor present (lift pad in chair for pt safety as he fatigued quickly when pivoting initially, pt daughter present in room and meal tray in front of him) Nurse Communication: Mobility status;Need for lift equipment (lift pad under him for safety) PT Visit Diagnosis: Hemiplegia and hemiparesis;Unsteadiness on feet (R26.81);Difficulty in walking, not elsewhere classified (R26.2) Hemiplegia - Right/Left: Right Hemiplegia - dominant/non-dominant: Dominant Hemiplegia - caused by:  Cerebral infarction     Time: 0960-4540 PT Time Calculation (min) (ACUTE ONLY): 33 min  Charges:  $Therapeutic Activity: 8-22 mins  Houston Siren., PTA Acute Rehabilitation Services Pager: [phone] Office: [place] 06/21/2021, 1:01 PM

## 2021-06-21 NOTE — Progress Notes (Signed)
ANTICOAGULATION CONSULT NOTE  Pharmacy Consult for warfarin Indication: pulmonary embolus and hx DVT/PE  Allergies  Allergen Reactions   Azithromycin Nausea And Vomiting   Levofloxacin Diarrhea    Patient Measurements: Height: 6\' 3"  (190.5 cm) Weight: 120.8 kg (266 lb 5.1 oz) IBW/kg (Calculated) : 84.5 Heparin Dosing Weight: 106.6 kg  Vital Signs: Temp: 98.1 F (36.7 C) (12/20 0707) Temp Source: Oral (12/20 0707) BP: 111/59 (12/20 0707) Pulse Rate: 73 (12/20 0707)  Labs: Recent Labs    06/19/21 0711 06/20/21 0711 06/21/21 0334  HGB 9.1* 8.8*   8.5* 8.4*  HCT 32.7* 31.1*   31.0* 29.4*  PLT 208 214   209 199  LABPROT 18.4* 18.0* 18.1*  INR 1.5* 1.5* 1.5*  CREATININE 0.61 0.61 0.58*     Estimated Creatinine Clearance: 94.5 mL/min (A) (by C-G formula based on SCr of 0.58 mg/dL (L)).  Assessment: 87 yo male with metastatic prostate cancer receiving radiation and on chronic warfarin therapy for history of DVT and PE who was admitted post fall with new small SDH. INR of 8.8 on admission 12/08, Vitamin K 5mg  IV given x1. INR trended down to 1.5. CT showed multiple small pulmonary embolisms. CCM made decision to continue with warfarin - consulted Pharmacy to resume 12/11.   PTA warfarin dose: 5mg  MWF and 2.5mg  all other days.   Patient has now been subtherapeutic x 3 days.  MD has requested bridge with Lovenox until INR therapeutic.    FYI, Pharmacist spoke with patient and wife about changing to apixaban on 12/19. Provided wife education sheet and copay information - she is not interested in changing right now but will consider. Copay $47/month.  Goal of Therapy:  INR 2-3 Monitor platelets by anticoagulation protocol: Yes   Plan:  Start Lovenox 120mg  sq q12h Repeat warfarin 4 mg PO today  Continue to monitor daily INR, H&H and platelets Monitor closely for s/sx bleeding    Thank you for involving pharmacy in this patient's care.  Horton Chin, PharmD,  BCPS Clinical Pharmacist Clinical phone for 06/21/2021 until 3p is [phone] 06/21/2021 1:30 PM  **Pharmacist phone directory can be found on [email or web address] listed under [place]**

## 2021-06-21 NOTE — Progress Notes (Signed)
Speech Language Pathology Treatment: Dysphagia  Patient Details Name: Adrian Foster MRN: [id number] DOB: 1934-05-07 Today's Date: 06/21/2021 Time: 4469-5072 SLP Time Calculation (min) (ACUTE ONLY): 25 min  Assessment / Plan / Recommendation Clinical Impression  Adrian Foster was sitting in the recliner after his OT/PT session, alert, interactive, with his daughter Adrian Foster at the bedside.  He continues to reluctantly drink nectar liquids; today was able to verbalize the rationale. We discussed the plan to repeat MBS in the next few days, and I reiterated that thickening the liquids is a temporary measure.  He drank several ounces of thickened water with no s/sx of aspiration.  RMST resistance modified - pt had difficulty identifying a resistance level near 7/10. We settled on 35 cm H20 after some trial and error - he completed three sets of five with intermittent verbal cues needed to exhale hard and fast.  Discussed practice schedule to complete when he is alone in the room.  SLP will continue to follow for dysphagia treatment.   HPI HPI: Patient is a 87 yo M w/ pertinent pmh of CAD s/p MI, DVT/PE on Coumadin, T2DM, HTN, metastatic prostate cancer w/ mets to lungs on active radiation presents to [facility] on 12/8 after mechanical fall.  CT head showed 5 mm parafalcine subdural hematoma. CXR shows possible RLL pneumonia; given ceftriaxone/azithromycin. Intubated 12/9-10. MBS 12/12, rec regular solids, nectar thick liquids      SLP Plan  Continue with current plan of care      Recommendations for follow up therapy are one component of a multi-disciplinary discharge planning process, led by the attending physician.  Recommendations may be updated based on patient status, additional functional criteria and insurance authorization.    Recommendations  Diet recommendations: Regular;Nectar-thick liquid Liquids provided via: Cup;Straw Medication Administration: Whole meds with puree Supervision: Full  supervision/cueing for compensatory strategies;Patient able to self feed Compensations: Slow rate;Small sips/bites;Multiple dry swallows after each bite/sip Postural Changes and/or Swallow Maneuvers: Seated upright 90 degrees                Oral Care Recommendations: Oral care BID Follow Up Recommendations: Acute inpatient rehab (3hours/day) Assistance recommended at discharge: Frequent or constant Supervision/Assistance SLP Visit Diagnosis: Dysphagia, pharyngeal phase (R13.13) Plan: Continue with current plan of care          Adrian Foster, [place] CCC/SLP Acute Rehabilitation Services Office number [phone] Pager [phone]  Adrian Foster  06/21/2021, 1:39 PM

## 2021-06-22 ENCOUNTER — Inpatient Hospital Stay (HOSPITAL_COMMUNITY): Payer: Medicare Other

## 2021-06-22 DIAGNOSIS — R0609 Other forms of dyspnea: Secondary | ICD-10-CM

## 2021-06-22 LAB — CBC WITH DIFFERENTIAL/PLATELET
Abs Immature Granulocytes: 0.03 10*3/uL (ref 0.00–0.07)
Basophils Absolute: 0 10*3/uL (ref 0.0–0.1)
Basophils Relative: 0 %
Eosinophils Absolute: 0.1 10*3/uL (ref 0.0–0.5)
Eosinophils Relative: 1 %
HCT: 29.2 % — ABNORMAL LOW (ref 39.0–52.0)
Hemoglobin: 8.1 g/dL — ABNORMAL LOW (ref 13.0–17.0)
Immature Granulocytes: 1 %
Lymphocytes Relative: 5 %
Lymphs Abs: 0.3 10*3/uL — ABNORMAL LOW (ref 0.7–4.0)
MCH: 28.5 pg (ref 26.0–34.0)
MCHC: 27.7 g/dL — ABNORMAL LOW (ref 30.0–36.0)
MCV: 102.8 fL — ABNORMAL HIGH (ref 80.0–100.0)
Monocytes Absolute: 0.3 10*3/uL (ref 0.1–1.0)
Monocytes Relative: 4 %
Neutro Abs: 5 10*3/uL (ref 1.7–7.7)
Neutrophils Relative %: 89 %
Platelets: 188 10*3/uL (ref 150–400)
RBC: 2.84 MIL/uL — ABNORMAL LOW (ref 4.22–5.81)
RDW: 16.3 % — ABNORMAL HIGH (ref 11.5–15.5)
WBC: 5.7 10*3/uL (ref 4.0–10.5)
nRBC: 0 % (ref 0.0–0.2)

## 2021-06-22 LAB — GLUCOSE, CAPILLARY
Glucose-Capillary: 114 mg/dL — ABNORMAL HIGH (ref 70–99)
Glucose-Capillary: 114 mg/dL — ABNORMAL HIGH (ref 70–99)
Glucose-Capillary: 115 mg/dL — ABNORMAL HIGH (ref 70–99)
Glucose-Capillary: 121 mg/dL — ABNORMAL HIGH (ref 70–99)

## 2021-06-22 LAB — BASIC METABOLIC PANEL
Anion gap: 3 — ABNORMAL LOW (ref 5–15)
BUN: 23 mg/dL (ref 8–23)
CO2: 44 mmol/L — ABNORMAL HIGH (ref 22–32)
Calcium: 8.8 mg/dL — ABNORMAL LOW (ref 8.9–10.3)
Chloride: 97 mmol/L — ABNORMAL LOW (ref 98–111)
Creatinine, Ser: 0.71 mg/dL (ref 0.61–1.24)
GFR, Estimated: >60 mL/min (ref >60)
Glucose, Bld: 120 mg/dL — ABNORMAL HIGH (ref 70–99)
Potassium: 3.4 mmol/L — ABNORMAL LOW (ref 3.5–5.1)
Sodium: 144 mmol/L (ref 135–145)

## 2021-06-22 LAB — PROTIME-INR
INR: 1.8 — ABNORMAL HIGH (ref 0.8–1.2)
Prothrombin Time: 20.7 seconds — ABNORMAL HIGH (ref 11.4–15.2)

## 2021-06-22 LAB — ECHOCARDIOGRAM COMPLETE
AR max vel: 1.97 cm2
AV Area VTI: 2.03 cm2
AV Area mean vel: 1.96 cm2
AV Mean grad: 8.3 mmHg
AV Peak grad: 16.5 mmHg
Ao pk vel: 2.03 m/s
Height: 75 in
S' Lateral: 3 cm
Weight: 4261.05 oz

## 2021-06-22 LAB — PROCALCITONIN: Procalcitonin: <0.1 ng/mL

## 2021-06-22 MED ORDER — POTASSIUM CHLORIDE CRYS ER 20 MEQ PO TBCR
40.0000 meq | EXTENDED_RELEASE_TABLET | Freq: Once | ORAL | Status: AC
  Administered 2021-06-22: 12:00:00 40 meq via ORAL
  Filled 2021-06-22: qty 2

## 2021-06-22 MED ORDER — WARFARIN SODIUM 4 MG PO TABS: 4.0000 mg | ORAL_TABLET | Freq: Once | ORAL | Status: DC

## 2021-06-22 MED ORDER — PERFLUTREN LIPID MICROSPHERE
1.0000 mL | INTRAVENOUS | Status: AC | PRN
Start: 2021-06-22 — End: 2021-06-22
  Administered 2021-06-22: 09:00:00 2 mL via INTRAVENOUS
  Filled 2021-06-22: qty 10

## 2021-06-22 MED ORDER — APIXABAN 5 MG PO TABS
5.0000 mg | ORAL_TABLET | Freq: Two times a day (BID) | ORAL | Status: DC
Start: 2021-06-22 — End: 2021-06-23
  Administered 2021-06-22 – 2021-06-23 (×2): 5 mg via ORAL
  Filled 2021-06-22 (×2): qty 1

## 2021-06-22 NOTE — Progress Notes (Signed)
PT Cancellation Note  Patient Details Name: Adrian Foster MRN: [id number] DOB: 01-01-1934   Cancelled Treatment:    Reason Eval/Treat Not Completed: (P) Patient not medically ready (RN defer, pt currently with increased respiratory difficulty.) Will continue efforts per PT plan of care as schedule permits.   Kara Pacer Sheanna Dail 06/22/2021, 4:11 PM

## 2021-06-22 NOTE — Progress Notes (Signed)
PROGRESS NOTE  Adrian Foster  DOB: 02-19-1934  PCP: Charleston Poot, MD [facility]:[id number]  DOA: 06/06/2021  LOS: 13 days  Hospital Day: 20  Chief Complaint  Patient presents with   Fall    Brief narrative: Adrian Foster is a 87 y.o. male with PMH significant for DM2, HTN, CAD/MI, DVT/PE on Coumadin, metastatic prostate cancer with mets to lungs on active radiation. Patient was brought to the ED on 12/8 after a mechanical fall while on Coumadin..  In the ED, patient was found to be hypoxic in 70s on room air and required supplemental oxygen. INR was elevated to 8.8 CT chest showed PE within segmental and subsegmental branches on RLL.  CT head showed 5 mm parafalcine subdural hematoma.  Admitted to hospital service Neurosurgery was consulted, did not recommend surgical intervention orders reversal of anticoagulation.  Patient was given vitamin K and also subsequently started on heparin drip  CXR showed possible RLL pneumonia; started on ceftriaxone/azithromycin.  12/9, patient was intubated for encephalopathy and hypoxemic/hypercapnic respiratory failure. 12/10, extubated  12/11, transferred out of ICU  12/12, pulmonary reconsulted for evaluation of worsening right hemithorax with near complete opacification.  12/13, started on BiPAP for hypercapnia.   Subjective: Patient was seen and examined this morning. Pleasant elderly male. Propped up in bed.  Not in distress. On 2 L oxygen by nasal cannula. Wife at bedside. Propped up in bed. Not in distress. On 4 L oxygen nasal cannula.  Was on BiPAP last night. Patient and his wife are agreeable to switch from Coumadin to Eliquis. Insurance denied acute inpatient rehab.  Assessment/Plan: Acute hypoxic/hypercarbic respiratory failure -Likely secondary to pneumonia.  Also contributed by PE and lung mets.   -Was on mechanical ventilation from 12/9-12/10 and was successfully extubated. However on 12/13, patient was started on BiPAP again for  worsening hypoxia.  He has been on BiPAP nightly and as needed since then. -Continue nightly BiPAP. -Also getting flutter valve and incentive spirometry.  -Chest x-ray repeated on 12/19 showed worsening pleural fluid, volume loss and infiltrate in the right lung and persistent left lower lobe pneumonia.  Patient probably is having frequent aspirations.  Antibiotic course was prolonged till 12/25.  Lasix 1 dose 40 mg IV was also given.    Aspiration pneumonia Pseudomonas in tracheal aspirate -Chest x-ray repeated on 12/19 worsening right-sided infiltrates.  Patient probably is chronically aspirating.  Speech therapy to follow  -Currently on IV Zosyn, antibiotics planned till 12/25..  I would continue IV while he is in the hospital  Segmental right lower lobe pulmonary embolism Previous history of DVT and PE Supratherapeutic INR -Patient has history of DVT PE in the past as well and was chronically on Coumadin. -Patient presented with INR elevated to 8.8 and at the same time had bilateral PE .  -He initially required anticoagulation reversal after which INR trended down.  -His INR has been fluctuating wildly.  I discussed with patient and his wife.  They are agreeable to switching to Eliquis.  Acute metabolic encephalopathy -In the setting of hypoxemia, subdural hematoma, prolonged hospitalization -Mental status stable in last 24 hours.  Haldol as needed.   Subdural hematoma -Patient was noted on CT head on admission.  Small in size.  No intervention per neurosurgery.  Neurology has cleared him to take anticoagulation.   -Per neurology note and recommendation from 12/12, patient has been on Vimpat.  He will continue it for 2 to 3 months and follow-up with neurology.  Can be considered for  weaning off antiseizure medication at that time.   -Continue seizure precautions.    History of CAD/MI -PTA on aspirin, statin  Type 2 diabetes mellitus -Was on metformin prior to  presentation -Currently on sliding scale insulin with Accu-Cheks. -Blood sugar level improved after he was initiated on diet.  Plan is to resume metformin at discharge Recent Labs  Lab 06/21/21 1025 06/21/21 1556 06/21/21 2051 06/22/21 0611 06/22/21 1220  GLUCAP 170* 114* 156* 114* 114*   Essential hypertension -PTA on torsemide and metolazone.  Both of them are currently on hold.  Blood pressure remains on lower side of normal. Continue to monitor.   History of metastatic prostate cancer with mets to lungs -Gets treatment at [facility] under the care of Dr. Baird Cancer.  Had been getting radiation treatments.  Can follow-up with his oncologist post discharge.    Dilated small bowel loops -Noted incidentally on CT scan.  Abdomen is benign.  Continue bowel regimen.  Continue to monitor.   Indeterminate right renal lesion -Will need outpatient MRI for same.   Severe protein calorie malnutrition -Dietitian consulted.  Supplements ordered  Mobility: Encourage ambulation Living condition: Lives at home Goals of care:   Code Status: Full Code  Nutritional status: Body mass index is 33.29 kg/m.  Nutrition Problem: Severe Malnutrition Etiology: chronic illness, cancer and cancer related treatments Signs/Symptoms: severe muscle depletion, severe fat depletion Diet:  Diet Order             Diet regular Room service appropriate? Yes with Assist; Fluid consistency: Nectar Thick  Diet effective now                  DVT prophylaxis: Coumadin  apixaban (ELIQUIS) tablet 5 mg   Antimicrobials: IV Zosyn, extended till 12/25. Fluid: None Consultants: Neurology, pulmonology Family Communication: Wife at bedside  Status is: Inpatient  Continue in-hospital care because: SNF Level of care: Progressive   Dispo: The patient is from: Home              Anticipated d/c is to: CIR denied by insurance.  Pending SNF              Patient currently is medically stable to d/c.   Difficult  to place patient No     Infusions:   sodium chloride 10 mL/hr at 06/16/21 1135   piperacillin-tazobactam (ZOSYN)  IV 3.375 g (06/22/21 0529)    Scheduled Meds:  apixaban  5 mg Oral BID   aspirin EC  81 mg Oral Daily   bisacodyl  10 mg Rectal Q0600   docusate sodium  100 mg Oral BID   guaiFENesin  1,200 mg Oral BID   insulin aspart  0-5 Units Subcutaneous QHS   insulin aspart  0-9 Units Subcutaneous TID WC   ipratropium-albuterol  3 mL Nebulization Q6H   lacosamide  50 mg Oral BID   multivitamin with minerals  1 tablet Oral Daily   pantoprazole  40 mg Oral Daily   polyethylene glycol  17 g Oral Daily   sodium chloride HYPERTONIC  4 mL Nebulization BID    PRN meds: sodium chloride, acetaminophen, albuterol, haloperidol lactate   Antimicrobials: Anti-infectives (From admission, onward)    Start     Dose/Rate Route Frequency Ordered Stop   06/14/21 2200  piperacillin-tazobactam (ZOSYN) IVPB 3.375 g        3.375 g 12.5 mL/hr over 240 Minutes Intravenous Every 8 hours 06/14/21 1212 06/26/21 2159   06/14/21 1300  piperacillin-tazobactam (  ZOSYN) IVPB 4.5 g        4.5 g 200 mL/hr over 30 Minutes Intravenous  Once 06/14/21 1212 06/14/21 1600   06/12/21 1200  ceFEPIme (MAXIPIME) 2 g in sodium chloride 0.9 % 100 mL IVPB  Status:  Discontinued        2 g 200 mL/hr over 30 Minutes Intravenous Every 8 hours 06/12/21 1113 06/14/21 1144   06/11/21 1200  cefTRIAXone (ROCEPHIN) 2 g in sodium chloride 0.9 % 100 mL IVPB  Status:  Discontinued        2 g 200 mL/hr over 30 Minutes Intravenous Every 24 hours 06/11/21 1025 06/12/21 1113   06/10/21 1600  cefTRIAXone (ROCEPHIN) 1 g in sodium chloride 0.9 % 100 mL IVPB  Status:  Discontinued        1 g 200 mL/hr over 30 Minutes Intravenous Every 24 hours 06/10/21 1025 06/11/21 1025   06/10/21 1600  azithromycin (ZITHROMAX) 500 mg in sodium chloride 0.9 % 250 mL IVPB  Status:  Discontinued        500 mg 250 mL/hr over 60 Minutes Intravenous  Every 24 hours 06/10/21 1025 06/12/21 1113   06/27/2021 1615  cefTRIAXone (ROCEPHIN) 1 g in sodium chloride 0.9 % 100 mL IVPB        1 g 200 mL/hr over 30 Minutes Intravenous  Once 06/03/2021 1605 06/26/2021 1716   06/29/2021 1615  azithromycin (ZITHROMAX) 500 mg in sodium chloride 0.9 % 250 mL IVPB        500 mg 250 mL/hr over 60 Minutes Intravenous  Once 06/25/2021 1605 06/21/2021 1750       Objective: Vitals:   06/22/21 0832 06/22/21 1215  BP: (!) 113/53 123/67  Pulse: (!) 103 98  Resp: 17 (!) 23  Temp: 99 F (37.2 C) 98 F (36.7 C)  SpO2: 99% 95%    Intake/Output Summary (Last 24 hours) at 06/22/2021 1323 Last data filed at 06/22/2021 0800 Gross per 24 hour  Intake 240 ml  Output 450 ml  Net -210 ml   Filed Weights   06/18/21 0500 06/20/21 0500 06/21/21 0500  Weight: 120.2 kg 120.9 kg 120.8 kg   Weight change:  Body mass index is 33.29 kg/m.   Physical Exam: General exam: Elderly Caucasian male.  Not in physical distress.  On oxygen by nasal cannula Skin: No rashes, lesions or ulcers. HEENT: Atraumatic, normocephalic, no obvious bleeding Lungs: Clear to auscultation bilaterally CVS: Regular rate and rhythm, no murmur. GI/Abd soft, nontender, nondistended, bowel sound present CNS: Alert, awake, oriented x3 Psychiatry: Mood appropriate Extremities: No pedal edema, no calf tenderness  Data Review: I have personally reviewed the laboratory data and studies available.  F/u labs ordered Unresulted Labs (From admission, onward)     Start     Ordered   06/21/21 0500  CBC with Differential/Platelet  Daily,   R     Question:  Specimen collection method  Answer:  Lab=Lab collect   06/20/21 0801   06/21/21 1696  Basic metabolic panel  Daily,   R     Question:  Specimen collection method  Answer:  Lab=Lab collect   06/20/21 0801            Signed, Terrilee Croak, MD [facility] 06/22/2021

## 2021-06-22 NOTE — Progress Notes (Signed)
NAME:  Adrian Foster, MRN:  [id number], DOB:  [birth year], LOS: 91 ADMISSION DATE:  06/17/2021, CONSULTATION DATE:  06/13/2021 REFERRING MD:  Dr. [place] Pink, CHIEF COMPLAINT:  Pleural Effusion   History of Present Illness:  87 year old male presents to [facility] on 12/8 after mechanical fall the night prior on coumadin oncologist recommended he come to ED.   On arrival to [facility] ED on 12/8, patient was found to be hypoxic in 70s on room air. Sats improved with supplemental O2. Patient has been taking Coumadin for subsegmental PE outpatient. INR was 8.8. CT chest shows PE within segmental and subsegmental branches on RLL. CT head showed 5 mm parafalcine subdural hematoma. Neurosurgery consulted and recommend no surgery and recommend not severe enough to reverse anticoagulation. Vitamin K given to improve INR to allow [place] to start patient on heparin drip. CXR shows possible RLL pneumonia; given ceftriaxone/azithromycin. 12/9 intubated for encephalopathy and hypoxemic/hypercapnic respiratory failure. Extubated 12/10.  Patient transferred out of ICU 12/11. 12/12 pulmonary consulted for evaluation of worsening right hemithorax with near complete opacification.   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 Baird Cancer at [facility] > treated with radiation in 01/2021  Significant Hospital Events: Including procedures, antibiotic start and stop dates in addition to other pertinent events   12/8: admitted to [facility] on 12/8 for hypoxia 12/9 early morning intubated for encephalopathy and hypoxemic/hypercapnic respiratory failure 12/10 extubated  12/13 started on bipap for hypercapnia 12/19 worsening CXR, started back on hypertonic saline and metaneb  Interim History / Subjective:   Desaturated on current flow of oxygen States he feels fine  Objective   Blood pressure 123/67,  pulse 98, temperature 98 F (36.7 C), temperature source Oral, resp. rate (!) 23, height 6\' 3"  (1.905 m), weight 120.8 kg, SpO2 95 %.    FiO2 (%):  [40 %] 40 %   Intake/Output Summary (Last 24 hours) at 06/22/2021 1258 Last data filed at 06/22/2021 0800 Gross per 24 hour  Intake 240 ml  Output 450 ml  Net -210 ml   Filed Weights   06/18/21 0500 06/20/21 0500 06/21/21 0500  Weight: 120.2 kg 120.9 kg 120.8 kg    Examination:  General: Chronically ill-appearing, appears comfortable HENT: Dry oral mucosa PULM: Decreased air movement bilaterally, poor effort CV: S1-S2 appreciated GI: Bowel sounds appreciated MSK: normal bulk and tone Neuro: Awake alert and interactive   12/20 chest x-ray-reviewed by myself, no significant change  Resolved Hospital Problem list     Assessment & Plan:   Acute hypoxemic/hypercarbic respiratory failure in the setting of Pseudomonas pneumonia Right lower lobe pulmonary embolism Stage IIb squamous cell lung cancer treated with radiation therapy 01/2021 Bronchiectasis on right lower lobe aspiration at baseline  -Continue MetaNeb treatments Continue pulmonary toileting 14 days of Zosyn recommended BiPAP nightly Aspiration precautions  Subdural hematoma on warfarin  Discharge plans to CIR will be safest  Will continue to follow  Best Practice (right click and "Reselect all SmartList Selections" daily)   Remaining management per primary team.   Labs   CBC: Recent Labs  Lab 06/18/21 0211 06/19/21 0711 06/20/21 0711 06/21/21 0334 06/22/21 0312  WBC 6.0 6.8 5.3   5.1 5.6 5.7  NEUTROABS  --   --  4.6 4.9 5.0  HGB 8.9* 9.1* 8.8*   8.5* 8.4* 8.1*  HCT 31.5* 32.7* 31.1*  31.0* 29.4* 29.2*  MCV 102.6* 103.5* 105.4*   105.8* 103.2* 102.8*  PLT 221 208 214   209 199 373    Basic Metabolic Panel: Recent Labs  Lab 06/18/21 0211 06/19/21 0711 06/20/21 0711 06/21/21 0334 06/22/21 0312  NA 143 144 145 144 144  K 3.9 3.9 3.9 3.3*  3.4*  CL 98 98 97* 94* 97*  CO2 40* 42* >45* >45* 44*  GLUCOSE 130* 107* 106* 130* 120*  BUN 21 21 24* 22 23  CREATININE 0.66 0.61 0.61 0.58* 0.71  CALCIUM 8.9 9.2 9.0 8.9 8.8*   GFR: Estimated Creatinine Clearance: 94.5 mL/min (by C-G formula based on SCr of 0.71 mg/dL). Recent Labs  Lab 06/19/21 0711 06/20/21 0711 06/21/21 0334 06/22/21 0312  PROCALCITON  --  21.03 <0.10 <0.10  WBC 6.8 5.3   5.1 5.6 5.7    Liver Function Tests: Recent Labs  Lab 06/16/21 0338  AST 26  ALT 16  ALKPHOS 62  BILITOT 0.9  PROT 5.5*  ALBUMIN 1.5*   No results for input(s): LIPASE, AMYLASE in the last 168 hours. No results for input(s): AMMONIA in the last 168 hours.  ABG    Component Value Date/Time   PHART 7.410 06/21/2021 0754   PCO2ART 73.9 (HH) 06/21/2021 0754   PO2ART 76.8 (L) 06/21/2021 0754   HCO3 46.2 (H) 06/21/2021 0754   TCO2 43 (H) 06/10/2021 0905   O2SAT 96.6 06/21/2021 0754     Coagulation Profile: Recent Labs  Lab 06/18/21 0211 06/19/21 0711 06/20/21 0711 06/21/21 0334 06/22/21 0312  INR 2.1* 1.5* 1.5* 1.5* 1.8*    Cardiac Enzymes: No results for input(s): CKTOTAL, CKMB, CKMBINDEX, TROPONINI in the last 168 hours.  HbA1C: Hgb A1c MFr Bld  Date/Time Value Ref Range Status  06/17/2021 02:40 AM 6.4 (H) 4.8 - 5.6 % Final    Comment:    (NOTE) Pre diabetes:          5.7%-6.4%  Diabetes:              >6.4%  Glycemic control for   <7.0% adults with diabetes     CBG: Recent Labs  Lab 06/21/21 1025 06/21/21 1556 06/21/21 2051 06/22/21 0611 06/22/21 1220  GLUCAP 170* 114* 156* 114* 114*    Sherrilyn Rist, MD [place] [facility] Pager: See Shea Evans

## 2021-06-22 NOTE — Progress Notes (Addendum)
ANTICOAGULATION CONSULT NOTE  Pharmacy Consult for warfarin to apixaban Indication: pulmonary embolus and hx DVT/PE  Allergies  Allergen Reactions   Azithromycin Nausea And Vomiting   Levofloxacin Diarrhea    Patient Measurements: Height: 6\' 3"  (190.5 cm) Weight: 120.8 kg (266 lb 5.1 oz) IBW/kg (Calculated) : 84.5 Heparin Dosing Weight: 106.6 kg  Vital Signs: Temp: 99 F (37.2 C) (12/21 0832) Temp Source: Oral (12/21 0300) BP: 113/53 (12/21 0832) Pulse Rate: 103 (12/21 0832)  Labs: Recent Labs    06/20/21 0711 06/21/21 0334 06/22/21 0312  HGB 8.8*   8.5* 8.4* 8.1*  HCT 31.1*   31.0* 29.4* 29.2*  PLT 214   209 199 188  LABPROT 18.0* 18.1* 20.7*  INR 1.5* 1.5* 1.8*  CREATININE 0.61 0.58* 0.71     Estimated Creatinine Clearance: 94.5 mL/min (by C-G formula based on SCr of 0.71 mg/dL).  Assessment: 87 yo male with metastatic prostate cancer receiving radiation and on chronic warfarin therapy for history of DVT and PE who was admitted post fall with new small SDH. INR of 8.8 on admission 12/08, Vitamin K 5mg  IV given x1. INR trended down to 1.5. CT showed multiple small pulmonary embolisms. CCM made decision to continue with warfarin - consulted Pharmacy to resume 12/11.   PTA warfarin dose: 5mg  MWF and 2.5mg  all other days.   Patient has now been subtherapeutic x 3 days.  MD has requested bridge with Lovenox until INR therapeutic.    FYI, Pharmacist spoke with patient and wife about changing to apixaban on 12/19. Provided wife education sheet and copay information - she is not interested in changing right now but will consider. Copay $47/month.  Goal of Therapy:  INR 2-3 Monitor platelets by anticoagulation protocol: Yes   Plan:  Start Lovenox 120mg  sq q12h Repeat warfarin 4 mg PO today -> INR slowly trending up  Continue to monitor daily INR, H&H and platelets Monitor closely for s/sx bleeding  Thank you Anette Guarneri, PharmD 06/22/2021 10:14 AM  11:20 am -  Change to Apixaban - starting tonight  **Pharmacist phone directory can be found on [email or web address] listed under [place]**

## 2021-06-22 NOTE — Progress Notes (Addendum)
Inpatient Rehabilitation Admissions Coordinator   Adrian Foster has denied CIR admit. I met with patient and his wife at bedside to discuss. She wishes to discuss with her family and will let me know today whether she would like to appeal that decision or pursue SNF. I have alerted acute team and TOC.  Danne Baxter, RN, MSN Rehab Admissions Coordinator [phone] 06/22/2021 10:28 AM  Wife has contacted me that she and family would like to pursue SNF at this time . I have informed acute team and TOC. We will sign off at this time.  Danne Baxter, RN, MSN Rehab Admissions Coordinator [phone] 06/22/2021 11:02 AM

## 2021-06-22 NOTE — NC FL2 (Signed)
[place] LEVEL OF CARE SCREENING TOOL     IDENTIFICATION  Patient Name: Adrian Foster Birthdate: Jun 20, 1934 Sex: male Admission Date (Current Location): 06/13/2021  [facility] and [place] Number:  [employer] and Address:  [facility] [place], [place] [address] [place], [place] [address]      Provider Number: [id number]  Attending Physician Name and Address:  Terrilee Croak, MD  Relative Name and Phone Number:       Current Level of Care: Hospital Recommended Level of Care: [place] Prior Approval Number:    Date Approved/Denied:   PASRR Number: [id number]  Discharge Plan: SNF    Current Diagnoses: Patient Active Problem List   Diagnosis Date Noted   ARDS (adult respiratory distress syndrome) ([place])    Acute respiratory failure with hypoxia ([place])    Protein-calorie malnutrition, severe 06/10/2021   Subdural hematoma 06/27/2021    Orientation RESPIRATION BLADDER Height & Weight     Self, Situation, Time, Place  O2, Other (Comment) (bipap at night; see DC summary for [place]) Continent Weight: 266 lb 5.1 oz (120.8 kg) Height:  6\' 3"  (190.5 cm)  BEHAVIORAL SYMPTOMS/MOOD NEUROLOGICAL BOWEL NUTRITION STATUS    Convulsions/Seizures Continent Diet (regular)  AMBULATORY STATUS COMMUNICATION OF NEEDS Skin   Extensive Assist Verbally Normal                       Personal Care Assistance Level of Assistance  Bathing, Feeding, Dressing Bathing Assistance: Maximum assistance Feeding assistance: Independent Dressing Assistance: Maximum assistance     Functional Limitations Info             SPECIAL CARE FACTORS FREQUENCY  PT (By licensed PT), OT (By licensed OT)     PT Frequency: 5x/wk OT Frequency: 5x/wk            Contractures Contractures Info: Not present    Additional Factors Info  Code Status, Allergies, Insulin Sliding Scale Code Status Info: Full Allergies Info: Azithromycin, Levofloxacin    Insulin Sliding Scale Info: see DC summary       Current Medications (06/22/2021):  This is the current hospital active medication list Current Facility-Administered Medications  Medication Dose Route Frequency Provider Last Rate Last Admin   0.9 %  sodium chloride infusion   Intravenous PRN Minor, Grace Bushy, NP 10 mL/hr at 06/16/21 1135 New Bag at 06/16/21 1135   acetaminophen (TYLENOL) tablet 650 mg  650 mg Oral Q6H PRN Etta Quill, DO   650 mg at 06/21/21 0841   albuterol (PROVENTIL) (2.5 MG/3ML) 0.083% nebulizer solution 2.5 mg  2.5 mg Nebulization Q4H PRN Juanito Doom, MD       apixaban (ELIQUIS) tablet 5 mg  5 mg Oral BID Rozann Lesches, [employer]       aspirin EC tablet 81 mg  81 mg Oral Daily Minor, Grace Bushy, NP   81 mg at 06/22/21 1140   bisacodyl (DULCOLAX) suppository 10 mg  10 mg Rectal Q0600 Candee Furbish, MD   10 mg at 06/22/21 0525   docusate sodium (COLACE) capsule 100 mg  100 mg Oral BID Minor, Grace Bushy, NP   100 mg at 06/22/21 1140   guaiFENesin (MUCINEX) 12 hr tablet 1,200 mg  1,200 mg Oral BID Simonne Maffucci B, MD   1,200 mg at 06/22/21 1141   haloperidol lactate (HALDOL) injection 2 mg  2 mg Intravenous Q6H PRN Terrilee Croak, MD  insulin aspart (novoLOG) injection 0-5 Units  0-5 Units Subcutaneous QHS Dahal, Marlowe Aschoff, MD   2 Units at 06/16/21 2100   insulin aspart (novoLOG) injection 0-9 Units  0-9 Units Subcutaneous TID WC Dahal, Marlowe Aschoff, MD   2 Units at 06/21/21 1128   ipratropium-albuterol (DUONEB) 0.5-2.5 (3) MG/3ML nebulizer solution 3 mL  3 mL Nebulization Q6H McQuaid, Nathaneil Canary B, MD   3 mL at 06/22/21 0759   lacosamide (VIMPAT) tablet 50 mg  50 mg Oral BID Terrilee Croak, MD   50 mg at 06/22/21 1141   multivitamin with minerals tablet 1 tablet  1 tablet Oral Daily Minor, Grace Bushy, NP   1 tablet at 06/22/21 1140   pantoprazole (PROTONIX) EC tablet 40 mg  40 mg Oral Daily Minor, Grace Bushy, NP   40 mg at 06/22/21 1140   piperacillin-tazobactam (ZOSYN) IVPB  3.375 g  3.375 g Intravenous Q8H Dahal, Binaya, MD 12.5 mL/hr at 06/22/21 0529 3.375 g at 06/22/21 0529   polyethylene glycol (MIRALAX / GLYCOLAX) packet 17 g  17 g Oral Daily Minor, Grace Bushy, NP   17 g at 06/22/21 1141   sodium chloride HYPERTONIC 3 % nebulizer solution 4 mL  4 mL Nebulization BID Simonne Maffucci B, MD   4 mL at 06/22/21 1219     Discharge Medications: Please see discharge summary for a list of discharge medications.  Relevant Imaging Results:  Relevant Lab Results:   Additional Information SS#: [id number]  Geralynn Ochs, LCSW

## 2021-06-23 ENCOUNTER — Inpatient Hospital Stay (HOSPITAL_COMMUNITY): Payer: Medicare Other

## 2021-06-23 LAB — CBC WITH DIFFERENTIAL/PLATELET
Abs Immature Granulocytes: 0.06 10*3/uL (ref 0.00–0.07)
Basophils Absolute: 0 10*3/uL (ref 0.0–0.1)
Basophils Relative: 0 %
Eosinophils Absolute: 0 10*3/uL (ref 0.0–0.5)
Eosinophils Relative: 0 %
HCT: 35.2 % — ABNORMAL LOW (ref 39.0–52.0)
Hemoglobin: 9.7 g/dL — ABNORMAL LOW (ref 13.0–17.0)
Immature Granulocytes: 1 %
Lymphocytes Relative: 5 %
Lymphs Abs: 0.4 10*3/uL — ABNORMAL LOW (ref 0.7–4.0)
MCH: 29 pg (ref 26.0–34.0)
MCHC: 27.6 g/dL — ABNORMAL LOW (ref 30.0–36.0)
MCV: 105.4 fL — ABNORMAL HIGH (ref 80.0–100.0)
Monocytes Absolute: 0.4 10*3/uL (ref 0.1–1.0)
Monocytes Relative: 4 %
Neutro Abs: 7.3 10*3/uL (ref 1.7–7.7)
Neutrophils Relative %: 90 %
Platelets: 231 10*3/uL (ref 150–400)
RBC: 3.34 MIL/uL — ABNORMAL LOW (ref 4.22–5.81)
RDW: 16.3 % — ABNORMAL HIGH (ref 11.5–15.5)
WBC: 8.2 10*3/uL (ref 4.0–10.5)
nRBC: 0 % (ref 0.0–0.2)

## 2021-06-23 LAB — BLOOD GAS, ARTERIAL
Acid-Base Excess: 18.1 mmol/L — ABNORMAL HIGH (ref 0.0–2.0)
Bicarbonate: 47.3 mmol/L — ABNORMAL HIGH (ref 20.0–28.0)
Drawn by: 51147
FIO2: 80
O2 Saturation: 87.9 %
Patient temperature: 36.6
pCO2 arterial: >120 mmHg (ref 32.0–48.0)
pH, Arterial: 7.148 — CL (ref 7.350–7.450)

## 2021-06-23 LAB — BASIC METABOLIC PANEL
Anion gap: 2 — ABNORMAL LOW (ref 5–15)
BUN: 22 mg/dL (ref 8–23)
CO2: 45 mmol/L — ABNORMAL HIGH (ref 22–32)
Calcium: 9.1 mg/dL (ref 8.9–10.3)
Chloride: 97 mmol/L — ABNORMAL LOW (ref 98–111)
Creatinine, Ser: 0.72 mg/dL (ref 0.61–1.24)
GFR, Estimated: >60 mL/min (ref >60)
Glucose, Bld: 144 mg/dL — ABNORMAL HIGH (ref 70–99)
Potassium: 4.1 mmol/L (ref 3.5–5.1)
Sodium: 144 mmol/L (ref 135–145)

## 2021-06-23 LAB — GLUCOSE, CAPILLARY
Glucose-Capillary: 183 mg/dL — ABNORMAL HIGH (ref 70–99)
Glucose-Capillary: 134 mg/dL — ABNORMAL HIGH (ref 70–99)
Glucose-Capillary: 122 mg/dL — ABNORMAL HIGH (ref 70–99)

## 2021-06-23 IMAGING — DX DG CHEST 1V PORT
1 series · 1 of 1 positions shown · non-contrast
Comparison: [DATE].

CLINICAL DATA: Respiratory distress.

EXAM:
PORTABLE CHEST 1 VIEW

[chest]
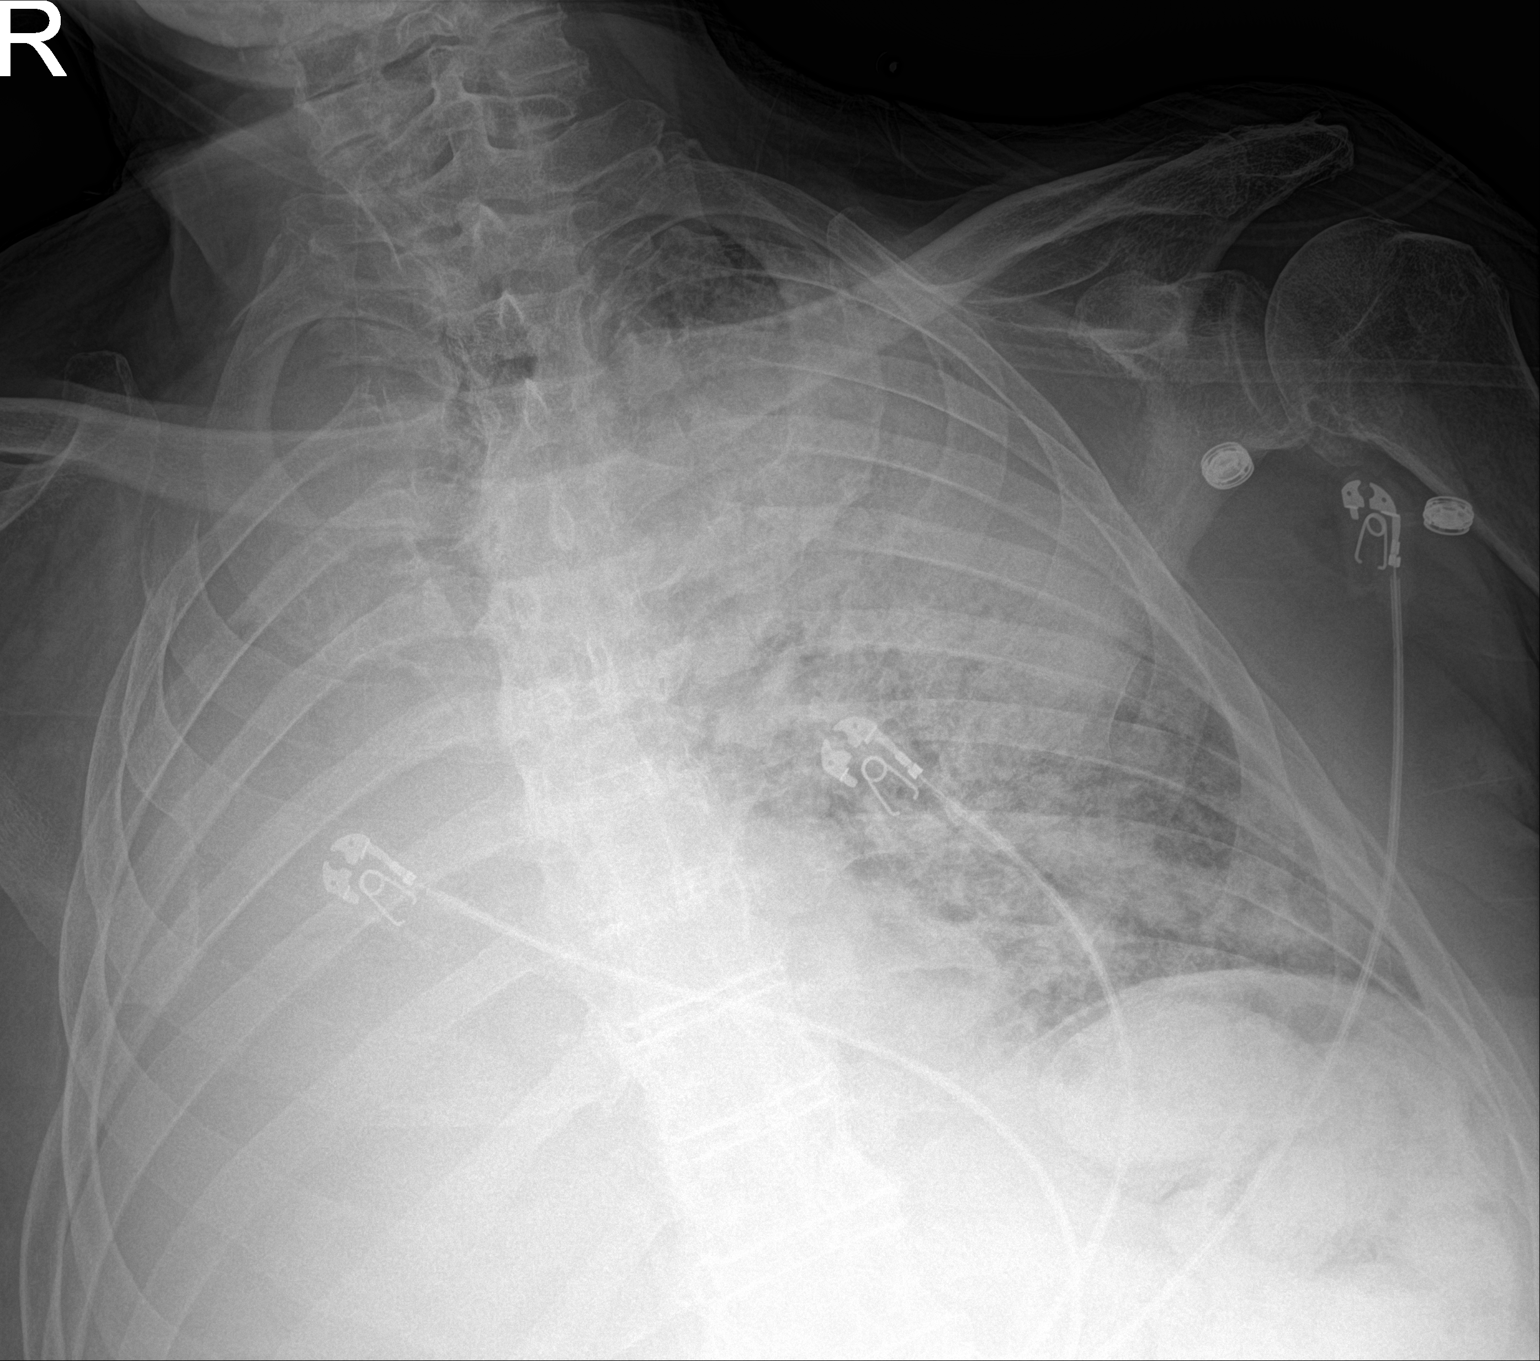

[1 of 1 positions shown; findings below may reference images not displayed]

FINDINGS: The cardiac borders are obscured. There is complete opacification of
the right lung. Patchy airspace disease is noted in the left lung.
There is consolidation in the left upper lobe. No pneumothorax is
seen. The bony structures are stable.
IMPRESSION: 1. Complete opacification of the right lung, possible
moderate-to-large pleural effusion with atelectasis or infiltrate.
2. Patchy airspace disease in the left lung with consolidation in
the left upper lobe, significantly worsened from the prior exam.

## 2021-06-23 MED ORDER — LORAZEPAM 1 MG PO TABS: 1.0000 mg | ORAL_TABLET | ORAL | Status: DC | PRN

## 2021-06-23 MED ORDER — MORPHINE SULFATE (PF) 2 MG/ML IV SOLN
1.0000 mg | INTRAVENOUS | Status: DC | PRN
  Administered 2021-06-23: 15:00:00 1 mg via INTRAVENOUS
  Filled 2021-06-23: qty 1

## 2021-06-23 MED ORDER — GLYCOPYRROLATE 0.2 MG/ML IJ SOLN
0.2000 mg | INTRAMUSCULAR | Status: DC | PRN
  Administered 2021-06-23: 15:00:00 0.2 mg via INTRAVENOUS
  Filled 2021-06-23: qty 1

## 2021-06-23 MED ORDER — GLYCOPYRROLATE 1 MG PO TABS
1.0000 mg | ORAL_TABLET | ORAL | Status: DC | PRN
  Filled 2021-06-23: qty 1

## 2021-06-23 MED ORDER — GLYCOPYRROLATE 0.2 MG/ML IJ SOLN: 0.2000 mg | INTRAMUSCULAR | Status: DC | PRN

## 2021-06-23 MED ORDER — LORAZEPAM 2 MG/ML IJ SOLN
1.0000 mg | INTRAMUSCULAR | Status: DC | PRN
  Filled 2021-06-23: qty 1

## 2021-06-23 MED ORDER — LORAZEPAM 2 MG/ML PO CONC: 1.0000 mg | ORAL | Status: DC | PRN

## 2021-07-03 NOTE — Progress Notes (Signed)
RN pronounced death at 78 with primary RN, Stanton Kidney. MD paged by primary nurse . Family at bedside.

## 2021-07-03 NOTE — Progress Notes (Addendum)
°   2021/07/03 0955  Clinical Encounter Type  Visited With Patient and family together  Visit Type Follow-up;Psychological support;Social support  Referral From [place]   [place] visited pt. per referral from [facility].  Pt. lying in bed wearing BiPap mask and able to respond to verbal stimuli though seeming drowsy.  Pt.'s two daughters, grandson, and wife all at bedside, appropriately tearful.  Family taking turns speaking w/pt. and spending time together at bedside; they await the arrival of and additional son from [place].  [place] requested bereavement cart to be brought for family via [employer].  Lindaann Pascal, Chaplain Pager: [phone]

## 2021-07-03 NOTE — Significant Event (Signed)
Pt seen and evaluated at bedside.  Throughout evening pt with increasing O2 requirement to maintain sats.  Ultimately ended up on NRB per RT after desating to the 60s.  Pt now with lethargy due to CO2 retention, PCO2 > 120, pH 7.1.  Pt started on BIPAP which he had been refusing earlier in the evening (reason it wasn't started earlier).  But pt is obtunded on BIPAP.  PCCM Dr. Celesta Aver seeing pt at bedside in consult.  Having long discussion with family given pts underlying issues including recent stroke, aspiration syndrome, lung CA, PEs, etc.  For the moment PCCM recs leaving pt on BIPAP despite AMS while family member calls other family and discusses goals of care, do they really want him intubated again given underlying medical conditions? Etc.  Still a 'full code' for the moment.

## 2021-07-03 NOTE — Progress Notes (Signed)
Events noted Progressive decline, hypercapnic respiratory failure  Will see as needed  Patient is DNR, DNI Will reengage as needed  Discussed with Dr. Pietro Cassis this morning

## 2021-07-03 NOTE — Progress Notes (Signed)
PT Cancellation Note  Patient Details Name: Adrian Foster MRN: [id number] DOB: 05/14/1934   Cancelled Treatment:    Reason Eval/Treat Not Completed: Medical issues which prohibited therapy. Overnight events noted. Pt is currently on bipap. PT to follow and resume intervention if/when appropriate.   Lorriane Shire 2021-07-11, 12:17 PM  Lorrin Goodell, PT  Office # [phone] Pager [phone]

## 2021-07-03 NOTE — Progress Notes (Signed)
Chaplain responded to nurse request for support of pt's wife who had just made the decision to move her husband to comfort care.  Wife wanted to debrief with Chaplain, share her pain and confirm her decision prior to her daughters getting here.  Chaplain waited with wife until daughters arrived,  Chaplain available for additional support and will refer to day Chaplains.  [place]

## 2021-07-03 NOTE — Progress Notes (Signed)
CPT via chest vest initiated by RT per MD.

## 2021-07-03 NOTE — Significant Event (Signed)
Met with family member again this afternoon. Ghimire decision to remove patient's BiPAP as he was fighting with it.  Patient is currently in 100% on breather.  Tries to open eyes on verbal command and nods head to answer simple questions but unable to have any other conversation. He clarifies that he wants to be comfortable. Family is waiting for more members together. Once that happens, they want to transition him to comfort care.  I went through the details of comfort care.  Patient would be started on IV morphine as needed, Robinul as needed, his oxygen supplementation will be switched down to 2 L by nasal cannula. Family agrees but as above, waiting for more family members to visit him at this time. Communicated with RN Melissa.

## 2021-07-03 NOTE — Progress Notes (Signed)
Pt's wife came to the desk at [facility] requesting to speak with the ICU Doctors, Dr Alcario Drought paged who said to page the PCCU Doctors,  [place] paged at [phone], they said that there is no consult in the pt's chart, This RN advised pt's RN to pass the information to the on coming day shift RN. [place], [place] Vosler Efe

## 2021-07-03 NOTE — Progress Notes (Signed)
Critical ABG result given to MD.  MD aware and at bedside.

## 2021-07-03 NOTE — Progress Notes (Signed)
°   June 26, 2021 0400  Assess: MEWS Score  BP (!) 112/52  Pulse Rate 91  ECG Heart Rate 92  Resp 19  Level of Consciousness Unresponsive  SpO2 95 %  Assess: MEWS Score  MEWS Temp 0  MEWS Systolic 0  MEWS Pulse 0  MEWS RR 0  MEWS LOC 3  MEWS Score 3  MEWS Score Color Yellow  Assess: if the MEWS score is Yellow or Red  Were vital signs taken at a resting state? Yes  Focused Assessment Change from prior assessment (see assessment flowsheet)  Early Detection of Sepsis Score *See Row Information* High  Take Vital Signs  Increase Vital Sign Frequency  Yellow: Q 2hr X 2 then Q 4hr X 2, if remains yellow, continue Q 4hrs;Red: Q 1hr X 4 then Q 4hr X 4, if remains red, continue Q 4hrs  Notify: Charge Nurse/RN  Name of Charge Nurse/RN Notified Phil  Date Charge Nurse/RN Notified 2021/06/26  Time Charge Nurse/RN Notified 0400  Notify: Provider  Provider Name/Title Alcario Drought  Date Provider Notified 06-26-2021  Time Provider Notified [phone]  Notification Type Page  Notification Reason Change in status;Critical result  Provider response At bedside  Date of Provider Response 06/26/2021  Time of Provider Response 0405  Document  Patient Outcome Not stable and remains on department  Progress note created (see row info) Yes

## 2021-07-03 NOTE — Progress Notes (Signed)
RN pronounced death at 31 with second RN, Samer Harbawi. MD paged. Family at bedside.

## 2021-07-03 NOTE — Progress Notes (Signed)
NAME:  Adrian Foster, MRN:  [id number], DOB:  12/31/1933, LOS: 21 ADMISSION DATE:  06/14/2021, CONSULTATION DATE:  06/13/2021 REFERRING MD:  Dr. Maryland Pink, CHIEF COMPLAINT:  Pleural Effusion   History of Present Illness:  88 year old male presents to [facility] on 12/8 after mechanical fall the night prior on coumadin oncologist recommended he come to ED.   On arrival to [facility] ED on 12/8, patient was found to be hypoxic in 70s on room air. Sats improved with supplemental O2. Patient has been taking Coumadin for subsegmental PE outpatient. INR was 8.8. CT chest shows PE within segmental and subsegmental branches on RLL. CT head showed 5 mm parafalcine subdural hematoma. Neurosurgery consulted and recommend no surgery and recommend not severe enough to reverse anticoagulation. Vitamin K given to improve INR to allow [place] to start patient on heparin drip. CXR shows possible RLL pneumonia; given ceftriaxone/azithromycin. 12/9 intubated for encephalopathy and hypoxemic/hypercapnic respiratory failure. Extubated 12/10.  Patient transferred out of ICU 12/11. 12/12 pulmonary consulted for evaluation of worsening right hemithorax with near complete opacification.   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 Baird Cancer at [facility] > treated with radiation in 01/2021  Significant Hospital Events: Including procedures, antibiotic start and stop dates in addition to other pertinent events   12/8: admitted to [facility] on 12/8 for hypoxia 12/9 early morning intubated for encephalopathy and hypoxemic/hypercapnic respiratory failure 12/10 extubated  12/13 started on bipap for hypercapnia 12/19 worsening CXR, started back on hypertonic saline and metaneb 12/21 unresponsive with pH 7.1 and pCO2 102  Interim History / Subjective:   PCCM called overnight for CO2 narcosis, unresponsive after  the patient declined BiPAP.   Objective   Blood pressure (!) 98/42, pulse 85, temperature 98 F (36.7 C), temperature source Axillary, resp. rate 18, height 6\' 3"  (1.905 m), weight 120.8 kg, SpO2 90 %.    FiO2 (%):  [50 %-55 %] 55 %   Intake/Output Summary (Last 24 hours) at 07-17-21 0505 Last data filed at 06/22/2021 2051 Gross per 24 hour  Intake 240 ml  Output 700 ml  Net -460 ml    Filed Weights   06/18/21 0500 06/20/21 0500 06/21/21 0500  Weight: 120.2 kg 120.9 kg 120.8 kg    Examination: General: Elderly male on BiPAP HENT: [place]/AT PULM: diminished on the R. Coarse L.  CV: RRR, no MRG GI: Soft, non-distended.  MSK: normal bulk and tone Neuro: No verbal response or eye opening. Does localize with R arm to sternal rub.   ABG    Component Value Date/Time   PHART 7.148 (LL) 17-Jul-2021 0335   PCO2ART >120 (HH) Jul 17, 2021 0335   PO2ART NOT CALCULATED 07/17/21 0335   HCO3 47.3 (H) Jul 17, 2021 0335   TCO2 43 (H) 06/10/2021 0905   O2SAT 87.9 2021-07-17 0335    Resolved Hospital Problem list     Assessment & Plan:   Acute hypoxemic/hypercarbic respiratory failure in the setting of Pseudomonas pneumonia Right lower lobe pulmonary embolism Stage IIb squamous cell lung cancer treated with radiation therapy 01/2021 Bronchiectasis on right lower lobe aspiration at baseline  CXR reviewed > now with complete opacification of R lung.   - Continue BiPAP, settings adjusted.  - Meets criteria for intubation. The patient's wife is bedside and is uncertain aggressive measures are the most prudent course. She is contacting her children and  will relay plan to [place]. She is leaning towards not intubating.  - Chest PT with vest - Repeat CXR and ABG at 0800.  -14 days of Zosyn recommended - Aspiration precautions  Subdural hematoma on warfarin - per primary  Will continue to follow  Best Practice (right click and "Reselect all SmartList Selections" daily)   Remaining  management per primary team.   Labs   CBC: Recent Labs  Lab 06/19/21 0711 06/20/21 0711 06/21/21 0334 06/22/21 0312 2021/06/24 0229  WBC 6.8 5.3   5.1 5.6 5.7 8.2  NEUTROABS  --  4.6 4.9 5.0 7.3  HGB 9.1* 8.8*   8.5* 8.4* 8.1* 9.7*  HCT 32.7* 31.1*   31.0* 29.4* 29.2* 35.2*  MCV 103.5* 105.4*   105.8* 103.2* 102.8* 105.4*  PLT 208 214   209 199 188 231     Basic Metabolic Panel: Recent Labs  Lab 06/19/21 0711 06/20/21 0711 06/21/21 0334 06/22/21 0312 06-24-2021 0229  NA 144 145 144 144 144  K 3.9 3.9 3.3* 3.4* 4.1  CL 98 97* 94* 97* 97*  CO2 42* >45* >45* 44* 45*  GLUCOSE 107* 106* 130* 120* 144*  BUN 21 24* 22 23 22   CREATININE 0.61 0.61 0.58* 0.71 0.72  CALCIUM 9.2 9.0 8.9 8.8* 9.1    GFR: Estimated Creatinine Clearance: 94.5 mL/min (by C-G formula based on SCr of 0.72 mg/dL). Recent Labs  Lab 06/20/21 0711 06/21/21 0334 06/22/21 0312 06/24/21 0229  PROCALCITON 21.03 <0.10 <0.10  --   WBC 5.3   5.1 5.6 5.7 8.2     Liver Function Tests: No results for input(s): AST, ALT, ALKPHOS, BILITOT, PROT, ALBUMIN in the last 168 hours.  No results for input(s): LIPASE, AMYLASE in the last 168 hours. No results for input(s): AMMONIA in the last 168 hours.  ABG    Component Value Date/Time   PHART 7.148 (LL) 2021-06-24 0335   PCO2ART >120 (HH) June 24, 2021 0335   PO2ART NOT CALCULATED 06-24-2021 0335   HCO3 47.3 (H) 24-Jun-2021 0335   TCO2 43 (H) 06/10/2021 0905   O2SAT 87.9 06-24-2021 0335      Coagulation Profile: Recent Labs  Lab 06/18/21 0211 06/19/21 0711 06/20/21 0711 06/21/21 0334 06/22/21 0312  INR 2.1* 1.5* 1.5* 1.5* 1.8*     Cardiac Enzymes: No results for input(s): CKTOTAL, CKMB, CKMBINDEX, TROPONINI in the last 168 hours.  HbA1C: Hgb A1c MFr Bld  Date/Time Value Ref Range Status  06/17/2021 02:40 AM 6.4 (H) 4.8 - 5.6 % Final    Comment:    (NOTE) Pre diabetes:          5.7%-6.4%  Diabetes:              >6.4%  Glycemic control for    <7.0% adults with diabetes     CBG: Recent Labs  Lab 06/21/21 2051 06/22/21 0611 06/22/21 1220 06/22/21 1518 06/22/21 2048  GLUCAP 156* 114* 114* 115* 121*     Georgann Housekeeper, AGACNP-BC [facility]  See Amion for personal pager PCCM on call pager [phone] until 7pm. Please call Elink 7p-7a. [phone]  Jun 24, 2021 5:12 AM

## 2021-07-03 NOTE — Progress Notes (Signed)
PROGRESS NOTE  Adrian Foster  DOB: 02-11-1934  PCP: Charleston Poot, MD WUJ:[id number]  DOA: 06/07/2021  LOS: 14 days  Hospital Day: 39  Chief Complaint  Patient presents with   Fall    Brief narrative: Adrian Foster is a 88 y.o. male with PMH significant for DM2, HTN, CAD/MI, DVT/PE on Coumadin, metastatic prostate cancer with mets to lungs on active radiation. Patient was brought to the ED on 12/8 after a mechanical fall while on Coumadin..  In the ED, patient was found to be hypoxic in 70s on room air and required supplemental oxygen. INR was elevated to 8.8 CT chest showed PE within segmental and subsegmental branches on RLL.  CT head showed 5 mm parafalcine subdural hematoma.  Admitted to hospital service Neurosurgery was consulted, did not recommend surgical intervention orders reversal of anticoagulation.  Patient was given vitamin K and also subsequently started on heparin drip  CXR showed possible RLL pneumonia; started on ceftriaxone/azithromycin.  12/9, patient was intubated for encephalopathy and hypoxemic/hypercapnic respiratory failure. 12/10, extubated  12/11, transferred out of ICU  12/12, pulmonary reconsulted for evaluation of worsening right hemithorax with near complete opacification.  12/13, started on BiPAP for hypercapnia.  Is hospitalized and has been complicated by recurrent respiratory failure requiring nightly BiPAP.  He has also been getting as needed BiPAP during the day. His respiratory failure seems to be gradually worsening with further elevated PCO2 leading to mental status changes as well.   Subjective: Patient was seen and examined this morning. Event from last night noted.  Patient was hypoxic, hypercapnic with PCO2 more than 120. He was placed on BiPAP.  Family gathered Admitted family this morning.  He is DNR/DNI.  See in separate note.  Assessment/Plan: Acute hypoxic/hypercarbic respiratory failure -Likely secondary to pneumonia.  Also  contributed by PE and lung mets.   -Was on mechanical ventilation from 12/9-12/10 and was successfully extubated. However on 12/13, patient was started on BiPAP again for worsening hypoxia.  He has been on BiPAP nightly and as needed since then. -PCO2 more than 120 last night.  Has been on BiPAP since then. -Previously be having worsening respiratory failure. -Remains on a course of antibiotic till 12/25. -Continue incentive spirometry, flutter valve -Per my discussion with family this morning, he is DNR/DNI.  Aspiration pneumonia Pseudomonas in tracheal aspirate -Chest x-ray repeated on 12/19 worsening right-sided infiltrates.  Patient probably is chronically aspirating.  Speech therapy to follow  -Currently on IV Zosyn, antibiotics planned till 12/25..  I would continue IV while he is in the hospital  Segmental right lower lobe pulmonary embolism Previous history of DVT and PE Supratherapeutic INR -Patient has history of DVT PE in the past as well and was chronically on Coumadin.  -Patient presented with INR elevated to 8.8 and at the same time had bilateral PE .  -He initially required anticoagulation reversal after which INR trended down.  -His INR has been fluctuating wildly.  I discussed with patient and his wife.  On 12/21, they were agreeable and hence patient was switched to Eliquis.  Acute metabolic encephalopathy -In the setting of hypoxemia, hypercapnia, subdural hematoma, prolonged hospitalization -Altered mental status this morning because of hypercapnia.   Subdural hematoma -Patient was noted on CT head on admission.  Small in size.  No intervention per neurosurgery.  Neurology has cleared him to take anticoagulation.   -Per neurology note and recommendation from 12/12, patient has been on Vimpat.  He will continue it for 2 to 3  months and follow-up with neurology.  Can be considered for weaning off antiseizure medication at that time.   -Continue seizure precautions.     History of CAD/MI -PTA on aspirin, statin  Type 2 diabetes mellitus -Was on metformin prior to presentation -Currently on sliding scale insulin with Accu-Cheks. Recent Labs  Lab 06/22/21 1220 06/22/21 1518 06/22/21 2048 06/25/2021 0618 06-25-21 1119  GLUCAP 114* 115* 121* 134* 122*    Essential hypertension -PTA on torsemide and metolazone.  Both of them are currently on hold.  Blood pressure remains on lower side of normal. Continue to monitor. -Hypotensive this morning to 80s.   History of metastatic prostate cancer with mets to lungs -Gets treatment at [facility] under the care of Dr. Baird Cancer.  Had been getting radiation treatments.     Indeterminate right renal lesion -Will need outpatient MRI for same.   Severe protein calorie malnutrition -Dietitian consulted.  Supplements ordered  Goals of care -He has been progressively declining last few days.  He is having frequent aspiration events and recurrent respiratory failure.  Guarded prognosis.  Discussed with family at bedside.  DNR/DNI.  I have also approached the idea of comfort care to the family.  Pending decision by family.  Mobility: Encourage ambulation Living condition: Lives at home Nutritional status: Body mass index is 33.15 kg/m.  Nutrition Problem: Severe Malnutrition Etiology: chronic illness, cancer and cancer related treatments Signs/Symptoms: severe muscle depletion, severe fat depletion Diet:  Diet Order             Diet regular Room service appropriate? Yes with Assist; Fluid consistency: Nectar Thick  Diet effective now                  DVT prophylaxis: Coumadin  apixaban (ELIQUIS) tablet 5 mg   Antimicrobials: IV Zosyn, extended till 12/25. Fluid: None Consultants: Neurology, pulmonology Family Communication: Wife and daughters at bedside  Status is: Inpatient  Continue in-hospital care because: Unstable respiratory status Level of care: Progressive   Dispo: The patient is  from: Home              Anticipated d/c is to: CIR denied by insurance.  Pending SNF.  Unstable to consider SNF today.              Patient currently is not medically stable to d/c.   Difficult to place patient No     Infusions:   sodium chloride 10 mL/hr at 06/16/21 1135   piperacillin-tazobactam (ZOSYN)  IV 3.375 g (2021-06-25 0512)    Scheduled Meds:  apixaban  5 mg Oral BID   aspirin EC  81 mg Oral Daily   bisacodyl  10 mg Rectal Q0600   docusate sodium  100 mg Oral BID   guaiFENesin  1,200 mg Oral BID   insulin aspart  0-5 Units Subcutaneous QHS   insulin aspart  0-9 Units Subcutaneous TID WC   lacosamide  50 mg Oral BID   multivitamin with minerals  1 tablet Oral Daily   pantoprazole  40 mg Oral Daily   polyethylene glycol  17 g Oral Daily   sodium chloride HYPERTONIC  4 mL Nebulization BID    PRN meds: sodium chloride, acetaminophen, albuterol, haloperidol lactate   Antimicrobials: Anti-infectives (From admission, onward)    Start     Dose/Rate Route Frequency Ordered Stop   06/14/21 2200  piperacillin-tazobactam (ZOSYN) IVPB 3.375 g        3.375 g 12.5 mL/hr over 240 Minutes Intravenous  Every 8 hours 06/14/21 1212 06/26/21 2159   06/14/21 1300  piperacillin-tazobactam (ZOSYN) IVPB 4.5 g        4.5 g 200 mL/hr over 30 Minutes Intravenous  Once 06/14/21 1212 06/14/21 1600   06/12/21 1200  ceFEPIme (MAXIPIME) 2 g in sodium chloride 0.9 % 100 mL IVPB  Status:  Discontinued        2 g 200 mL/hr over 30 Minutes Intravenous Every 8 hours 06/12/21 1113 06/14/21 1144   06/11/21 1200  cefTRIAXone (ROCEPHIN) 2 g in sodium chloride 0.9 % 100 mL IVPB  Status:  Discontinued        2 g 200 mL/hr over 30 Minutes Intravenous Every 24 hours 06/11/21 1025 06/12/21 1113   06/10/21 1600  cefTRIAXone (ROCEPHIN) 1 g in sodium chloride 0.9 % 100 mL IVPB  Status:  Discontinued        1 g 200 mL/hr over 30 Minutes Intravenous Every 24 hours 06/10/21 1025 06/11/21 1025   06/10/21 1600   azithromycin (ZITHROMAX) 500 mg in sodium chloride 0.9 % 250 mL IVPB  Status:  Discontinued        500 mg 250 mL/hr over 60 Minutes Intravenous Every 24 hours 06/10/21 1025 06/12/21 1113   06/07/2021 1615  cefTRIAXone (ROCEPHIN) 1 g in sodium chloride 0.9 % 100 mL IVPB        1 g 200 mL/hr over 30 Minutes Intravenous  Once 06/18/2021 1605 06/29/2021 1716   06/06/2021 1615  azithromycin (ZITHROMAX) 500 mg in sodium chloride 0.9 % 250 mL IVPB        500 mg 250 mL/hr over 60 Minutes Intravenous  Once 06/12/2021 1605 06/03/2021 1750       Objective: Vitals:   09-Jul-2021 0829 07/09/2021 1120  BP: 99/66 101/63  Pulse: 73 90  Resp: (!) 22 (!) 24  Temp: 97.6 F (36.4 C) 98.1 F (36.7 C)  SpO2:      Intake/Output Summary (Last 24 hours) at 09-Jul-2021 1137 Last data filed at 06/22/2021 2051 Gross per 24 hour  Intake --  Output 700 ml  Net -700 ml    Filed Weights   06/20/21 0500 06/21/21 0500 07-09-21 0500  Weight: 120.9 kg 120.8 kg 120.3 kg   Weight change:  Body mass index is 33.15 kg/m.   Physical Exam: General exam: Elderly Caucasian male.  Not in physical distress.  Altered on BiPAP Skin: No rashes, lesions or ulcers. HEENT: Atraumatic, normocephalic, no obvious bleeding Lungs: Clear to auscultation bilaterally CVS: Regular rate and rhythm, no murmur. GI/Abd soft, nontender, nondistended, bowel sound present CNS: Tries to open eyes on BiPAP. Psychiatry: Mood appropriate Extremities: No pedal edema, no calf tenderness  Data Review: I have personally reviewed the laboratory data and studies available.  F/u labs ordered Unresulted Labs (From admission, onward)    None       Signed, Terrilee Croak, MD [facility] 07/09/21

## 2021-07-03 NOTE — Death Summary Note (Signed)
DEATH SUMMARY   Patient Details  Name: Adrian Foster MRN: [id number] DOB: 1933/07/05  Admission/Discharge Information   Admit Date:  June 18, 2021  Date of Death: Date of Death: 07-02-2021  Time of Death: Time of Death: 09-18-1645  Length of Stay: 2022/09/23  Referring Physician: Charleston Poot, MD   Reason(s) for Hospitalization  Mechanical fall while on Coumadin.  Diagnoses  Preliminary cause of death:  Secondary Diagnoses (including complications and co-morbidities):  Principal Problem:   Subdural hematoma Active Problems:   Protein-calorie malnutrition, severe   Acute respiratory failure with hypoxia (HCC)   ARDS (adult respiratory distress syndrome) [facility])   Brief Hospital Course (including significant findings, care, treatment, and services provided and events leading to death)  Adrian Foster is a 88 y.o. year old male with PMH significant for DM2, HTN, CAD/MI, DVT/PE on Coumadin, metastatic prostate cancer with mets to lungs on active radiation. Patient was brought to the ED on June 19, 2023 after a mechanical fall while on Coumadin..   In the ED, patient was found to be hypoxic in 70s on room air and required supplemental oxygen. INR was elevated to 8.8 CT chest showed PE within segmental and subsegmental branches on RLL.  CT head showed 5 mm parafalcine subdural hematoma.  Admitted to hospital service Neurosurgery was consulted, did not recommend surgical intervention orders reversal of anticoagulation.  Patient was given vitamin K and also subsequently started on heparin drip  CXR showed possible RLL pneumonia; started on ceftriaxone/azithromycin.  12/9, patient was intubated for encephalopathy and hypoxemic/hypercapnic respiratory failure. 12/10, extubated  12/11, transferred out of ICU  12/12, pulmonary reconsulted for evaluation of worsening right hemithorax with near complete opacification.  12/13, started on BiPAP for hypercapnia.   Patient's hospital course was complicated by  recurrent respiratory failure requiring nightly BiPAP.  He also required as needed BiPAP during the day. His respiratory failure seems to be gradually worsening with further elevated PCO2 leading to mental status changes as well. In the morning of 2023-07-03, patient was noted to be hypoxic and also hypercapnic with CO2 more than 120.  He was placed on BiPAP. Family members convened and made a decision with patient to choose comfort care measures. He was switched to comfort care status on 07/03/2023.  He passed away at 16:47 PM on 02-Jul-2021 Cause of death: Recurrent aspiration pneumonia   Pertinent Labs and Studies  Significant Diagnostic Studies CT ANGIO HEAD NECK W WO CM  Result Date: 06/10/2021 CLINICAL DATA:  Follow-up examination for stroke. EXAM: CT ANGIOGRAPHY HEAD AND NECK TECHNIQUE: Multidetector CT imaging of the head and neck was performed using the standard protocol during bolus administration of intravenous contrast. Multiplanar CT image reconstructions and MIPs were obtained to evaluate the vascular anatomy. Carotid stenosis measurements (when applicable) are obtained utilizing NASCET criteria, using the distal internal carotid diameter as the denominator. CONTRAST:  16mL OMNIPAQUE IOHEXOL 350 MG/ML SOLN COMPARISON:  Prior CT from 2021/06/18. FINDINGS: CTA NECK FINDINGS Aortic arch: Visualized aortic arch normal in caliber with normal 3 vessel morphology. Moderate atheromatous change about the arch and origin the great vessels without hemodynamically significant stenosis. Right carotid system: Right CCA patent from its origin to the bifurcation without stenosis. Scattered calcified plaque about the right carotid bulb/proximal right ICA with associated stenosis of up to 40-50% by NASCET criteria. Right ICA patent distally to the skull base without stenosis or dissection. Left carotid system: Left CCA patent from its origin to the bifurcation without stenosis. Bulky calcified plaque about the left  carotid  bulb/proximal left ICA. There is a short-segment severe 90% stenosis involving the proximal left ICA (series 10, image 205). Left ICA patent distally without stenosis or dissection. Vertebral arteries: Both vertebral arteries arise from the subclavian arteries. No significant proximal subclavian artery stenosis. Left vertebral artery dominant. Atheromatous change at the origins of both vertebral arteries with associated severe ostial stenoses. Vertebral arteries otherwise patent within the neck without stenosis or dissection. Skeleton: Few scattered lucencies within the cervical spine felt to be degenerative in nature. No definite discrete lytic or blastic osseous lesions. Underlying mild for age spondylosis. Patient is largely edentulous. Other neck: No other acute soft tissue abnormality within the neck. No mass or adenopathy. Upper chest: Extensive multifocal bilateral airspace disease again seen, concerning for multifocal infection/pneumonia. Known right apical tumor invading the right second and third ribs again noted, stable. Review of the MIP images confirms the above findings CTA HEAD FINDINGS Anterior circulation: Petrous segments patent bilaterally. Atheromatous plaque throughout the carotid siphons with associated mild diffuse narrowing. A1 segments patent bilaterally. Normal anterior communicating artery complex. Anterior cerebral arteries patent to their distal aspects without significant stenosis. No M1 stenosis or occlusion. Normal MCA bifurcations. Distal MCA branches perfused and symmetric. Posterior circulation: Left vertebral artery dominant and widely patent to the vertebrobasilar junction. Left PICA patent. Hypoplastic right vertebral artery terminates in PICA. Right PICA patent as well. Basilar mildly tortuous but is widely patent to its distal aspect. Superior cerebral arteries patent bilaterally. Fetal type origin of the left PCA. Right PCA supplied via the basilar as well as a robust  right posterior communicating artery. Both PCAs well perfused to their distal aspects without stenosis. Venous sinuses: Patent. Anatomic variants: Fetal type origin of the right PCA. No aneurysm. Previously identified small 5 mm posterior parafalcine subdural hematoma again noted, grossly stable. Review of the MIP images confirms the above findings IMPRESSION: 1. Negative CTA for large vessel occlusion. 2. Bulky calcified plaque about the proximal left ICA with associated short-segment severe 90% stenosis. 3. Atheromatous plaque about the right carotid bulb/proximal right ICA with associated stenosis of up to 40-50% by NASCET criteria. 4. Severe ostial stenoses about the origins of both vertebral arteries. Left vertebral artery dominant. Right vertebral artery terminates in PICA. 5. Extensive multifocal bilateral airspace disease, consistent with multifocal infection/pneumonia. Known right apical mass with rib invasion, stable. 6. No significant interval change in small 5 mm posterior parafalcine subdural hematoma. Electronically Signed   By: Jeannine Boga M.D.   On: 06/10/2021 05:12   DG Chest 1 View  Result Date: 06/17/2021 CLINICAL DATA:  Shortness of breath. EXAM: CHEST  1 VIEW COMPARISON:  June 15, 2021. FINDINGS: Stable cardiomediastinal silhouette. Stable right basilar atelectasis or infiltrate is noted. Stable mild left upper and lower lobe opacities are noted. Stable right upper lobe opacity is noted. Bony thorax is unremarkable. IMPRESSION: Stable bilateral lung opacities. Electronically Signed   By: Marijo Conception M.D.   On: 06/17/2021 11:59   CT HEAD WO CONTRAST (5MM)  Result Date: 06/14/2021 CLINICAL DATA:  Mental status changes of unknown cause EXAM: CT HEAD WITHOUT CONTRAST TECHNIQUE: Contiguous axial images were obtained from the base of the skull through the vertex without intravenous contrast. COMPARISON:  MRI 06/10/2021.  CT studies 06/06/2021 and 06/10/2021. FINDINGS: Brain:  No change or acute finding. Generalized atrophy. Chronic small-vessel ischemic changes affecting the cerebral hemispheric white matter. No sign of acute infarction, mass lesion or hydrocephalus. Subdural hematoma along the right side of the posterior  falx and right posterior and anterior convexity is no larger and could be a mm smaller. No mass effect. Vascular: There is atherosclerotic calcification of the major vessels at the base of the brain. Skull: Negative Sinuses/Orbits: Clear/normal Other: None IMPRESSION: No change since the prior exam. Atrophy and chronic small-vessel ischemic changes. No evidence of acute infarction. Stable or slightly diminishing small subdural hematoma along the right side of the posterior falx and right posterior and anterior convexity, maximal thickness 3-4 mm. Electronically Signed   By: Nelson Chimes M.D.   On: 06/14/2021 12:58   CT HEAD WO CONTRAST (5MM)  Result Date: 06/30/2021 CLINICAL DATA:  Altered mental status, subdural hematoma, follow-up examination EXAM: CT HEAD WITHOUT CONTRAST TECHNIQUE: Contiguous axial images were obtained from the base of the skull through the vertex without intravenous contrast. COMPARISON:  3:07 p.m. FINDINGS: Brain: 5 mm posterior para falcine subdural hematoma is stable. No interval hemorrhage. No associated mass effect or midline shift. Mild parenchymal volume loss is commensurate with the patient's age. Mild periventricular white matter changes are present likely reflecting the sequela of small vessel ischemia. No abnormal intra or extra-axial mass lesion. Ventricular size is normal. Cerebellum is unremarkable. Vascular: No hyperdense vessel or unexpected calcification. Skull: Normal. Negative for fracture or focal lesion. Sinuses/Orbits: Small mucous retention cysts within the maxillary sinuses bilaterally. Paranasal sinuses are otherwise clear. The orbits are unremarkable. Other: Mastoid air cells and middle ear cavities are clear.  IMPRESSION: Stable 5 mm posterior para falcine subdural hematoma. No associated mass effect. No interval hemorrhage. Electronically Signed   By: Fidela Salisbury M.D.   On: 06/11/2021 21:20   CT Head Wo Contrast  Result Date: 06/15/2021 CLINICAL DATA:  Head trauma, moderate-severe.  Fall. EXAM: CT HEAD WITHOUT CONTRAST TECHNIQUE: Contiguous axial images were obtained from the base of the skull through the vertex without intravenous contrast. COMPARISON:  None. FINDINGS: Brain: There is a subdural hematoma along the mid to posterior falx measuring 5 mm in thickness. No mass effect or midline shift. There is atrophy and chronic small vessel disease changes. No acute infarction. No hydrocephalus. Vascular: No hyperdense vessel or unexpected calcification. Skull: No acute calvarial abnormality. Sinuses/Orbits: No acute findings Other: None IMPRESSION: 5 mm parafalcine subdural hematoma. Atrophy, chronic small vessel disease. Electronically Signed   By: Rolm Baptise M.D.   On: 06/26/2021 15:29   CT Angio Chest PE W and/or Wo Contrast  Result Date: 06/14/2021 CLINICAL DATA:  Lung cancer.  Prostate cancer.  Fall. EXAM: CT ANGIOGRAPHY CHEST CT ABDOMEN AND PELVIS WITH CONTRAST TECHNIQUE: He has been hiding her in the CT imaging of the chest was performed using the standard protocol during bolus administration of intravenous contrast. Multiplanar CT image reconstructions and MIPs were obtained to evaluate the vascular anatomy. Multidetector CT imaging of the abdomen and pelvis was performed using the standard protocol during bolus administration of intravenous contrast. CONTRAST:  143mL OMNIPAQUE IOHEXOL 350 MG/ML SOLN COMPARISON:  PET CT 04/08/2021. FINDINGS: CTA CHEST FINDINGS Cardiovascular: Heart size is upper limits of normal. There is no pericardial effusion. Aorta is ectatic without focal dilatation. There are atherosclerotic calcifications of the aorta and coronary arteries. There is adequate opacification of  the pulmonary arteries. There is significant motion artifact in the right lung base. There are findings suspicious for segmental and subsegmental pulmonary emboli in the right lower lobe. Otherwise, no pulmonary emboli are visualized. Mediastinum/Nodes: No enlarged mediastinal, hilar, or axillary lymph nodes. Thyroid gland, trachea, and esophagus demonstrate no significant  findings. Lungs/Pleura: Again seen is right apical tumor invading the second and third ribs measuring proximally 5.3 x 2.9 cm appears similar to the prior examination. Diffuse multifocal airspace opacities are seen bilaterally most significant in the upper lobes. Patchy right lower lobe airspace disease is similar to the prior study. There is right lower lobe peribronchial thickening, unchanged from prior. There is no pneumothorax. There is a trace right pleural effusion similar to the prior study. There secretions in the right mainstem bronchus. Musculoskeletal: Osseous invasion are right apical mass involving the right second and third ribs appears unchanged from the prior examination. No acute fractures are seen. Multilevel degenerative changes affect the spine. Review of the MIP images confirms the above findings. CT ABDOMEN and PELVIS FINDINGS Hepatobiliary: No focal liver abnormality is seen. Status post cholecystectomy. No biliary dilatation. Pancreas: Again seen is calcification in the pancreatic head. There is diffuse atrophy of the pancreatic neck, body and tail with ductal dilatation similar to the prior study. Scattered pancreatic parenchymal calcifications are seen throughout. Appearance is unchanged from prior. Spleen: Normal in size without focal abnormality. Adrenals/Urinary Tract: There are rounded cortical hypodensities in both kidneys which are too small to characterize, most likely cysts. There is an indeterminate lesion in the superior left kidney measuring 2.4 x 2 point cm image 15/11. There is no hydronephrosis or  perinephric fluid. Bladder and adrenal glands are within normal limits. Stomach/Bowel: Cecum is high riding. Appendix is within normal limits. There is a large amount of stool throughout the colon. There is some mildly dilated small bowel loops in the mid abdomen measuring 3.8 cm without definite transition point. There is no focal bowel wall thickening or inflammation. Stomach is within normal limits. Vascular/Lymphatic: Aorta is normal in size. There are atherosclerotic calcifications of the aorta and iliac arteries. Infrarenal IVC filter present likely containing thrombus. No discrete enlarged lymph nodes identified. Prominent pelvic and perirectal vessels are again noted. Reproductive: Prostate gland is enlarged, unchanged. Other: There is no ascites or focal abdominal wall hernia. There is subcutaneous edema and hematoma posterior to the right side of the sacrum and coccyx measuring 6.3 by 2.8 x 3.8 cm. Musculoskeletal: Mild compression deformity of L3 is unchanged. No acute fractures are identified. Right ischial metastatic lesion has minimally increased in size. Review of the MIP images confirms the above findings. IMPRESSION: 1. Limited by respiratory motion artifact. There are findings suspicious for pulmonary emboli within segmental and subsegmental branches of the right lower lobe. 2. New bilateral multifocal airspace disease throughout the upper lobes concerning for multifocal infection. 3. Stable right lower lobe airspace disease. 4. Stable right apical mass with rib invasion. 5. Secretions in the right mainstem bronchus. 6. Soft tissue hematoma posterior to the sacrum and coccyx. No underlying acute fracture. 7. Right ischial metastatic lesion has mildly increased in size. 8. Indeterminate right renal lesion. Recommend further evaluation with MRI to exclude solid mass. 9. Mildly dilated central small bowel loops, indeterminate. Findings may related to ileus or enteritis. Developing small bowel  obstruction can not be excluded in the appropriate clinical setting. 10.  Aortic Atherosclerosis (ICD10-I70.0). Electronically Signed   By: Ronney Asters M.D.   On: 06/24/2021 16:01   CT Cervical Spine Wo Contrast  Result Date: 06/05/2021 CLINICAL DATA:  Fall.  Neck trauma (Age >= 65y) EXAM: CT CERVICAL SPINE WITHOUT CONTRAST TECHNIQUE: Multidetector CT imaging of the cervical spine was performed without intravenous contrast. Multiplanar CT image reconstructions were also generated. COMPARISON:  03/04/2021 FINDINGS: Alignment:  Normal Skull base and vertebrae: No acute fracture. No primary bone lesion or focal pathologic process. Soft tissues and spinal canal: No prevertebral fluid or swelling. No visible canal hematoma. Disc levels:  Diffuse degenerative disc disease and facet disease. Upper chest: Biapical scarring.  No acute findings Other: None IMPRESSION: Cervical spondylosis.  No acute bony abnormality. Electronically Signed   By: Rolm Baptise M.D.   On: 06/05/2021 15:30   MR ANGIO HEAD WO CONTRAST  Result Date: 06/10/2021 CLINICAL DATA:  88 year old male with metastatic lung cancer (right Pancoast tumor). Fall on Coumadin with supratherapeutic INR (8.8). But CTA suspicious for pulmonary emboli. Bilateral pneumonia. 5 mm para falcine subdural hematoma. Neurologic deficit. EXAM: MRI HEAD WITHOUT CONTRAST MRA HEAD WITHOUT CONTRAST MRA NECK WITHOUT CONTRAST TECHNIQUE: Multiplanar, multiecho pulse sequences of the brain and surrounding structures were obtained without intravenous contrast. Angiographic images of the Circle of Willis were obtained using MRA technique without intravenous contrast. Angiographic images of the neck were obtained using MRA technique without intravenous contrast. Carotid stenosis measurements (when applicable) are obtained utilizing NASCET criteria, using the distal internal carotid diameter as the denominator. COMPARISON:  CT head 06/19/2021. CTA head and neck 0243 hours today.  [place] [facility] 01/11/2021. FINDINGS: MRI HEAD FINDINGS Brain: 3-4 mm right side subdural hematoma with similar sized para falcine component (series 11, image 18). No midline shift. No significant intracranial mass effect. No subarachnoid or intraventricular blood identified. No ventriculomegaly. In the left posterior corona radiata white matter there is a ring like area of abnormal trace diffusion (series 5, image 94) which appears facilitated on ADC and stable from July MRI without enhancement at that time compatible with chronic white matter lacunar infarct. Punctate restricted diffusion in the right periatrial white matter on series 5, image 90 which was also vaguely visible in July and without enhancement. No restricted diffusion suggestive of acute infarction. No contrast administered today. A few scattered chronic microhemorrhages in the brain are more apparent on SWI today versus T2 * imaging in July. Patchy and confluent other cerebral white matter T2 and FLAIR hyperintensity is stable since July. No areas specific for vasogenic edema. No ventriculomegaly or intracranial mass effect. Small chronic left cerebellar infarcts are stable. Stable deep gray matter nuclei and brainstem. Cervicomedullary junction and pituitary are within normal limits. Vascular: Major intracranial vascular flow voids are stable since July. Dominant left vertebral artery. Skull and upper cervical spine: Negative visible cervical spine. Visualized bone marrow signal is within normal limits. Sinuses/Orbits: Stable and negative; small maxillary mucous retention cysts. Other: Mastoids remain clear. Visible internal auditory structures appear normal. MRA NECK FINDINGS Time-of-flight neck MRA images reveal antegrade flow in both cervical carotid and vertebral arteries in the neck and to the skull base. 3 vessel arch configuration visible on series 14. Left vertebral artery appears dominant throughout. No evidence of  vertebral artery stenosis. Irregularity at both carotid bifurcations corresponding to atherosclerosis demonstrated by CTA earlier today. No evidence of hemodynamically significant stenosis on the right. However, distal to the left bulb high-grade left ICA stenosis redemonstrated (series 11, image 82), numerically estimated at 90% by CTA today. The vessel remains patent. And 4 vessel antegrade flow continues to the skull base. MRA HEAD FINDINGS Antegrade flow in the posterior circulation with dominant left vertebral artery which supplies the basilar. Distal right vertebral functionally terminates in PICA as seen by CTA. No distal vertebral or basilar stenosis. Patent AICA and SCA origins. Fetal type bilateral PCA origins with tortuous  posterior communicating arteries. Bilateral PCA branches are within normal limits. Antegrade flow in both ICA siphons. Ectatic appearing siphons with irregularity in keeping with atherosclerosis by CTA. But no significant siphon stenosis. Normal posterior communicating artery origins. Patent carotid termini. Normal MCA and ACA origins. Normal ophthalmic artery origins. Diminutive or absent anterior communicating artery. Mildly tortuous ACA branches are within normal limits. MCA M1 segments, bi/trifurcations and visible left MCA branches are within normal limits. IMPRESSION: 1. No acute intracranial abnormality. And no strong evidence of metastatic disease in the absence of IV contrast. 2. Stable appearance of cerebral small vessel disease since a July MRI. 3. Neck MRA re-demonstrates high-grade stenosis of the cervical Left ICA at the distal bulb as seen on CTA earlier today. 4. No other hemodynamically significant stenosis by MRA head and neck. Electronically Signed   By: Genevie Ann M.D.   On: 06/10/2021 06:27   MR ANGIO NECK WO CONTRAST  Result Date: 06/10/2021 CLINICAL DATA:  88 year old male with metastatic lung cancer (right Pancoast tumor). Fall on Coumadin with supratherapeutic  INR (8.8). But CTA suspicious for pulmonary emboli. Bilateral pneumonia. 5 mm para falcine subdural hematoma. Neurologic deficit. EXAM: MRI HEAD WITHOUT CONTRAST MRA HEAD WITHOUT CONTRAST MRA NECK WITHOUT CONTRAST TECHNIQUE: Multiplanar, multiecho pulse sequences of the brain and surrounding structures were obtained without intravenous contrast. Angiographic images of the Circle of Willis were obtained using MRA technique without intravenous contrast. Angiographic images of the neck were obtained using MRA technique without intravenous contrast. Carotid stenosis measurements (when applicable) are obtained utilizing NASCET criteria, using the distal internal carotid diameter as the denominator. COMPARISON:  CT head 06/08/2021. CTA head and neck 0243 hours today. [place] [facility] 01/11/2021. FINDINGS: MRI HEAD FINDINGS Brain: 3-4 mm right side subdural hematoma with similar sized para falcine component (series 11, image 18). No midline shift. No significant intracranial mass effect. No subarachnoid or intraventricular blood identified. No ventriculomegaly. In the left posterior corona radiata white matter there is a ring like area of abnormal trace diffusion (series 5, image 94) which appears facilitated on ADC and stable from July MRI without enhancement at that time compatible with chronic white matter lacunar infarct. Punctate restricted diffusion in the right periatrial white matter on series 5, image 90 which was also vaguely visible in July and without enhancement. No restricted diffusion suggestive of acute infarction. No contrast administered today. A few scattered chronic microhemorrhages in the brain are more apparent on SWI today versus T2 * imaging in July. Patchy and confluent other cerebral white matter T2 and FLAIR hyperintensity is stable since July. No areas specific for vasogenic edema. No ventriculomegaly or intracranial mass effect. Small chronic left cerebellar infarcts are  stable. Stable deep gray matter nuclei and brainstem. Cervicomedullary junction and pituitary are within normal limits. Vascular: Major intracranial vascular flow voids are stable since July. Dominant left vertebral artery. Skull and upper cervical spine: Negative visible cervical spine. Visualized bone marrow signal is within normal limits. Sinuses/Orbits: Stable and negative; small maxillary mucous retention cysts. Other: Mastoids remain clear. Visible internal auditory structures appear normal. MRA NECK FINDINGS Time-of-flight neck MRA images reveal antegrade flow in both cervical carotid and vertebral arteries in the neck and to the skull base. 3 vessel arch configuration visible on series 14. Left vertebral artery appears dominant throughout. No evidence of vertebral artery stenosis. Irregularity at both carotid bifurcations corresponding to atherosclerosis demonstrated by CTA earlier today. No evidence of hemodynamically significant stenosis on the right. However, distal  to the left bulb high-grade left ICA stenosis redemonstrated (series 11, image 82), numerically estimated at 90% by CTA today. The vessel remains patent. And 4 vessel antegrade flow continues to the skull base. MRA HEAD FINDINGS Antegrade flow in the posterior circulation with dominant left vertebral artery which supplies the basilar. Distal right vertebral functionally terminates in PICA as seen by CTA. No distal vertebral or basilar stenosis. Patent AICA and SCA origins. Fetal type bilateral PCA origins with tortuous posterior communicating arteries. Bilateral PCA branches are within normal limits. Antegrade flow in both ICA siphons. Ectatic appearing siphons with irregularity in keeping with atherosclerosis by CTA. But no significant siphon stenosis. Normal posterior communicating artery origins. Patent carotid termini. Normal MCA and ACA origins. Normal ophthalmic artery origins. Diminutive or absent anterior communicating artery. Mildly  tortuous ACA branches are within normal limits. MCA M1 segments, bi/trifurcations and visible left MCA branches are within normal limits. IMPRESSION: 1. No acute intracranial abnormality. And no strong evidence of metastatic disease in the absence of IV contrast. 2. Stable appearance of cerebral small vessel disease since a July MRI. 3. Neck MRA re-demonstrates high-grade stenosis of the cervical Left ICA at the distal bulb as seen on CTA earlier today. 4. No other hemodynamically significant stenosis by MRA head and neck. Electronically Signed   By: Genevie Ann M.D.   On: 06/10/2021 06:27   MR BRAIN WO CONTRAST  Result Date: 06/10/2021 CLINICAL DATA:  88 year old male with metastatic lung cancer (right Pancoast tumor). Fall on Coumadin with supratherapeutic INR (8.8). But CTA suspicious for pulmonary emboli. Bilateral pneumonia. 5 mm para falcine subdural hematoma. Neurologic deficit. EXAM: MRI HEAD WITHOUT CONTRAST MRA HEAD WITHOUT CONTRAST MRA NECK WITHOUT CONTRAST TECHNIQUE: Multiplanar, multiecho pulse sequences of the brain and surrounding structures were obtained without intravenous contrast. Angiographic images of the Circle of Willis were obtained using MRA technique without intravenous contrast. Angiographic images of the neck were obtained using MRA technique without intravenous contrast. Carotid stenosis measurements (when applicable) are obtained utilizing NASCET criteria, using the distal internal carotid diameter as the denominator. COMPARISON:  CT head 06/08/2021. CTA head and neck 0243 hours today. [facility] MRI 01/11/2021. FINDINGS: MRI HEAD FINDINGS Brain: 3-4 mm right side subdural hematoma with similar sized para falcine component (series 11, image 18). No midline shift. No significant intracranial mass effect. No subarachnoid or intraventricular blood identified. No ventriculomegaly. In the left posterior corona radiata white matter there is a ring like area of abnormal  trace diffusion (series 5, image 94) which appears facilitated on ADC and stable from July MRI without enhancement at that time compatible with chronic white matter lacunar infarct. Punctate restricted diffusion in the right periatrial white matter on series 5, image 90 which was also vaguely visible in July and without enhancement. No restricted diffusion suggestive of acute infarction. No contrast administered today. A few scattered chronic microhemorrhages in the brain are more apparent on SWI today versus T2 * imaging in July. Patchy and confluent other cerebral white matter T2 and FLAIR hyperintensity is stable since July. No areas specific for vasogenic edema. No ventriculomegaly or intracranial mass effect. Small chronic left cerebellar infarcts are stable. Stable deep gray matter nuclei and brainstem. Cervicomedullary junction and pituitary are within normal limits. Vascular: Major intracranial vascular flow voids are stable since July. Dominant left vertebral artery. Skull and upper cervical spine: Negative visible cervical spine. Visualized bone marrow signal is within normal limits. Sinuses/Orbits: Stable and negative; small maxillary mucous retention  cysts. Other: Mastoids remain clear. Visible internal auditory structures appear normal. MRA NECK FINDINGS Time-of-flight neck MRA images reveal antegrade flow in both cervical carotid and vertebral arteries in the neck and to the skull base. 3 vessel arch configuration visible on series 14. Left vertebral artery appears dominant throughout. No evidence of vertebral artery stenosis. Irregularity at both carotid bifurcations corresponding to atherosclerosis demonstrated by CTA earlier today. No evidence of hemodynamically significant stenosis on the right. However, distal to the left bulb high-grade left ICA stenosis redemonstrated (series 11, image 82), numerically estimated at 90% by CTA today. The vessel remains patent. And 4 vessel antegrade flow  continues to the skull base. MRA HEAD FINDINGS Antegrade flow in the posterior circulation with dominant left vertebral artery which supplies the basilar. Distal right vertebral functionally terminates in PICA as seen by CTA. No distal vertebral or basilar stenosis. Patent AICA and SCA origins. Fetal type bilateral PCA origins with tortuous posterior communicating arteries. Bilateral PCA branches are within normal limits. Antegrade flow in both ICA siphons. Ectatic appearing siphons with irregularity in keeping with atherosclerosis by CTA. But no significant siphon stenosis. Normal posterior communicating artery origins. Patent carotid termini. Normal MCA and ACA origins. Normal ophthalmic artery origins. Diminutive or absent anterior communicating artery. Mildly tortuous ACA branches are within normal limits. MCA M1 segments, bi/trifurcations and visible left MCA branches are within normal limits. IMPRESSION: 1. No acute intracranial abnormality. And no strong evidence of metastatic disease in the absence of IV contrast. 2. Stable appearance of cerebral small vessel disease since a July MRI. 3. Neck MRA re-demonstrates high-grade stenosis of the cervical Left ICA at the distal bulb as seen on CTA earlier today. 4. No other hemodynamically significant stenosis by MRA head and neck. Electronically Signed   By: Genevie Ann M.D.   On: 06/10/2021 06:27   CT ABDOMEN PELVIS W CONTRAST  Result Date: 06/07/2021 CLINICAL DATA:  Lung cancer.  Prostate cancer.  Fall. EXAM: CT ANGIOGRAPHY CHEST CT ABDOMEN AND PELVIS WITH CONTRAST TECHNIQUE: He has been hiding her in the CT imaging of the chest was performed using the standard protocol during bolus administration of intravenous contrast. Multiplanar CT image reconstructions and MIPs were obtained to evaluate the vascular anatomy. Multidetector CT imaging of the abdomen and pelvis was performed using the standard protocol during bolus administration of intravenous contrast.  CONTRAST:  133mL OMNIPAQUE IOHEXOL 350 MG/ML SOLN COMPARISON:  PET CT 04/08/2021. FINDINGS: CTA CHEST FINDINGS Cardiovascular: Heart size is upper limits of normal. There is no pericardial effusion. Aorta is ectatic without focal dilatation. There are atherosclerotic calcifications of the aorta and coronary arteries. There is adequate opacification of the pulmonary arteries. There is significant motion artifact in the right lung base. There are findings suspicious for segmental and subsegmental pulmonary emboli in the right lower lobe. Otherwise, no pulmonary emboli are visualized. Mediastinum/Nodes: No enlarged mediastinal, hilar, or axillary lymph nodes. Thyroid gland, trachea, and esophagus demonstrate no significant findings. Lungs/Pleura: Again seen is right apical tumor invading the second and third ribs measuring proximally 5.3 x 2.9 cm appears similar to the prior examination. Diffuse multifocal airspace opacities are seen bilaterally most significant in the upper lobes. Patchy right lower lobe airspace disease is similar to the prior study. There is right lower lobe peribronchial thickening, unchanged from prior. There is no pneumothorax. There is a trace right pleural effusion similar to the prior study. There secretions in the right mainstem bronchus. Musculoskeletal: Osseous invasion are right apical mass involving the  right second and third ribs appears unchanged from the prior examination. No acute fractures are seen. Multilevel degenerative changes affect the spine. Review of the MIP images confirms the above findings. CT ABDOMEN and PELVIS FINDINGS Hepatobiliary: No focal liver abnormality is seen. Status post cholecystectomy. No biliary dilatation. Pancreas: Again seen is calcification in the pancreatic head. There is diffuse atrophy of the pancreatic neck, body and tail with ductal dilatation similar to the prior study. Scattered pancreatic parenchymal calcifications are seen throughout. Appearance  is unchanged from prior. Spleen: Normal in size without focal abnormality. Adrenals/Urinary Tract: There are rounded cortical hypodensities in both kidneys which are too small to characterize, most likely cysts. There is an indeterminate lesion in the superior left kidney measuring 2.4 x 2 point cm image 15/11. There is no hydronephrosis or perinephric fluid. Bladder and adrenal glands are within normal limits. Stomach/Bowel: Cecum is high riding. Appendix is within normal limits. There is a large amount of stool throughout the colon. There is some mildly dilated small bowel loops in the mid abdomen measuring 3.8 cm without definite transition point. There is no focal bowel wall thickening or inflammation. Stomach is within normal limits. Vascular/Lymphatic: Aorta is normal in size. There are atherosclerotic calcifications of the aorta and iliac arteries. Infrarenal IVC filter present likely containing thrombus. No discrete enlarged lymph nodes identified. Prominent pelvic and perirectal vessels are again noted. Reproductive: Prostate gland is enlarged, unchanged. Other: There is no ascites or focal abdominal wall hernia. There is subcutaneous edema and hematoma posterior to the right side of the sacrum and coccyx measuring 6.3 by 2.8 x 3.8 cm. Musculoskeletal: Mild compression deformity of L3 is unchanged. No acute fractures are identified. Right ischial metastatic lesion has minimally increased in size. Review of the MIP images confirms the above findings. IMPRESSION: 1. Limited by respiratory motion artifact. There are findings suspicious for pulmonary emboli within segmental and subsegmental branches of the right lower lobe. 2. New bilateral multifocal airspace disease throughout the upper lobes concerning for multifocal infection. 3. Stable right lower lobe airspace disease. 4. Stable right apical mass with rib invasion. 5. Secretions in the right mainstem bronchus. 6. Soft tissue hematoma posterior to the  sacrum and coccyx. No underlying acute fracture. 7. Right ischial metastatic lesion has mildly increased in size. 8. Indeterminate right renal lesion. Recommend further evaluation with MRI to exclude solid mass. 9. Mildly dilated central small bowel loops, indeterminate. Findings may related to ileus or enteritis. Developing small bowel obstruction can not be excluded in the appropriate clinical setting. 10.  Aortic Atherosclerosis (ICD10-I70.0). Electronically Signed   By: Ronney Asters M.D.   On: 06/02/2021 16:01   DG CHEST PORT 1 VIEW  Result Date: 07/17/21 CLINICAL DATA:  Respiratory distress. EXAM: PORTABLE CHEST 1 VIEW COMPARISON:  06/21/2021. FINDINGS: The cardiac borders are obscured. There is complete opacification of the right lung. Patchy airspace disease is noted in the left lung. There is consolidation in the left upper lobe. No pneumothorax is seen. The bony structures are stable. IMPRESSION: 1. Complete opacification of the right lung, possible moderate-to-large pleural effusion with atelectasis or infiltrate. 2. Patchy airspace disease in the left lung with consolidation in the left upper lobe, significantly worsened from the prior exam. Electronically Signed   By: Brett Fairy M.D.   On: 07/17/2021 04:43   DG CHEST PORT 1 VIEW  Result Date: 06/21/2021 CLINICAL DATA:  Dyspnea EXAM: PORTABLE CHEST 1 VIEW COMPARISON:  06/20/2021 at 7:43 a.m. FINDINGS: Moderate right pleural  effusion is stable. Superimposed right basilar collapse with right-sided volume loss and mediastinal shift to the right is unchanged. Bilateral upper lung zone airspace infiltrate persists, stable since prior examination, likely infectious in etiology. No pneumothorax. Cardiac size within normal limits. IMPRESSION: Stable examination. Stable moderate right pleural effusion. Superimposed right basilar collapse and resultant right-sided volume loss. Stable upper lung zone pulmonary infiltrates, likely infectious.  Electronically Signed   By: Fidela Salisbury M.D.   On: 06/21/2021 05:08   DG CHEST PORT 1 VIEW  Result Date: 06/20/2021 CLINICAL DATA:  Oxygen desaturation EXAM: PORTABLE CHEST 1 VIEW COMPARISON:  06/17/2021 FINDINGS: Worsening findings in the right chest. Further pleural fluid accumulation and volume loss of the right lung. Aerated portion of the right upper lung shows abnormal density consistent with pneumonia. Left lower lung is largely clear. Persistent infiltrate in the left upper lobe. IMPRESSION: Worsening pleural fluid, volume loss and infiltrate in the right lung. Persistent left upper lobe pneumonia. Electronically Signed   By: Nelson Chimes M.D.   On: 06/20/2021 07:53   DG CHEST PORT 1 VIEW  Result Date: 06/15/2021 CLINICAL DATA:  Pneumonia EXAM: PORTABLE CHEST 1 VIEW COMPARISON:  06/13/2021 FINDINGS: Interval improvement in aeration of the left lung. Significant opacities still remains in the medial left upper lobe. Near complete opacification of the right thorax is not significantly changed since prior examination. IMPRESSION: Interval improvement of aeration of the left lung with airspace opacities still remaining in the medial upper lobe. Unchanged near complete opacification of the right hemithorax. Electronically Signed   By: Miachel Roux M.D.   On: 06/15/2021 08:37   DG Chest Port 1 View  Result Date: 06/13/2021 CLINICAL DATA:  88 year old male with history of abnormal respiration. EXAM: PORTABLE CHEST - 1 VIEW COMPARISON:  06/12/2021, 06/11/2021 FINDINGS: The patient is rotated to the right. Similar appearing obscuration of the right cardiomediastinal silhouette and right costophrenic angle. Slight interval increased near complete opacification of the right hemithorax with scattered aeration of the right upper lobe. Slight interval improvement and scattered hazy opacities most prominent about the left hilum. No evidence of pneumothorax. Similar appearing erosive changes of the  proximal right second and third ribs. No acute osseous abnormality. IMPRESSION: 1. Slight interval worsening of near complete opacification of the right hemithorax, likely partially due to at least a moderate right pleural effusion. 2. Slight interval improved aeration of the left lung with scattered persistent hazy perihilar opacities. Electronically Signed   By: Ruthann Cancer M.D.   On: 06/13/2021 08:05   DG Chest Port 1 View  Result Date: 06/12/2021 CLINICAL DATA:  Abnormal respiration EXAM: PORTABLE CHEST 1 VIEW COMPARISON:  Prior chest x-ray 06/11/2021 FINDINGS: The patient has been extubated. Lower lung volumes, particularly on the right with near-total atelectasis and/or opacification of the right lung. Extensive bilateral upper lung predominant irregular and interstitial airspace opacities. Right apical mass with destruction of the right second and third ribs again noted. No pneumothorax. IMPRESSION: Significantly diminished aeration, particularly on the right following extubation. Diffuse bilateral interstitial and airspace opacities with upper lung predominance again noted concerning for multifocal pneumonia. Right apical mass with osseous destruction of the posterolateral aspects of the right second and third rib again noted. Electronically Signed   By: Jacqulynn Cadet M.D.   On: 06/12/2021 07:57   DG CHEST PORT 1 VIEW  Result Date: 06/11/2021 CLINICAL DATA:  Abnormal respirations EXAM: PORTABLE CHEST 1 VIEW COMPARISON:  Chest x-ray 06/10/2021 FINDINGS: Endotracheal tube tip is 3.6 cm  above the carina. Cardiomediastinal silhouette appears grossly stable. Persistent ill-defined ground-glass airspace infiltrates bilaterally, upper lobe predominant, with slightly improved aeration on the right since previous study. No significant pleural effusion. No pneumothorax. IMPRESSION: 1. Medical devices as described. 2. Persistent bilateral infiltrates with mild improved aeration on the right since  previous study. Electronically Signed   By: Ofilia Neas M.D.   On: 06/11/2021 10:24   DG CHEST PORT 1 VIEW  Result Date: 06/10/2021 CLINICAL DATA:  Intubation EXAM: PORTABLE CHEST 1 VIEW COMPARISON:  Radiograph 06/17/2021 FINDINGS: Endotracheal tube tip overlies the midthoracic trachea. Unchanged cardiomediastinal silhouette. No significant change in bilateral airspace disease, most prominent in the upper lungs, in part related to a right apical mass with right second and third rib invasion. Trace pleural effusions. No visible pneumothorax. Bones are unchanged. IMPRESSION: Endotracheal tube overlies the midthoracic trachea. Unchanged multifocal pneumonia and right apical mass with rib invasion. Electronically Signed   By: Maurine Simmering M.D.   On: 06/10/2021 09:53   DG Chest Portable 1 View  Result Date: 06/24/2021 CLINICAL DATA:  Trauma, fall EXAM: PORTABLE CHEST 1 VIEW COMPARISON:  Images of previous study done on 12/30/2020 are not available for comparison. FINDINGS: Transverse diameter of heart is within normal limits. Thoracic aorta is tortuous and ectatic. There is decreased volume in right lung. Increased interstitial markings are seen in both upper lung fields. There is crowding of markings in the lower lung fields. There is no significant pleural effusion or pneumothorax. IMPRESSION: Increased interstitial markings are seen in both lungs more so in both upper lung fields. There is decreased volume in right lung. Findings may suggest scarring from fibrosis. Possibility of superimposed pneumonia is not excluded. Electronically Signed   By: Elmer Picker M.D.   On: 06/26/2021 13:37   DG Swallowing Func-Speech Pathology  Result Date: 06/13/2021 Table formatting from the original result was not included. Objective Swallowing Evaluation: Type of Study: MBS-Modified Barium Swallow Study  Patient Details Name: Adrian Foster MRN: [id number] Date of Birth: 01/01/1934 Today's Date: 06/13/2021 Time:  SLP Start Time (ACUTE ONLY): 0845 -SLP Stop Time (ACUTE ONLY): 0905 SLP Time Calculation (min) (ACUTE ONLY): 20 min Past Medical History: Past Medical History: Diagnosis Date  Diabetes mellitus without complication ([place])   DVT (deep venous thrombosis) ([facility])   Hypertension   MI (myocardial infarction) ([place])   Pulmonary embolism ([place])  Past Surgical History: Past Surgical History: Procedure Laterality Date  CHOLECYSTECTOMY    REPLACEMENT TOTAL KNEE BILATERAL    VENA CAVA FILTER PLACEMENT   HPI: Patient is a 88 yo M w/ pertinent pmh of CAD s/p MI, DVT/PE on Coumadin, T2DM, HTN, metastatic prostate cancer w/ mets to lungs on active radiation presents to [facility] on 12/8 after mechanical fall.  CT head showed 5 mm parafalcine subdural hematoma. CXR shows possible RLL pneumonia; given ceftriaxone/azithromycin. Rn has observed pt to cough with large sips of water.  No data recorded  Recommendations for follow up therapy are one component of a multi-disciplinary discharge planning process, led by the attending physician.  Recommendations may be updated based on patient status, additional functional criteria and insurance authorization. Assessment / Plan / Recommendation Clinical Impressions 06/13/2021 Clinical Impression Pt demonstrates a moderate dysphagia, appearing to be exacerbated by lethargy and weakness.Oral phase is adequate, but there is a slight delay in swallow initiation and full airway protection as well as weakness of base of tongue. Initially pt tolerated straw sips of thin well, but one large sip resulted in spillage  into the airway with weak cough response that pt never fully cleared and subsequent attempts to regulate bolus size still resulted in further penetration events that contributed to barium in the vestibule and airway. Nectar thick liquids given in smaller sips did not result in penetration or aspiration, though vallecular residual with nectar and solids were present. A cued effortful swallow or second  swallow benefitted pt. Recommend regular solids and nectar thick liquids during rehabilitation. Pharyngeal strengthening and respiratory muscle strength training for more effective cough may be beneficial as well as behavioral strategies to limit to single cup sips. Recommend AIR at d/c for intensive rehabiltation and eventual diet upgrade. SLP Visit Diagnosis Dysphagia, pharyngeal phase (R13.13) Attention and concentration deficit following -- Frontal lobe and executive function deficit following -- Impact on safety and function Moderate aspiration risk   Treatment Recommendations 06/13/2021 Treatment Recommendations Therapy as outlined in treatment plan below   Prognosis 06/13/2021 Prognosis for Safe Diet Advancement Good Barriers to Reach Goals Cognitive deficits Barriers/Prognosis Comment -- Diet Recommendations 06/13/2021 SLP Diet Recommendations Regular solids;Nectar thick liquid Liquid Administration via Cup;No straw Medication Administration Whole meds with puree Compensations Slow rate;Small sips/bites;Effortful swallow;Multiple dry swallows after each bite/sip Postural Changes Remain semi-upright after after feeds/meals (Comment);Seated upright at 90 degrees   Other Recommendations 06/13/2021 Recommended Consults -- Oral Care Recommendations Oral care BID Other Recommendations -- Follow Up Recommendations Acute inpatient rehab (3hours/day) Assistance recommended at discharge Frequent or constant Supervision/Assistance Functional Status Assessment Patient has had a recent decline in their functional status and demonstrates the ability to make significant improvements in function in a reasonable and predictable amount of time. Frequency and Duration  06/13/2021 Speech Therapy Frequency (ACUTE ONLY) min 2x/week Treatment Duration 2 weeks   Oral Phase 06/13/2021 Oral Phase WFL Oral - Pudding Teaspoon -- Oral - Pudding Cup -- Oral - Honey Teaspoon -- Oral - Honey Cup -- Oral - Nectar Teaspoon -- Oral - Nectar  Cup -- Oral - Nectar Straw -- Oral - Thin Teaspoon -- Oral - Thin Cup -- Oral - Thin Straw -- Oral - Puree -- Oral - Mech Soft -- Oral - Regular -- Oral - Multi-Consistency -- Oral - Pill -- Oral Phase - Comment --  Pharyngeal Phase 06/13/2021 Pharyngeal Phase Impaired Pharyngeal- Pudding Teaspoon -- Pharyngeal -- Pharyngeal- Pudding Cup -- Pharyngeal -- Pharyngeal- Honey Teaspoon -- Pharyngeal -- Pharyngeal- Honey Cup -- Pharyngeal -- Pharyngeal- Nectar Teaspoon Reduced tongue base retraction;Pharyngeal residue - valleculae Pharyngeal -- Pharyngeal- Nectar Cup -- Pharyngeal -- Pharyngeal- Nectar Straw Reduced tongue base retraction;Pharyngeal residue - valleculae Pharyngeal -- Pharyngeal- Thin Teaspoon -- Pharyngeal -- Pharyngeal- Thin Cup Reduced tongue base retraction;Pharyngeal residue - valleculae;Penetration/Aspiration before swallow;Delayed swallow initiation-pyriform sinuses Pharyngeal Material enters airway, remains ABOVE vocal cords and not ejected out;Material does not enter airway Pharyngeal- Thin Straw Penetration/Aspiration before swallow;Delayed swallow initiation-pyriform sinuses;Pharyngeal residue - valleculae;Reduced tongue base retraction Pharyngeal Material enters airway, passes BELOW cords and not ejected out despite cough attempt by patient;Material does not enter airway Pharyngeal- Puree -- Pharyngeal -- Pharyngeal- Mechanical Soft -- Pharyngeal -- Pharyngeal- Regular -- Pharyngeal -- Pharyngeal- Multi-consistency -- Pharyngeal -- Pharyngeal- Pill -- Pharyngeal -- Pharyngeal Comment --  No flowsheet data found. DeBlois, Katherene Ponto 06/13/2021, 10:06 AM                     EEG adult  Result Date: 06/10/2021 Lora Havens, MD     06/10/2021  4:48 PM Patient Name: Adrian Foster MRN: [id number] Epilepsy Attending:  Lora Havens Referring Physician/Provider: Dr Zeb Comfort Date: 06/10/2021 Duration: 22.20 mins Patient history:  88 year old male with transient episode of decreased  responsiveness in the setting of a parafalcine subdural hematoma. EEG to evaluate for seizure Level of alertness: Awake, asleep AEDs during EEG study: None Technical aspects: This EEG study was done with scalp electrodes positioned according to the 10-20 International system of electrode placement. Electrical activity was acquired at a sampling rate of 500Hz  and reviewed with a high frequency filter of 70Hz  and a low frequency filter of 1Hz . EEG data were recorded continuously and digitally stored. Description: The posterior dominant rhythm consists of 8 Hz activity of moderate voltage (25-35 uV) seen predominantly in posterior head regions, symmetric and reactive to eye opening and eye closing. Sleep was characterized by vertex waves, sleep spindles (12 to 14 Hz), maximal frontocentral region. EEG showed intermittent generalized 3 to 6 Hz theta-delta slowing. Hyperventilation and photic stimulation were not performed.   ABNORMALITY - Intermittent slow, generalized IMPRESSION: This study is suggestive of mild diffuse encephalopathy, nonspecific etiology. No seizures or epileptiform discharges were seen throughout the recording. Priyanka Barbra Sarks   Overnight EEG with video  Result Date: 06/14/2021 Lora Havens, MD     06/15/2021  8:47 AM Patient Name: Adrian Foster MRN: [id number] Epilepsy Attending: Lora Havens Referring Physician/Provider: Dr Zeb Comfort Duration: 06/13/2021 1241 to 06/14/2021 1241  Patient history:  88 year old male with transient episode of decreased responsiveness in the setting of a parafalcine subdural hematoma. EEG to evaluate for seizure  Level of alertness: Awake, asleep  AEDs during EEG study: LCM  Technical aspects: This EEG study was done with scalp electrodes positioned according to the 10-20 International system of electrode placement. Electrical activity was acquired at a sampling rate of 500Hz  and reviewed with a high frequency filter of 70Hz  and a low frequency  filter of 1Hz . EEG data were recorded continuously and digitally stored.  Description: The posterior dominant rhythm consists of 8-9 Hz activity of moderate voltage (25-35 uV) seen predominantly in posterior head regions, symmetric and reactive to eye opening and eye closing. Sleep was characterized by vertex waves, sleep spindles (12 to 14 Hz), maximal frontocentral region. EEG showed continuous generalized 3 to 6 Hz theta-delta slowing, at times with triphasic morphology. Hyperventilation and photic stimulation were not performed.    ABNORMALITY -Continuous slow, generalized  IMPRESSION: This study is suggestive of moderate diffuse encephalopathy, nonspecific etiology but could be secondary to toxic -metabolic causes. No seizures or definite epileptiform discharges were seen throughout the recording.  Lora Havens   ECHOCARDIOGRAM COMPLETE  Result Date: 06/22/2021    ECHOCARDIOGRAM REPORT   Patient Name:   Adrian Foster Date of Exam: 06/22/2021 Medical Rec #:  [id number]     Height:       75.0 in Accession #:    [id number]    Weight:       266.3 lb Date of Birth:  06-May-1934     BSA:          2.478 m Patient Age:    54 years      BP:           105/61 mmHg Patient Gender: M             HR:           105 bpm. Exam Location:  Inpatient Procedure: 2D Echo, Cardiac Doppler and Color Doppler Indications:    Dyspnea  History:  Patient has no prior history of Echocardiogram examinations.  Sonographer:    Glo Herring Referring Phys: [id number] [place]  1. Left ventricular ejection fraction, by estimation, is 60 to 65%. The left ventricle has normal function. The left ventricle has no regional wall motion abnormalities. Indeterminate diastolic filling due to E-A fusion.  2. Right ventricular systolic function is normal. The right ventricular size is normal.  3. The mitral valve is normal in structure. No evidence of mitral valve regurgitation. No evidence of mitral stenosis.  4. The aortic  valve is normal in structure. There is mild calcification of the aortic valve. There is mild thickening of the aortic valve. Aortic valve regurgitation is not visualized. Mild aortic valve stenosis.  5. The inferior vena cava is normal in size with greater than 50% respiratory variability, suggesting right atrial pressure of 3 mmHg. FINDINGS  Left Ventricle: Left ventricular ejection fraction, by estimation, is 60 to 65%. The left ventricle has normal function. The left ventricle has no regional wall motion abnormalities. The left ventricular internal cavity size was normal in size. There is  no left ventricular hypertrophy. Indeterminate diastolic filling due to E-A fusion. Right Ventricle: The right ventricular size is normal. No increase in right ventricular wall thickness. Right ventricular systolic function is normal. Left Atrium: Left atrial size was normal in size. Right Atrium: Right atrial size was normal in size. Pericardium: There is no evidence of pericardial effusion. Mitral Valve: The mitral valve is normal in structure. No evidence of mitral valve regurgitation. No evidence of mitral valve stenosis. Tricuspid Valve: The tricuspid valve is normal in structure. Tricuspid valve regurgitation is not demonstrated. No evidence of tricuspid stenosis. Aortic Valve: The aortic valve is normal in structure. There is mild calcification of the aortic valve. There is mild thickening of the aortic valve. Aortic valve regurgitation is not visualized. Mild aortic stenosis is present. Aortic valve mean gradient measures 8.3 mmHg. Aortic valve peak gradient measures 16.5 mmHg. Aortic valve area, by VTI measures 2.03 cm. Pulmonic Valve: The pulmonic valve was normal in structure. Pulmonic valve regurgitation is not visualized. No evidence of pulmonic stenosis. Aorta: The aortic root is normal in size and structure. Venous: The inferior vena cava is normal in size with greater than 50% respiratory variability,  suggesting right atrial pressure of 3 mmHg. IAS/Shunts: No atrial level shunt detected by color flow Doppler.  LEFT VENTRICLE PLAX 2D LVIDd:         5.40 cm LVIDs:         3.00 cm LV PW:         1.00 cm LV IVS:        1.00 cm LVOT diam:     2.36 cm LV SV:         75 LV SV Index:   30 LVOT Area:     4.37 cm  RIGHT VENTRICLE RV S prime:     14.90 cm/s LEFT ATRIUM             Index LA diam:        4.20 cm 1.69 cm/m LA Vol (A2C):   77.7 ml 31.35 ml/m LA Vol (A4C):   61.0 ml 24.61 ml/m LA Biplane Vol: 69.0 ml 27.84 ml/m  AORTIC VALVE AV Area (Vmax):    1.97 cm AV Area (Vmean):   1.96 cm AV Area (VTI):     2.03 cm AV Vmax:           203.00 cm/s AV  Vmean:          132.667 cm/s AV VTI:            0.371 m AV Peak Grad:      16.5 mmHg AV Mean Grad:      8.3 mmHg LVOT Vmax:         91.33 cm/s LVOT Vmean:        59.300 cm/s LVOT VTI:          0.172 m LVOT/AV VTI ratio: 0.46  AORTA Ao Root diam: 3.70 cm  SHUNTS Systemic VTI:  0.17 m Systemic Diam: 2.36 cm Dani Gobble Croitoru MD Electronically signed by Sanda Klein MD Signature Date/Time: 06/22/2021/1:14:35 PM    Final     Microbiology Recent Results (from the past 240 hour(s))  MRSA Next Gen by PCR, Nasal     Status: None   Collection Time: 06/19/21  6:02 PM   Specimen: Nasal Mucosa; Nasal Swab  Result Value Ref Range Status   MRSA by PCR Next Gen NOT DETECTED NOT DETECTED Final    Comment: (NOTE) The GeneXpert MRSA Assay (FDA approved for NASAL specimens only), is one component of a comprehensive MRSA colonization surveillance program. It is not intended to diagnose MRSA infection nor to guide or monitor treatment for MRSA infections. Test performance is not FDA approved in patients less than 53 years old. Performed at [facility], [place] [address], [place], [place] [address]     Lab Basic Metabolic Panel: Recent Labs  Lab 06/19/21 [phone] 06/20/21 0711 06/21/21 0334 06/22/21 0312 June 30, 2021 0229  NA 144 145 144 144 144  K 3.9 3.9 3.3* 3.4*  4.1  CL 98 97* 94* 97* 97*  CO2 42* >45* >45* 44* 45*  GLUCOSE 107* 106* 130* 120* 144*  BUN 21 24* 22 23 22   CREATININE 0.61 0.61 0.58* 0.71 0.72  CALCIUM 9.2 9.0 8.9 8.8* 9.1   Liver Function Tests: No results for input(s): AST, ALT, ALKPHOS, BILITOT, PROT, ALBUMIN in the last 168 hours. No results for input(s): LIPASE, AMYLASE in the last 168 hours. No results for input(s): AMMONIA in the last 168 hours. CBC: Recent Labs  Lab 06/19/21 0711 06/20/21 0711 06/21/21 0334 06/22/21 0312 06-30-2021 0229  WBC 6.8 5.3   5.1 5.6 5.7 8.2  NEUTROABS  --  4.6 4.9 5.0 7.3  HGB 9.1* 8.8*   8.5* 8.4* 8.1* 9.7*  HCT 32.7* 31.1*   31.0* 29.4* 29.2* 35.2*  MCV 103.5* 105.4*   105.8* 103.2* 102.8* 105.4*  PLT 208 214   209 199 188 231   Cardiac Enzymes: No results for input(s): CKTOTAL, CKMB, CKMBINDEX, TROPONINI in the last 168 hours. Sepsis Labs: Recent Labs  Lab 06/20/21 0711 06/21/21 0334 06/22/21 0312 Jun 30, 2021 0229  PROCALCITON 21.03 <0.10 <0.10  --   WBC 5.3   5.1 5.6 5.7 8.2    Procedures/Operations     Kalei Mckillop 06/24/2021, 7:49 AM

## 2021-07-03 NOTE — Progress Notes (Signed)
RT drew ABG per order.  ABG sent to lab awaiting results.  Patient placed back on BiPAP.  14/8, BUR 16, 100% FiO2. RT will continue to monitor.

## 2021-07-03 NOTE — Progress Notes (Signed)
Patient switched to [place] from non-rebreather, Morphine and Robinul given IV per orders for comfort, family at bedside.

## 2021-07-03 NOTE — Progress Notes (Signed)
Pt is much more alert this afternoon on BiPap, alert to self, follows commands, RN able to complete neuro assessment, see flowsheet for details.   Pt wanted to try thickened fluids PO, he did well with drinking but still continues to desat fairly quickly if off the BiPap for a few minutes.   Family is supportive at bedside.

## 2021-07-03 NOTE — Progress Notes (Signed)
Pt placed on 15L NRB to be able to have opportunity to spend time with family at this time.

## 2021-07-03 NOTE — Significant Event (Signed)
Event from last 24 hours and last night noted. Patient was put back on BiPAP yesterday afternoon.  He took a break in the evening and was placed on NRB at bedtime. His mental status worsened and required more oxygen for saturation. Blood gas was done last night with PCO2 level noted to be more than 120 He was placed back on BiPAP. ICU consulted Visited by family members  At the time of my evaluation this morning, patient remained on BiPAP,.  Tries to open eyes on command.  Wife and 2 daughters at bedside. We had an extensive discussion about patient's critical condition and probably need of intubation again.  Family is very reasonable, understanding and can see his progressive decline and poor prognosis despite aggressive measures. They do not want patient to get CPR or intubation.  I updated CODE STATUS to DNR/DNI. They are expecting his son to come by 1 PM. They hope patient does not pass away by that time. If patient wakes up by the time, we along with family members will talk to him if he would ever want to go back on BiPAP again.  If patient's clinical status does not improve, family is free to make a decision of comfort care at any time. We will continue to reassess him throughout the day. Discussed with RN Melissa and ICU attending Dr. Gala Murdoch.

## 2021-07-03 NOTE — Progress Notes (Signed)
SLP Cancellation Note  Patient Details Name: Cadin Luka MRN: [id number] DOB: March 01, 1934   Cancelled treatment:    Events of last night noted. SLP will follow along with chart and hold on interventions.  Ryin Schillo L. Tivis Ringer, [place] Office number [phone] Pager [phone]        Assunta Curtis 06-28-21, 9:57 AM

## 2021-07-03 DEATH — deceased
